# Patient Record
Sex: Female | Born: 1950 | Race: White | Hispanic: No | Marital: Married | Smoking: Current every day smoker
Health system: Southern US, Community
[De-identification: ages and names within clinical notes are randomized; demographics above are authoritative.]

## PROBLEM LIST (undated history)

## (undated) DIAGNOSIS — K743 Primary biliary cirrhosis: Secondary | ICD-10-CM

## (undated) DIAGNOSIS — R399 Unspecified symptoms and signs involving the genitourinary system: Secondary | ICD-10-CM

## (undated) DIAGNOSIS — T8859XA Other complications of anesthesia, initial encounter: Secondary | ICD-10-CM

## (undated) DIAGNOSIS — K589 Irritable bowel syndrome without diarrhea: Secondary | ICD-10-CM

## (undated) DIAGNOSIS — J302 Other seasonal allergic rhinitis: Secondary | ICD-10-CM

## (undated) DIAGNOSIS — E8881 Metabolic syndrome: Secondary | ICD-10-CM

## (undated) DIAGNOSIS — M1990 Unspecified osteoarthritis, unspecified site: Secondary | ICD-10-CM

## (undated) DIAGNOSIS — G4733 Obstructive sleep apnea (adult) (pediatric): Secondary | ICD-10-CM

## (undated) DIAGNOSIS — G43909 Migraine, unspecified, not intractable, without status migrainosus: Secondary | ICD-10-CM

## (undated) DIAGNOSIS — K769 Liver disease, unspecified: Secondary | ICD-10-CM

## (undated) DIAGNOSIS — T4145XA Adverse effect of unspecified anesthetic, initial encounter: Secondary | ICD-10-CM

## (undated) DIAGNOSIS — K5790 Diverticulosis of intestine, part unspecified, without perforation or abscess without bleeding: Secondary | ICD-10-CM

## (undated) HISTORY — PX: TONSILLECTOMY: SUR1361

## (undated) HISTORY — PX: DILATION AND CURETTAGE OF UTERUS: SHX78

## (undated) HISTORY — DX: Metabolic syndrome: E88.81

## (undated) HISTORY — PX: VULVA SURGERY: SHX837

## (undated) HISTORY — DX: Diverticulosis of intestine, part unspecified, without perforation or abscess without bleeding: K57.90

## (undated) HISTORY — DX: Irritable bowel syndrome, unspecified: K58.9

## (undated) HISTORY — PX: BREAST EXCISIONAL BIOPSY: SUR124

## (undated) HISTORY — DX: Migraine, unspecified, not intractable, without status migrainosus: G43.909

## (undated) HISTORY — DX: Metabolic syndrome: E88.810

## (undated) HISTORY — DX: Obstructive sleep apnea (adult) (pediatric): G47.33

## (undated) HISTORY — DX: Primary biliary cirrhosis: K74.3

## (undated) HISTORY — PX: COLONOSCOPY: SHX174

## (undated) HISTORY — DX: Liver disease, unspecified: K76.9

## (undated) HISTORY — DX: Other seasonal allergic rhinitis: J30.2

---

## 1979-02-28 HISTORY — PX: OTHER SURGICAL HISTORY: SHX169

## 1986-02-27 HISTORY — PX: WRIST SURGERY: SHX841

## 1988-02-28 HISTORY — PX: KNEE ARTHROSCOPY: SUR90

## 1998-11-16 ENCOUNTER — Ambulatory Visit (HOSPITAL_COMMUNITY): Admission: RE | Admit: 1998-11-16 | Discharge: 1998-11-16 | Payer: Self-pay | Admitting: Internal Medicine

## 1998-11-16 ENCOUNTER — Encounter: Payer: Self-pay | Admitting: Internal Medicine

## 1999-03-17 ENCOUNTER — Other Ambulatory Visit: Admission: RE | Admit: 1999-03-17 | Discharge: 1999-03-17 | Payer: Self-pay | Admitting: Obstetrics and Gynecology

## 1999-11-17 ENCOUNTER — Ambulatory Visit (HOSPITAL_COMMUNITY): Admission: RE | Admit: 1999-11-17 | Discharge: 1999-11-17 | Payer: Self-pay | Admitting: Obstetrics and Gynecology

## 1999-11-17 ENCOUNTER — Encounter: Payer: Self-pay | Admitting: Obstetrics and Gynecology

## 2000-12-28 ENCOUNTER — Ambulatory Visit (HOSPITAL_COMMUNITY): Admission: RE | Admit: 2000-12-28 | Discharge: 2000-12-28 | Payer: Self-pay | Admitting: Obstetrics and Gynecology

## 2000-12-28 ENCOUNTER — Encounter: Payer: Self-pay | Admitting: Obstetrics and Gynecology

## 2001-01-01 ENCOUNTER — Encounter: Admission: RE | Admit: 2001-01-01 | Discharge: 2001-01-01 | Payer: Self-pay | Admitting: Obstetrics and Gynecology

## 2001-01-01 ENCOUNTER — Encounter: Payer: Self-pay | Admitting: Obstetrics and Gynecology

## 2001-02-27 HISTORY — PX: RECTOCELE REPAIR: SHX761

## 2001-02-27 HISTORY — PX: VAGINAL HYSTERECTOMY: SUR661

## 2001-06-18 ENCOUNTER — Inpatient Hospital Stay (HOSPITAL_COMMUNITY): Admission: RE | Admit: 2001-06-18 | Discharge: 2001-06-20 | Payer: Self-pay | Admitting: Obstetrics and Gynecology

## 2001-06-18 ENCOUNTER — Encounter (INDEPENDENT_AMBULATORY_CARE_PROVIDER_SITE_OTHER): Payer: Self-pay

## 2001-07-09 ENCOUNTER — Encounter: Payer: Self-pay | Admitting: Obstetrics and Gynecology

## 2001-07-09 ENCOUNTER — Encounter: Admission: RE | Admit: 2001-07-09 | Discharge: 2001-07-09 | Payer: Self-pay | Admitting: Obstetrics and Gynecology

## 2002-01-02 ENCOUNTER — Encounter: Admission: RE | Admit: 2002-01-02 | Discharge: 2002-01-02 | Payer: Self-pay | Admitting: Obstetrics and Gynecology

## 2002-01-02 ENCOUNTER — Encounter: Payer: Self-pay | Admitting: Obstetrics and Gynecology

## 2002-07-15 ENCOUNTER — Other Ambulatory Visit: Admission: RE | Admit: 2002-07-15 | Discharge: 2002-07-15 | Payer: Self-pay | Admitting: Obstetrics and Gynecology

## 2003-08-19 ENCOUNTER — Other Ambulatory Visit: Admission: RE | Admit: 2003-08-19 | Discharge: 2003-08-19 | Payer: Self-pay | Admitting: Obstetrics and Gynecology

## 2003-09-02 ENCOUNTER — Encounter: Admission: RE | Admit: 2003-09-02 | Discharge: 2003-09-02 | Payer: Self-pay | Admitting: Obstetrics and Gynecology

## 2004-05-30 ENCOUNTER — Ambulatory Visit: Payer: Self-pay | Admitting: Internal Medicine

## 2004-07-19 ENCOUNTER — Ambulatory Visit: Payer: Self-pay | Admitting: Internal Medicine

## 2004-07-26 ENCOUNTER — Ambulatory Visit: Payer: Self-pay | Admitting: Internal Medicine

## 2004-08-12 ENCOUNTER — Ambulatory Visit: Payer: Self-pay | Admitting: Internal Medicine

## 2004-09-02 ENCOUNTER — Ambulatory Visit: Payer: Self-pay | Admitting: Internal Medicine

## 2004-09-08 ENCOUNTER — Other Ambulatory Visit: Admission: RE | Admit: 2004-09-08 | Discharge: 2004-09-08 | Payer: Self-pay | Admitting: Obstetrics and Gynecology

## 2004-09-21 ENCOUNTER — Ambulatory Visit: Payer: Self-pay | Admitting: Internal Medicine

## 2004-10-07 ENCOUNTER — Encounter: Admission: RE | Admit: 2004-10-07 | Discharge: 2004-10-07 | Payer: Self-pay | Admitting: Obstetrics and Gynecology

## 2004-10-12 ENCOUNTER — Ambulatory Visit: Payer: Self-pay | Admitting: Internal Medicine

## 2005-03-22 ENCOUNTER — Ambulatory Visit: Payer: Self-pay | Admitting: Internal Medicine

## 2005-03-29 ENCOUNTER — Ambulatory Visit: Payer: Self-pay | Admitting: Internal Medicine

## 2005-04-10 ENCOUNTER — Encounter: Admission: RE | Admit: 2005-04-10 | Discharge: 2005-07-09 | Payer: Self-pay | Admitting: Internal Medicine

## 2005-10-11 ENCOUNTER — Other Ambulatory Visit: Admission: RE | Admit: 2005-10-11 | Discharge: 2005-10-11 | Payer: Self-pay | Admitting: Obstetrics and Gynecology

## 2005-10-26 ENCOUNTER — Encounter: Admission: RE | Admit: 2005-10-26 | Discharge: 2005-10-26 | Payer: Self-pay | Admitting: Obstetrics and Gynecology

## 2005-11-01 ENCOUNTER — Encounter: Admission: RE | Admit: 2005-11-01 | Discharge: 2005-11-01 | Payer: Self-pay | Admitting: Obstetrics and Gynecology

## 2006-03-09 ENCOUNTER — Ambulatory Visit: Payer: Self-pay | Admitting: Internal Medicine

## 2006-03-09 LAB — CONVERTED CEMR LAB
ALT: 32 units/L (ref 0–40)
Chol/HDL Ratio, serum: 4.2
Creatinine,U: 72.3 mg/dL
HDL: 37.4 mg/dL — ABNORMAL LOW (ref >39.0)
Hgb A1c MFr Bld: 6.8 % — ABNORMAL HIGH (ref 4.6–6.0)
LDL Cholesterol: 80 mg/dL (ref 0–99)
Potassium: 4.2 meq/L (ref 3.5–5.1)
Triglyceride fasting, serum: 199 mg/dL — ABNORMAL HIGH (ref 0–149)
VLDL: 40 mg/dL (ref 0–40)

## 2006-03-19 ENCOUNTER — Ambulatory Visit: Payer: Self-pay | Admitting: Internal Medicine

## 2006-04-04 ENCOUNTER — Ambulatory Visit: Payer: Self-pay | Admitting: Internal Medicine

## 2006-06-26 ENCOUNTER — Encounter: Admission: RE | Admit: 2006-06-26 | Discharge: 2006-06-26 | Payer: Self-pay | Admitting: Obstetrics and Gynecology

## 2006-07-11 ENCOUNTER — Ambulatory Visit: Payer: Self-pay | Admitting: Internal Medicine

## 2006-07-11 LAB — CONVERTED CEMR LAB: Hgb A1c MFr Bld: 6.6 % — ABNORMAL HIGH (ref 4.6–6.0)

## 2006-09-19 ENCOUNTER — Other Ambulatory Visit: Admission: RE | Admit: 2006-09-19 | Discharge: 2006-09-19 | Payer: Self-pay | Admitting: Obstetrics and Gynecology

## 2006-09-24 ENCOUNTER — Encounter: Payer: Self-pay | Admitting: Internal Medicine

## 2007-02-01 ENCOUNTER — Telehealth (INDEPENDENT_AMBULATORY_CARE_PROVIDER_SITE_OTHER): Payer: Self-pay | Admitting: *Deleted

## 2007-02-04 DIAGNOSIS — G43909 Migraine, unspecified, not intractable, without status migrainosus: Secondary | ICD-10-CM | POA: Insufficient documentation

## 2007-02-04 DIAGNOSIS — Z8719 Personal history of other diseases of the digestive system: Secondary | ICD-10-CM | POA: Insufficient documentation

## 2007-02-04 DIAGNOSIS — N39498 Other specified urinary incontinence: Secondary | ICD-10-CM | POA: Insufficient documentation

## 2007-02-04 DIAGNOSIS — E1342 Other specified diabetes mellitus with diabetic polyneuropathy: Secondary | ICD-10-CM

## 2007-02-04 DIAGNOSIS — Z862 Personal history of diseases of the blood and blood-forming organs and certain disorders involving the immune mechanism: Secondary | ICD-10-CM | POA: Insufficient documentation

## 2007-02-04 DIAGNOSIS — F329 Major depressive disorder, single episode, unspecified: Secondary | ICD-10-CM | POA: Insufficient documentation

## 2007-02-04 DIAGNOSIS — Z8639 Personal history of other endocrine, nutritional and metabolic disease: Secondary | ICD-10-CM | POA: Insufficient documentation

## 2007-02-04 DIAGNOSIS — E1365 Other specified diabetes mellitus with hyperglycemia: Secondary | ICD-10-CM

## 2007-02-04 DIAGNOSIS — Z8619 Personal history of other infectious and parasitic diseases: Secondary | ICD-10-CM | POA: Insufficient documentation

## 2007-02-27 ENCOUNTER — Encounter: Admission: RE | Admit: 2007-02-27 | Discharge: 2007-02-27 | Payer: Self-pay | Admitting: Obstetrics and Gynecology

## 2007-02-28 HISTORY — PX: LASER ABLATION OF VASCULAR LESION: SHX1950

## 2007-05-29 ENCOUNTER — Encounter: Payer: Self-pay | Admitting: Internal Medicine

## 2007-08-05 ENCOUNTER — Telehealth (INDEPENDENT_AMBULATORY_CARE_PROVIDER_SITE_OTHER): Payer: Self-pay | Admitting: *Deleted

## 2007-08-21 ENCOUNTER — Telehealth (INDEPENDENT_AMBULATORY_CARE_PROVIDER_SITE_OTHER): Payer: Self-pay | Admitting: *Deleted

## 2007-09-10 ENCOUNTER — Ambulatory Visit: Payer: Self-pay | Admitting: Internal Medicine

## 2007-09-10 DIAGNOSIS — E8881 Metabolic syndrome: Secondary | ICD-10-CM | POA: Insufficient documentation

## 2007-09-10 DIAGNOSIS — K589 Irritable bowel syndrome without diarrhea: Secondary | ICD-10-CM | POA: Insufficient documentation

## 2007-09-18 ENCOUNTER — Telehealth (INDEPENDENT_AMBULATORY_CARE_PROVIDER_SITE_OTHER): Payer: Self-pay | Admitting: *Deleted

## 2007-09-19 ENCOUNTER — Encounter (INDEPENDENT_AMBULATORY_CARE_PROVIDER_SITE_OTHER): Payer: Self-pay | Admitting: *Deleted

## 2007-09-19 LAB — CONVERTED CEMR LAB
Basophils Relative: 0.2 % (ref 0.0–1.0)
Bilirubin, Direct: 0.1 mg/dL (ref 0.0–0.3)
Cholesterol: 183 mg/dL (ref 0–200)
Creatinine,U: 119.3 mg/dL
Direct LDL: 100.9 mg/dL
Eosinophils Absolute: 0.1 10*3/uL (ref 0.0–0.7)
HCT: 41.5 % (ref 36.0–46.0)
Hemoglobin: 14.3 g/dL (ref 12.0–15.0)
MCHC: 34.6 g/dL (ref 30.0–36.0)
MCV: 92 fL (ref 78.0–100.0)
Microalb, Ur: 0.7 mg/dL (ref 0.0–1.9)
Monocytes Relative: 4.9 % (ref 3.0–12.0)
Neutro Abs: 5.3 10*3/uL (ref 1.4–7.7)
Platelets: 329 10*3/uL (ref 150–400)
Potassium: 4.4 meq/L (ref 3.5–5.1)
Total CHOL/HDL Ratio: 5.4

## 2007-09-20 ENCOUNTER — Other Ambulatory Visit: Admission: RE | Admit: 2007-09-20 | Discharge: 2007-09-20 | Payer: Self-pay | Admitting: Obstetrics and Gynecology

## 2007-10-29 ENCOUNTER — Ambulatory Visit: Payer: Self-pay | Admitting: Internal Medicine

## 2008-03-05 ENCOUNTER — Encounter: Admission: RE | Admit: 2008-03-05 | Discharge: 2008-03-05 | Payer: Self-pay | Admitting: Obstetrics and Gynecology

## 2008-03-09 ENCOUNTER — Encounter: Admission: RE | Admit: 2008-03-09 | Discharge: 2008-03-09 | Payer: Self-pay | Admitting: Obstetrics and Gynecology

## 2008-06-17 ENCOUNTER — Telehealth (INDEPENDENT_AMBULATORY_CARE_PROVIDER_SITE_OTHER): Payer: Self-pay | Admitting: *Deleted

## 2008-07-16 ENCOUNTER — Ambulatory Visit: Payer: Self-pay | Admitting: Internal Medicine

## 2008-08-27 ENCOUNTER — Telehealth (INDEPENDENT_AMBULATORY_CARE_PROVIDER_SITE_OTHER): Payer: Self-pay | Admitting: *Deleted

## 2008-09-14 DIAGNOSIS — K5730 Diverticulosis of large intestine without perforation or abscess without bleeding: Secondary | ICD-10-CM | POA: Insufficient documentation

## 2008-09-17 ENCOUNTER — Ambulatory Visit: Payer: Self-pay | Admitting: Internal Medicine

## 2008-09-21 LAB — CONVERTED CEMR LAB: Tissue Transglutaminase Ab, IgA: 0.2 units (ref <7)

## 2008-09-22 ENCOUNTER — Other Ambulatory Visit: Admission: RE | Admit: 2008-09-22 | Discharge: 2008-09-22 | Payer: Self-pay | Admitting: Obstetrics and Gynecology

## 2008-09-22 ENCOUNTER — Encounter: Payer: Self-pay | Admitting: Obstetrics and Gynecology

## 2008-09-22 ENCOUNTER — Ambulatory Visit: Payer: Self-pay | Admitting: Obstetrics and Gynecology

## 2008-09-23 ENCOUNTER — Telehealth: Payer: Self-pay | Admitting: Internal Medicine

## 2008-09-29 ENCOUNTER — Ambulatory Visit: Payer: Self-pay | Admitting: Internal Medicine

## 2008-09-29 ENCOUNTER — Ambulatory Visit: Payer: Self-pay | Admitting: Obstetrics and Gynecology

## 2008-09-30 ENCOUNTER — Telehealth: Payer: Self-pay | Admitting: Internal Medicine

## 2008-09-30 LAB — CONVERTED CEMR LAB
Fecal Occult Blood: NEGATIVE
OCCULT 1: POSITIVE
OCCULT 2: POSITIVE
OCCULT 4: NEGATIVE
OCCULT 5: NEGATIVE

## 2008-10-05 ENCOUNTER — Telehealth (INDEPENDENT_AMBULATORY_CARE_PROVIDER_SITE_OTHER): Payer: Self-pay | Admitting: *Deleted

## 2008-10-06 ENCOUNTER — Ambulatory Visit: Payer: Self-pay | Admitting: Internal Medicine

## 2008-10-13 ENCOUNTER — Ambulatory Visit: Payer: Self-pay | Admitting: Internal Medicine

## 2008-10-13 LAB — HM COLONOSCOPY: HM Colonoscopy: NORMAL

## 2008-11-17 ENCOUNTER — Telehealth (INDEPENDENT_AMBULATORY_CARE_PROVIDER_SITE_OTHER): Payer: Self-pay | Admitting: *Deleted

## 2008-12-23 ENCOUNTER — Telehealth: Payer: Self-pay | Admitting: Internal Medicine

## 2009-01-25 ENCOUNTER — Telehealth (INDEPENDENT_AMBULATORY_CARE_PROVIDER_SITE_OTHER): Payer: Self-pay | Admitting: *Deleted

## 2009-01-28 ENCOUNTER — Telehealth (INDEPENDENT_AMBULATORY_CARE_PROVIDER_SITE_OTHER): Payer: Self-pay | Admitting: *Deleted

## 2009-03-01 ENCOUNTER — Telehealth (INDEPENDENT_AMBULATORY_CARE_PROVIDER_SITE_OTHER): Payer: Self-pay | Admitting: *Deleted

## 2009-03-16 ENCOUNTER — Encounter: Admission: RE | Admit: 2009-03-16 | Discharge: 2009-03-16 | Payer: Self-pay | Admitting: Obstetrics and Gynecology

## 2009-04-26 ENCOUNTER — Telehealth: Payer: Self-pay | Admitting: Internal Medicine

## 2009-05-24 ENCOUNTER — Telehealth (INDEPENDENT_AMBULATORY_CARE_PROVIDER_SITE_OTHER): Payer: Self-pay | Admitting: *Deleted

## 2009-05-27 ENCOUNTER — Ambulatory Visit: Payer: Self-pay | Admitting: Internal Medicine

## 2009-06-03 ENCOUNTER — Ambulatory Visit: Payer: Self-pay | Admitting: Internal Medicine

## 2009-06-03 DIAGNOSIS — E781 Pure hyperglyceridemia: Secondary | ICD-10-CM | POA: Insufficient documentation

## 2009-06-03 LAB — CONVERTED CEMR LAB
ALT: 45 units/L — ABNORMAL HIGH (ref 0–35)
AST: 27 units/L (ref 0–37)
Albumin: 4.1 g/dL (ref 3.5–5.2)
Alkaline Phosphatase: 100 units/L (ref 39–117)
Basophils Absolute: 0 10*3/uL (ref 0.0–0.1)
CO2: 29 meq/L (ref 19–32)
Chloride: 104 meq/L (ref 96–112)
Cholesterol: 169 mg/dL (ref 0–200)
Creatinine, Ser: 0.7 mg/dL (ref 0.4–1.2)
Direct LDL: 90.7 mg/dL
Eosinophils Absolute: 0.2 10*3/uL (ref 0.0–0.7)
GFR calc non Af Amer: 90.4 mL/min (ref >60)
Glucose, Bld: 149 mg/dL — ABNORMAL HIGH (ref 70–99)
HDL: 42.4 mg/dL (ref >39.00)
Neutro Abs: 5.1 10*3/uL (ref 1.4–7.7)
Platelets: 291 10*3/uL (ref 150.0–400.0)
Potassium: 4 meq/L (ref 3.5–5.1)
RDW: 13.9 % (ref 11.5–14.6)
Sodium: 142 meq/L (ref 135–145)
Total Bilirubin: 0.3 mg/dL (ref 0.3–1.2)
Total CHOL/HDL Ratio: 4
VLDL: 51.6 mg/dL — ABNORMAL HIGH (ref 0.0–40.0)

## 2009-06-07 ENCOUNTER — Telehealth: Payer: Self-pay | Admitting: Internal Medicine

## 2009-09-29 ENCOUNTER — Ambulatory Visit: Payer: Self-pay | Admitting: Obstetrics and Gynecology

## 2009-09-29 ENCOUNTER — Other Ambulatory Visit: Admission: RE | Admit: 2009-09-29 | Discharge: 2009-09-29 | Payer: Self-pay | Admitting: Obstetrics and Gynecology

## 2009-11-11 ENCOUNTER — Telehealth: Payer: Self-pay | Admitting: Internal Medicine

## 2009-11-26 ENCOUNTER — Encounter (INDEPENDENT_AMBULATORY_CARE_PROVIDER_SITE_OTHER): Payer: Self-pay | Admitting: *Deleted

## 2010-03-22 ENCOUNTER — Encounter
Admission: RE | Admit: 2010-03-22 | Discharge: 2010-03-22 | Payer: Self-pay | Source: Home / Self Care | Attending: Obstetrics and Gynecology | Admitting: Obstetrics and Gynecology

## 2010-03-29 NOTE — Progress Notes (Signed)
Summary: UNABLE TO ADD LABS/BLOOD TOSSED  Phone Note From Other Clinic   Summary of Call: Per Jacki Cones at lab [facility] was unable to be added to labs drawn 05-27-09 due to blood be tossed because it was old. IF A1c is still requested pt will need to come back in to have labs redrawn. Pls advise on A1c labs.................Marland KitchenFelecia Deloach CMA  June 07, 2009 1:52 PM   Follow-up for Phone Call        she can have A1c with next blood draw as scheduled (see office note) Follow-up by: Marga Melnick MD,  June 07, 2009 2:59 PM

## 2010-03-29 NOTE — Letter (Signed)
Summary: Primary Care Appointment Letter  [facility] [place]  [address] [place], [place] [address]   Phone: [phone]  Fax: [phone]    11/26/2009 MRN: [id number]  Alexa Buchanan [address] [place], [place]  [address]  Dear Ms. Hefty,   Your Primary Care Physician Marga Melnick MD has indicated that:    _______it is time to schedule an appointment.    _______you missed your appointment on______ and need to call and          reschedule.    ____X___you need to have lab work done: The following was copied from appointment on 06/03/2009, labs were due 09/2009: Patient Instructions: 1)  Please schedule a follow-up appointment in 4 months. 2)  Lipid Panel ;Vitamin D level; 3)  HbgA1C, & urine microalbumin prior to visit, ICD-9. Consume LESS THAN 40 grams of sugar/day from High Fructose Corn Syrup as #1,2 or # 3 on label.  .    _______you need to schedule an appointment discuss lab or test results.    _______you need to call to reschedule your appointment that is                       scheduled on _________.     Please call our office as soon as possible. Our phone number is [phone]. Please press option 1. Our office is open 8a-5p, Monday through Friday.     Thank you,    [facility]

## 2010-03-29 NOTE — Assessment & Plan Note (Signed)
Summary: cpx//lch   Vital Signs:  Patient profile:   60 year old female Height:      64.25 inches Weight:      231.4 pounds Temp:     98.4 degrees F oral Pulse rate:   89 / minute Resp:     16 per minute BP sitting:   104 / 68  (left arm) Cuff size:   large  Vitals Entered By: Shonna Chock (June 03, 2009 1:24 PM)  Comments REVIEWED MED LIST, PATIENT AGREED DOSE AND INSTRUCTION CORRECT    History of Present Illness: Alexa Buchanan is here for a physical; she is  asymptomatic. Her IBS has resolved with increased vitamin D , elimination of gluten, increased vegetables & decreased  sugar &carbs.  Preventive Screening-Counseling & Management  Caffeine-Diet-Exercise     Does Patient Exercise: no  Allergies: 1)  ! 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  vascular lesion resected from R  nare, Dr Dorma Russell, 6/09 Gastric Plication  Family History: Father: CLL, hyperlipidemia Mother: MS, CAD ; MGM malignant  lower neck mass of unknown etiology,DM Siblings:sister-Breast CA; son:  pilonidal cyst MGF:  CAD Maternal aunt:  porcine  heart  valve replacement Maternal uncle:  CAD Paternal aunt:  uterine CA   Social History: Occupation: PhD; Surveyor, quantity; Author Patient currently smokes: 1 ppd Daily Caffeine Use Alcohol use-yes:rarely Regular exercise-no Does Patient Exercise:  no  Review of Systems  The patient denies anorexia, fever, weight loss, weight gain, vision loss, decreased hearing, hoarseness, chest pain, syncope, dyspnea on exertion, peripheral edema, prolonged cough, headaches, hemoptysis, abdominal pain, melena, hematochezia, severe indigestion/heartburn, hematuria, incontinence, suspicious skin lesions, depression, unusual weight change, abnormal bleeding, enlarged lymph nodes, and angioedema.         On Macrodantin once daily X 5 years for recurrent UTI. DOE uphill only. Headaches controlled with breathing exercises.  Physical Exam  General:  well-nourished; alert,appropriate and cooperative throughout examination Head:  Normocephalic and atraumatic without obvious abnormalities.  Eyes:  No corneal or conjunctival inflammation noted. Perrla. Funduscopic exam benign, without hemorrhages, exudates or papilledema. Ears:  External ear exam shows no significant lesions or deformities.  Otoscopic examination reveals clear canals, tympanic membranes are intact bilaterally without bulging, retraction, inflammation or discharge. Hearing is grossly normal bilaterally. Nose:  External nasal examination shows no deformity or inflammation. Nasal mucosa are pink and moist without lesions or exudates. Mouth:  Oral mucosa and oropharynx without lesions or exudates.  Teeth in good repair. Neck:  No deformities, masses, or tenderness noted. Lungs:  Normal respiratory effort, chest expands symmetrically. Lungs are clear to auscultation, no crackles or wheezes. Heart:  Normal rate and regular rhythm. S1 and S2 normal without gallop, murmur, click, rub .S4 Abdomen:   Bowel  sounds positive,abdomen soft  but minimally tender RUQ  without masses, organomegaly or hernias noted. Genitalia:  Dr Eda Paschal Msk:  No deformity or scoliosis noted of thoracic or lumbar spine.   Pulses:  R and L carotid,radial,dorsalis pedis and posterior tibial pulses are full and equal bilaterally Extremities:  No clubbing, cyanosis, edema, or deformity noted with normal full range of motion of all joints.   Neurologic:  alert & oriented X3 and DTRs symmetrical and normal.   Skin:  Intact without suspicious lesions or rashes Cervical Nodes:  No lymphadenopathy noted Axillary Nodes:  No palpable lymphadenopathy Psych:  memory intact for recent and remote, normally interactive, and good eye contact.     Impression & Recommendations:  Problem # 1:  ROUTINE GENERAL MEDICAL EXAM@HEALTH  CARE FACL (ICD-V70.0)  Orders: EKG w/ Interpretation (93000)  Problem # 2:  HYPERTRIGLYCERIDEMIA (ICD-272.1)  Orders: EKG w/ Interpretation (93000)  Problem # 3:  IBS (ICD-564.1) improved  Problem # 4:  DYSMETABOLIC SYNDROME X (ICD-277.7)  Problem # 5:  LIVER FUNCTION TESTS, ABNORMAL, HX OF (ICD-V12.2)  Problem # 6:  Hx of MIGRAINE HEADACHE (ICD-346.90)  Problem # 7:  POSTMENOPAUSAL SYNDROME (ICD-627.9)  Complete Medication List: 1)  Clonazepam 1 Mg Tabs (Clonazepam) .... Take 1 tablet by mouth at bedtime 2)  Amaryl 2 Mg Tabs (Glimepiride) .... Take one half tablet daily 3)  Metformin Hcl 500 Mg Xr24h-tab (Metformin hcl) .Marland Kitchen.. 1 by mouth once daily, due for appointment 4)  Zomig  .... As needed 5)  Librax 2.5-5 Mg Caps (Clidinium-chlordiazepoxide) .... Take 1 tablets by mouth every morning, 1 tablet at noon, 1 tablet in the evening  Patient Instructions: 1)  Please schedule a follow-up appointment in 4 months. 2)  Lipid Panel ;Vitamin D level; 3)  HbgA1C, & urine microalbumin prior to visit, ICD-9. Consume LESS THAN 40 grams of sugar/day from High Fructose Corn Syrup as #1,2 or # 3 on  label.

## 2010-03-29 NOTE — Progress Notes (Signed)
Summary: Refill  Phone Note Call from Patient Call back at Home Phone [phone] Call back at Work Phone [phone]   Caller: Spouse Call For: Dr Juanda Chance Summary of Call: Needs her prescription for Clonazapan renewed. Therapist, music. Needs 90supply. Initial call taken by: Leanor Kail [facility],  April 26, 2009 11:27 AM    Prescriptions: CLONAZEPAM 1 MG TABS (CLONAZEPAM) Take 1 tablet by mouth at bedtime  #90 x 1   Entered by:   Hortense Ramal CMA (AAMA)   Authorized by:   Hart Carwin MD   Signed by:   Hortense Ramal CMA (AAMA) on 04/26/2009   Method used:   Printed then faxed to ...       [facility]* (mail-order)             ,          Ph: [phone]       Fax: [phone]   RxID:   [id number]

## 2010-03-29 NOTE — Progress Notes (Signed)
Summary: refill request  Phone Note Refill Request Message from:  Fax from Pharmacy on March 01, 2009 1:14 PM  Refills Requested: Medication #1:  AMARYL 2 MG TABS take one half tablet daily   Dosage confirmed as above?Dosage Confirmed   Brand Name Necessary? No   Supply Requested: 3 months Glimepiride tabs  2 mg, 90 day supply , take 1/2 tablet daily,   Initial call taken by: Michaelle Copas,  March 01, 2009 1:20 PM  Follow-up for Phone Call        Which pharmacy? Local or mail order, patient gets 90days supply sent to both. Please clarify which [facility] this RX came from Follow-up by: Shonna Chock,  March 01, 2009 2:50 PM  Additional Follow-up for Phone Call Additional follow up Details #1::        This was a fax from [facility] order.Michaelle Copas  March 01, 2009 2:58 PM      Prescriptions: AMARYL 2 MG TABS (GLIMEPIRIDE) take one half tablet daily  #90 x 0   Entered by:   Shonna Chock   Authorized by:   Marga Melnick MD   Signed by:   Shonna Chock on 03/01/2009   Method used:   Faxed to ...       [facility]* (mail-order)             ,          Ph: [phone]       Fax: [phone]   RxID:   [id number]

## 2010-03-29 NOTE — Progress Notes (Signed)
Summary: Refill Request/Appointment Due  Phone Note Refill Request Message from:  Pharmacy on [facility] Fax #: [phone]  Refills Requested: Medication #1:  METFORMIN HCL 500 MG XR24H-TAB 1 by mouth once daily   Dosage confirmed as above?Dosage Confirmed   Supply Requested: 3 months Next Appointment Scheduled: none Initial call taken by: Harold Barban,  May 24, 2009 8:51 AM  Follow-up for Phone Call        RX was faxed to [facility].  **Please contact patient to schedule a yearly exam and fasting labs**   This can be to separate appointments of one. Follow-up by: Shonna Chock,  May 24, 2009 11:16 AM  Additional Follow-up for Phone Call Additional follow up Details #1::        Patient made an appt for 4.7.11 @ 1pm for her yearly exam Additional Follow-up by: Harold Barban,  May 24, 2009 11:47 AM    New/Updated Medications: METFORMIN HCL 500 MG XR24H-TAB (METFORMIN HCL) 1 by mouth once daily, DUE FOR APPOINTMENT Prescriptions: METFORMIN HCL 500 MG XR24H-TAB (METFORMIN HCL) 1 by mouth once daily, DUE FOR APPOINTMENT  #90 x 0   Entered by:   Shonna Chock   Authorized by:   Marga Melnick MD   Signed by:   Shonna Chock on 05/24/2009   Method used:   Print then Give to Patient   RxID:   [id number]

## 2010-03-29 NOTE — Progress Notes (Signed)
Summary: med refill  Phone Note Call from Patient Call back at Work Phone [phone]   Caller: Patient Call For: Dr. Juanda Chance Reason for Call: Refill Medication Summary of Call: needs a renewed rx for Clonazapam... [facility] Initial call taken by: Vallarie Mare,  November 11, 2009 3:20 PM  Follow-up for Phone Call        Prescription sent.  Follow-up by: Lamona Curl CMA (AAMA),  November 11, 2009 3:28 PM    New/Updated Medications: CLONAZEPAM 1 MG TABS (CLONAZEPAM) Take 1 tablet by mouth at bedtime. NEED OFFICE VISIT FOR FURTHER REFILLS! Prescriptions: CLONAZEPAM 1 MG TABS (CLONAZEPAM) Take 1 tablet by mouth at bedtime. NEED OFFICE VISIT FOR FURTHER REFILLS!  #90 x 0   Entered by:   Lamona Curl CMA (AAMA)   Authorized by:   Hart Carwin MD   Signed by:   Lamona Curl CMA (AAMA) on 11/11/2009   Method used:   Printed then faxed to ...       [facility]* (retail)             ,          Ph: [phone]       Fax: [phone]   RxID:   [id number]

## 2010-04-20 ENCOUNTER — Encounter: Payer: Self-pay | Admitting: Internal Medicine

## 2010-04-21 ENCOUNTER — Ambulatory Visit (INDEPENDENT_AMBULATORY_CARE_PROVIDER_SITE_OTHER): Payer: 59 | Admitting: Internal Medicine

## 2010-04-21 ENCOUNTER — Encounter: Payer: Self-pay | Admitting: Internal Medicine

## 2010-04-21 DIAGNOSIS — N390 Urinary tract infection, site not specified: Secondary | ICD-10-CM | POA: Insufficient documentation

## 2010-04-21 DIAGNOSIS — M5412 Radiculopathy, cervical region: Secondary | ICD-10-CM

## 2010-04-21 DIAGNOSIS — E119 Type 2 diabetes mellitus without complications: Secondary | ICD-10-CM

## 2010-04-21 LAB — CONVERTED CEMR LAB
Glucose, Urine, Semiquant: NEGATIVE
Ketones, urine, test strip: NEGATIVE
Nitrite: NEGATIVE
Specific Gravity, Urine: 1.02
Urobilinogen, UA: 0.2

## 2010-04-22 ENCOUNTER — Encounter: Payer: Self-pay | Admitting: Internal Medicine

## 2010-04-25 ENCOUNTER — Telehealth (INDEPENDENT_AMBULATORY_CARE_PROVIDER_SITE_OTHER): Payer: Self-pay | Admitting: *Deleted

## 2010-04-26 NOTE — Assessment & Plan Note (Signed)
Summary: UTI/RH.....   Vital Signs:  Patient profile:   60 year old female Height:      84.25 inches Weight:      222 pounds Temp:     98.3 degrees F oral Pulse rate:   72 / minute Resp:     14 per minute BP sitting:   130 / 76  (left arm)  Vitals Entered By: Jeremy Johann CMA (April 21, 2010 12:29 PM) CC: burinig with urination, Dysuria, Type 2 diabetes mellitus follow-up   CC:  burinig with urination, Dysuria, and Type 2 diabetes mellitus follow-up.  History of Present Illness:    Onset 04/18/2010  as dysuria; she now  denies urinary frequency, urgency, hematuria, vaginal discharge, and vaginal itching.  Associated symptoms include fever, shaking chills, and arthralgias 02/22.  The patient denies the following associated symptoms: nausea, vomiting, flank pain, abdominal pain, back pain, and pelvic pain.  PM History is significant for > 3 UTIs in one year; she  had been  on Macrodantin suppression from Dr Logan Bores until 2010.                                                                                           She has had RUE pain X 6 months( see ROS).NSAIDS did not help.Orthopedic assessment  has been considered.     Type 2 Diabetes Mellitus Follow-Up in context of acute  UTI. She   reports weight loss of 12 # on low carb  & Gluten free diet. She  denies polydipsia , blurred vision,or  vision loss.  Since the last visit the patient reports not monitoring blood glucose as her  machine  has not worked for 6-8 months. New glucometer provided with Rx for strips.  Current Medications (verified): 1)  Clonazepam 1 Mg Tabs (Clonazepam) .... Take 1 Tablet By Mouth At Bedtime. Need Office Visit For Further Refills! 2)  Amaryl 2 Mg Tabs (Glimepiride) .... Take One Half Tablet Daily 3)  Metformin Hcl 500 Mg Xr24h-Tab (Metformin Hcl) .Marland Kitchen.. 1 By Mouth Once Daily 4)  Zomig .... As Needed 5)  Librax 2.5-5 Mg Caps (Clidinium-Chlordiazepoxide) .... Take 2 Tablets By Mouth Every Morning, 2 Tablet At  Noon, 1 Tablet in The Evening  Allergies (verified): 1)  ! Pcn  Review of Systems Neuro:  Denies numbness and tingling; Constant pain X 6 months in R trapezius to R elbow , better with change in computer equipment  & massage.Marland Kitchen  Physical Exam  General:  in no acute distress; alert,appropriate and cooperative throughout examination Neck:  Full ROM but pain RU back with R lateral rotation of neck Abdomen:  Bowel sounds positive,abdomen soft and non-tender without masses, organomegaly or hernias noted. Msk:  No flank tenderness to percussion Extremities:  No clubbing, cyanosis, edema, or deformity noted . No pain to palpation of RUE Neurologic:  strength normal in  upper  extremities and DTRs symmetrical and normal.   Skin:  Intact without suspicious lesions or rashes Cervical Nodes:  No lymphadenopathy noted Axillary Nodes:  No palpable lymphadenopathy Psych:  memory intact for recent and remote, normally interactive, and good eye contact.  Impression & Recommendations:  Problem # 1:  UTI (ICD-599.0) R/O DM exacerbation with acute infection Orders: UA Dipstick w/o Micro (manual) ([address]109) T-Culture, Urine ([address]454-09811)  Her updated medication list for this problem includes:    Ciprofloxacin Hcl 500 Mg Tabs (Ciprofloxacin hcl) .Marland Kitchen... 1 two times a day  Problem # 2:  CERVICAL RADICULOPATHY, RIGHT (ICD-723.4)  Problem # 3:  DM (ICD-250.00)  ? status as no monitor; purposeful weight loss may have improved control Her updated medication list for this problem includes:    Amaryl 2 Mg Tabs (Glimepiride) .Marland Kitchen... Take one half tablet daily    Metformin Hcl 500 Mg Xr24h-tab (Metformin hcl) .Marland Kitchen... 1 by mouth once daily  Orders: Venipuncture (91478) TLB-A1C / Hgb A1C (Glycohemoglobin) (83036-A1C)  Complete Medication List: 1)  Clonazepam 1 Mg Tabs (Clonazepam) .... Take 1 tablet by mouth at bedtime. need office visit for further refills! 2)  Amaryl 2 Mg Tabs (Glimepiride) .... Take one  half tablet daily 3)  Metformin Hcl 500 Mg Xr24h-tab (Metformin hcl) .Marland Kitchen.. 1 by mouth once daily 4)  Zomig  .... As needed 5)  Librax 2.5-5 Mg Caps (Clidinium-chlordiazepoxide) .... Take 2 tablets by mouth every morning, 2 tablet at noon, 1 tablet in the evening 6)  Gabapentin 100 Mg Caps (Gabapentin) .Marland Kitchen.. 1 every 8 hrs as needed 7)  Ciprofloxacin Hcl 500 Mg Tabs (Ciprofloxacin hcl) .Marland Kitchen.. 1 two times a day  Patient Instructions: 1)  Check your blood sugars regularly. If your readings are usually above : 150 or below 90 you should contact our office. 2)  See your eye doctor yearly to check for diabetic eye damage. 3)  Check your feet each night for sore areas, calluses or signs of infection. Imaging & possible Neurosurgical imaging may be necessary if RUE symptoms are no better with Gabapentin. Prescriptions: CLONAZEPAM 1 MG TABS (CLONAZEPAM) Take 1 tablet by mouth at bedtime. NEED OFFICE VISIT FOR FURTHER REFILLS!  #90 x 0   Entered and Authorized by:   Marga Melnick MD   Signed by:   Marga Melnick MD on 04/21/2010   Method used:   Print then Give to Patient   RxID:   [id number] CIPROFLOXACIN HCL 500 MG TABS (CIPROFLOXACIN HCL) 1 two times a day  #20 x 0   Entered and Authorized by:   Marga Melnick MD   Signed by:   Marga Melnick MD on 04/21/2010   Method used:   Electronically to        [facility]* (retail)       [address]       [place], [place]  [address]       Ph: [phone]       Fax: [phone]   RxID:   [id number] GABAPENTIN 100 MG CAPS (GABAPENTIN) 1 every 8 hrs as needed  #30 x 1   Entered and Authorized by:   Marga Melnick MD   Signed by:   Marga Melnick MD on 04/21/2010   Method used:   Electronically to        [facility]* (retail)       [address]       [place], [place]  [address]       Ph: [phone]       Fax: [phone]   RxID:   [id number]    Orders Added: 1)  UA Dipstick w/o Micro (manual)  [81002] 2)  T-Culture, Urine [[address]07-37106] 3)  Est. Patient Level III [26948] 4)  Venipuncture [  36415] 5)  TLB-A1C / Hgb A1C (Glycohemoglobin) [83036-A1C]    Laboratory Results   Urine Tests    Routine Urinalysis   Color: yellow Appearance: Clear Glucose: negative   (Normal Range: Negative) Bilirubin: negative   (Normal Range: Negative) Ketone: negative   (Normal Range: Negative) Spec. Gravity: 1.020   (Normal Range: 1.003-1.035) Blood: large   (Normal Range: Negative) pH: 5.0   (Normal Range: 5.0-8.0) Protein: negative   (Normal Range: Negative) Urobilinogen: 0.2   (Normal Range: 0-1) Nitrite: negative   (Normal Range: Negative) Leukocyte Esterace: moderate   (Normal Range: Negative)        Appended Document: UTI/RH...Marland KitchenMarland Kitchen    Prescriptions: ONETOUCH DELICA LANCETS  MISC (LANCETS) test once daily  #3 month x 2   Entered by:   Jeremy Johann CMA   Authorized by:   Marga Melnick MD   Signed by:   Jeremy Johann CMA on 04/23/2010   Method used:   Faxed to ...       [facility]* (retail)             ,          Ph: [phone]       Fax: [phone]   RxID:   [id number] ONETOUCH ULTRA BLUE  STRP (GLUCOSE BLOOD) test once daily  #3 month x 2   Entered by:   Jeremy Johann CMA   Authorized by:   Marga Melnick MD   Signed by:   Jeremy Johann CMA on 04/23/2010   Method used:   Faxed to ...       [facility]* (retail)             ,          Ph: [phone]       Fax: [phone]   RxID:   [id number] Dola Argyle LANCETS  MISC (LANCETS) test once daily  #1 month x 0   Entered by:   Jeremy Johann CMA   Authorized by:   Marga Melnick MD   Signed by:   Jeremy Johann CMA on 04/23/2010   Method used:   Faxed to ...       [facility]* (retail)       [address]       [place], [place]  [address]       Ph: [phone]       Fax: [phone]   RxID:   [id number] [facility] ULTRA BLUE  STRP (GLUCOSE BLOOD) test  once daily  #1 month x 0   Entered by:   Jeremy Johann CMA   Authorized by:   Marga Melnick MD   Signed by:   Jeremy Johann CMA on 04/23/2010   Method used:   Faxed to ...       [facility]* (retail)       [address]       [place], [place]  [address]       Ph: [phone]       Fax: [phone]   RxID:   [id number]

## 2010-05-03 ENCOUNTER — Encounter: Payer: Self-pay | Admitting: Internal Medicine

## 2010-05-05 NOTE — Letter (Signed)
Summary: Care Considerations/[employer]  Care Considerations/[employer]   Imported By: Maryln Gottron 04/29/2010 09:15:28  _____________________________________________________________________  External Attachment:    Type:   Image     Comment:   External Document

## 2010-05-05 NOTE — Progress Notes (Signed)
Summary: Culture Results  Phone Note Outgoing Call Call back at Home Phone [phone]   Call placed by: Shonna Chock CMA,  April 25, 2010 1:49 PM Call placed to: Patient Details for Reason: Culture results  Summary of Call: Spoke with patient,  If symptoms persist or progress Dr Logan Bores or Dr Eda Paschal should be consulted as surprisingly  no bacterial infection documented. Hopp Patient agrees and states she has seen both Dr.'s several times and will follow-up with them if symotms do NOT improve Shonna Chock CMA  April 25, 2010 1:50 PM

## 2010-05-31 ENCOUNTER — Other Ambulatory Visit: Payer: Self-pay

## 2010-06-01 ENCOUNTER — Other Ambulatory Visit (INDEPENDENT_AMBULATORY_CARE_PROVIDER_SITE_OTHER): Payer: 59

## 2010-06-01 DIAGNOSIS — N959 Unspecified menopausal and perimenopausal disorder: Secondary | ICD-10-CM

## 2010-06-01 DIAGNOSIS — E119 Type 2 diabetes mellitus without complications: Secondary | ICD-10-CM

## 2010-06-01 DIAGNOSIS — Z0000 Encounter for general adult medical examination without abnormal findings: Secondary | ICD-10-CM

## 2010-06-01 DIAGNOSIS — E785 Hyperlipidemia, unspecified: Secondary | ICD-10-CM

## 2010-06-01 LAB — CBC WITH DIFFERENTIAL/PLATELET
Basophils Absolute: 0 10*3/uL (ref 0.0–0.1)
Basophils Relative: 0.5 % (ref 0.0–3.0)
Hemoglobin: 14.8 g/dL (ref 12.0–15.0)
MCV: 94.2 fl (ref 78.0–100.0)
Monocytes Absolute: 0.5 10*3/uL (ref 0.1–1.0)
Monocytes Relative: 5.5 % (ref 3.0–12.0)
Neutro Abs: 5.3 10*3/uL (ref 1.4–7.7)
Platelets: 260 10*3/uL (ref 150.0–400.0)
RDW: 14.4 % (ref 11.5–14.6)

## 2010-06-02 LAB — VITAMIN D 25 HYDROXY (VIT D DEFICIENCY, FRACTURES): Vit D, 25-Hydroxy: 27 ng/mL — ABNORMAL LOW (ref 30–89)

## 2010-06-04 LAB — GLUCOSE, CAPILLARY
Glucose-Capillary: 137 mg/dL — ABNORMAL HIGH (ref 70–99)
Glucose-Capillary: 141 mg/dL — ABNORMAL HIGH (ref 70–99)

## 2010-06-16 ENCOUNTER — Encounter: Payer: Self-pay | Admitting: Internal Medicine

## 2010-06-16 ENCOUNTER — Ambulatory Visit (INDEPENDENT_AMBULATORY_CARE_PROVIDER_SITE_OTHER): Payer: 59 | Admitting: Internal Medicine

## 2010-06-16 DIAGNOSIS — Z0000 Encounter for general adult medical examination without abnormal findings: Secondary | ICD-10-CM

## 2010-06-16 DIAGNOSIS — E781 Pure hyperglyceridemia: Secondary | ICD-10-CM

## 2010-06-16 DIAGNOSIS — E119 Type 2 diabetes mellitus without complications: Secondary | ICD-10-CM

## 2010-06-16 DIAGNOSIS — Z23 Encounter for immunization: Secondary | ICD-10-CM

## 2010-06-16 MED ORDER — TETANUS-DIPHTH-ACELL PERTUSSIS 5-2.5-18.5 LF-MCG/0.5 IM SUSP
0.5000 mL | Freq: Once | INTRAMUSCULAR | Status: AC
  Administered 2010-06-16: 0.5 mL via INTRAMUSCULAR

## 2010-06-16 NOTE — Patient Instructions (Signed)
Preventive Health Care: Exercise  30-45  minutes a day, 3-4 days a week. Walking is especially valuable in preventing Osteoporosis. Eat a low-fat diet with lots of fruits and vegetables, up to 7-9 servings per day. Avoid obesity; your goal = waist less than 35 inches.Consume less than 30 grams of sugar per day from foods & drinks with High Fructose Corn Syrup as #2,3 or #4 on label. Eye Doctor - have an eye exam @ least annually Diabetes Monitor   The A1c test checks the average amount of glucose (sugar) in the blood over the last 2 to 3 months.As glucose circulates in the blood, some of it binds to hemoglobin A. This is the main form of hemoglobin in adults. Hemoglobin is a red protein that carries oxygen in the red blood cells (RBC's). Once the glucose is bound to the hemoglobin A, it remains there for the life of the red blood cell (about 120 days). This combination of glucose and hemoglobin A is called A1c (or hemoglobin A1c or glycohemoglobin). Increased glucose in the blood, increases the hemoglobin A1c. A1c levels do not change quickly but will shift as older RBC's die and younger ones take their place.   The A1c test is used primarily to monitor the glucose control of diabetics over time. The goal of those with diabetes is to keep their blood glucose levels as close to normal as possible. This helps to minimize the complications caused by chronically elevated glucose levels, such as progressive damage to body organs like the kidneys, eyes, cardiovascular system, and nerves. The A1c test gives a picture of the average amount of glucose in the blood over the last few months. It can help a patient and his doctor know if the measures they are taking to control the patient's diabetes are successful or need to be adjusted.     Depending on the type of diabetes that you have, how well your diabetes is controlled, your A1c may be measured 2 to 4 times each year. When someone is first diagnosed with  diabetes or if control is not good, A1c may be ordered more frequently.   NORMAL VALUES  Non diabetic adults: 5 %-6.1%  Good diabetic control: 6.2-6.4 %  Fair diabetic control: 6.5-7%  Poor diabetic control: greater than 7 % ( except with additional factors such as  advanced age; significant coronary or neurologic disease,etc). Check the A1c every 6 months if it is < 6.5%; every 4 months if  6.5% or higher. Goals for home glucose monitoring are : fasting  or morning glucose goal of  90-150. Two hours after any meal , goal = < 180, preferably < 150.      Check fasting lipids & A1c in 4 months(250.00)

## 2010-06-16 NOTE — Progress Notes (Signed)
Subjective:    Patient ID: Alexa Buchanan, female    DOB: 14-Feb-1951, 60 y.o.   MRN: [id number]  HPI She is here for a physical; she  has lost 21# with a  low carb diet.                                                                                                                             Diabetes status assessment :Fasting or morning glucose range:  120-140   ;   Highest glucose 2 hours after any meal:  < 100 ;  Hypoglycemia :  no  .                                                           Excess thirst :no;  Excess hunger:  no ;  Excess urination:  no .                                            Lightheadedness with standing:  Only with her vertigo ; Chest pain:  no ; Palpitations :no  ;  Pain in  calves with walking:   no .                                                                                                                                                              Non healing skin  ulcers or sores,especially over the feet:  no ; Numbness or tingling or burning in feet : no.  Significant change in  Weight : see above  ;Vision changes : no                                                                       Exercise : minimal ;  Nutrition/diet:  Low carb, low  gluten;   Medication compliance : yes ; Medication adverse  Effects:  no  ;  Eye exam : due ; Foot care : no ;  A1c/ urine microalbumin monitor:  6.4%    Review of Systems Patient reports no vision  changes, adenopathy,fever,   persistant / recurrent hoarseness , swallowing issues, chest pain,palpitations,edema,persistant /recurrent cough, hemoptysis, dyspnea( rest/ exertional/paroxysmal nocturnal), melena, abdominal pain, significant heartburn, bowel changes,GU symptoms(dysuria, hematuria,pyuria, incontinence) ), Gyn symptoms(abnormal  bleeding , pain),  syncope, focal weakness, memory  loss, skin/hair /nail changes,abnormal bruising or bleeding, anxiety,or depression. Rectal bleeding evaluated with colonoscopy 2011.     Objective:   Physical Exam  Gen.: Healthy and well-nourished in appearance. Alert, appropriate and cooperative throughout exam. Head: Normocephalic without obvious abnormalities Eyes: No corneal or conjunctival inflammation noted. Pupils equal round reactive to light and accommodation. Fundal exam is benign without hemorrhages, exudate, papilledema. Extraocular motion intact. Ears: hearing aids bilaterally Nose: External nasal exam reveals no deformity or inflammation. Nasal mucosa are pink and moist. No lesions or exudates noted. Mouth: Oral mucosa and oropharynx reveal no lesions or exudates. Teeth in good repair. Neck: No deformities, masses, or tenderness noted. Range of motion  normal. Thyroid  normal. Lungs: Normal respiratory effort; chest expands symmetrically. Lungs are clear to auscultation without rales, wheezes, or increased work of breathing.BS ecreased slightly Heart: Normal rate and rhythm. Normal S1 and S2. No gallop, click, or rub. No  murmur. Abdomen: Bowel sounds normal; abdomen soft and nontender. No masses, organomegaly or hernias noted. Genitalia: Dr Eda Paschal                                                                                     Musculoskeletal/extremities: No deformity or scoliosis noted of  the thoracic or lumbar spine. No clubbing, cyanosis, edema, or deformity noted. Range of motion  normal .Tone & strength  normal.Joints normal. Nail health  Good. Minimal crepitus of knees Vascular: Carotid, radial artery, dorsalis pedis and dorsalis posterior tibial pulses are full and equal. No bruits present. Neurologic: Alert and oriented x3. Deep tendon reflexes symmetrical and normal.         Skin: Intact without suspicious lesions or rashes. Lymph: No cervical, axillary, or inguinal lymphadenopathy present. Psych: Mood and affect are  normal. Normally interactive  Assessment & Plan:  #1 comprehensive physical ; no acute issues  #2 diabetes, excellent control  #3 dyslipidemia  Plan: #1 the advance cholesterol panel ([place] heart panel) close disease was reviewed in detail. The major risk is elevated triglycerides.  Because of the significant improvement in her diabetes and weight loss; I would recommend continuing the present regimen and recheck a routine fasting cholesterol profile in 4-6 months. If the triglycerides do not decrease significantly; and would recommend medication.

## 2010-06-20 ENCOUNTER — Encounter: Payer: Self-pay | Admitting: Internal Medicine

## 2010-07-15 NOTE — Discharge Summary (Signed)
[facility]  Patient:    Alexa Buchanan, Alexa Buchanan Visit Number: [id number] MRN: [id number]          Service Type: GYN Location: 4W 0450 01 Attending Physician:  Sharon Mt Dictated by:   Rande Brunt. Eda Paschal, M.D. Admit Date:  06/18/2001 Discharge Date: 06/20/2001   CC:         Jamison Neighbor, M.D.   Discharge Summary  HOSPITAL COURSE:  The patient is a 60 year old female who was admitted to the hospital with uterine prolapse and urinary stress incontinence, as well as a rectocele for definitive surgery.  On the day of admission, she was taken to the operating room where a vaginal hysterectomy, bilateral salpingo-oophorectomy, pubovaginal sling, and posterior repair were done. Postoperatively, she did well.  Initially, she had a little bit of a fever but it responded to incentive spirometry and ambulation.  By the second postoperative day, she was ready for discharge.  DISCHARGE MEDICATIONS:  She was discharged on Lorcet 10 for pain relief and Cipro 250 mg b.i.d. to cover her suprapubic catheter.  She will reinitiate her Estradiol 1 mg daily but not her Prometrium.  FOLLOWUP:  She will be seen in the office by Dr. Logan Bores next week and will be seen by Dr. Eda Paschal in three weeks.  LABORATORY DATA:  Laboratory data is in the chart.  Final pathology report revealed all was benign.  CONDITION ON DISCHARGE:  Improved.  DISCHARGE DIET:  Regular.  DISCHARGE ACTIVITY:  Ambulatory.  DISCHARGE DIAGNOSES: 1. Uterine prolapse. 2. Urinary stress incontinence. 3. Rectocele.  OPERATION:  Vaginal hysterectomy, bilateral salpingo-oophorectomy, pubovaginal sling, posterior repair. Dictated by:   Rande Brunt. Eda Paschal, M.D. Attending Physician:  Sharon Mt DD:  06/20/01 TD:  06/20/01 Job: [id number]

## 2010-07-15 NOTE — H&P (Signed)
[facility]  Patient:    MAY, MANRIQUE Visit Number: [id number] MRN: [id number]          Service Type: GYN Location: 1S X002 01 Attending Physician:  Sharon Mt Dictated by:   Rande Brunt. Eda Paschal, M.D. Admit Date:  06/18/2001                           History and Physical  CHIEF COMPLAINT:              Uterine prolapse, urinary stress incontinence, rectocele.  HISTORY OF PRESENT ILLNESS:   The patient is a 60 year old gravida 3 para 2 AB 1, with a progressively increasing history of severe urinary stress incontinence.  Please see Dr. Marcelyn Bruins notes associated with symptomatic uterine prolapse.  She now enters the hospital for correction of the above. She desires to have her ovaries removed because she is postmenopausal and she is concerned about ovarian cancer down the road.  She wants me to make an incision if I cannot remove her ovaries vaginally.  Our plan will be to do a vaginal hysterectomy and bilateral salpingo-oophorectomy.  Dr. Marcelyn Bruins will do a sling procedure and I will repair her rectocele.  We will make an incision as noted above if necessary.  PAST MEDICAL HISTORY:         1. Surgery on her wrist for tendons.                               2. Previous D&C.  PRESENT MEDICATIONS:          1. Estradiol 1 mg q.d.                               2. Prometrium 100 mg q.d.                               3. Dyazide taken on a p.r.n. basis for                                  fluid retention.                               4. Inderal 80 mg q.d. for migraines.                               5. Dalmane 30 mg as needed for sleep.                               6. Wellbutrin SR 150 mg one b.i.d.                               7. Zomig 5 mg as needed for migraines.                               8. She also takes adequate calcium.  ALLERGIES:  She is allergic to no drugs except for AUGMENTIN.  FAMILY  HISTORY:               Strong coronary artery disease history on her mothers side of her family.  Sister with breast cancer.  Father with chronic lymphocytic leukemia.  Mother is hypertensive, also has MS.  Maternal grandmother is diabetic.  SOCIAL HISTORY:               She smokes one pack of cigarettes per day.  She rarely uses alcohol.  REVIEW OF SYSTEMS:            HEENT: Negative except for migraines.  CARDIAC: Negative.  GI: Frequent gas problems.  GU: See above.  NEUROMUSCULAR: Negative.  ENDOCRINE: Negative.  PHYSICAL EXAMINATION:  GENERAL:                      The patient is a well-developed, well-nourished female in no acute distress.  VITAL SIGNS:                  Blood pressure 122/80, pulse 80 and regular, respirations 16 and unlabored.  She is afebrile.  HEENT:                        Within normal limits.  NECK:                         Supple.  Trachea midline. Thyroid not enlarged.  LUNGS:                        Clear to P&A.  HEART:                        No thrills, heaves, or murmurs.  BREAST:                       No masses.  ABDOMEN:                      Soft, without guarding, rebound, or masses.  PELVIC:                       External is within normal limits.  BUS is within normal limits.  Bladder shows loss of urethrovesical angle.  Vagina shows first degree rectocele.  Cervix is clean.  Pap smear is normal.  Uterus is retroverted.  Normal size and shape, with second degree uterine descensus. Adnexa not palpably enlarged.  Anus and perineum normal.  RECTAL:                       Negative.  ADMISSION IMPRESSION:         1. Significant symptomatic uterine prolapse.                               2. Rectocele.                               3. Urinary stress incontinence.  PLAN:                         See above. Dictated by:   Rande Brunt. Eda Paschal, M.D. Attending  Physician:  Sharon Mt DD:  06/18/01 TD:  06/18/01 Job: [phone] [id number]

## 2010-07-15 NOTE — Op Note (Signed)
[facility]  Patient:    Alexa Buchanan, Alexa Buchanan Visit Number: [id number] MRN: [id number]          Service Type: GYN Location: 4W 0456 01 Attending Physician:  Sharon Mt Dictated by:   Jamison Neighbor, M.D. Proc. Date: 06/18/01 Admit Date:  06/18/2001   CC:         Reuel Boom L. Eda Paschal, M.D.   Operative Report  PREOPERATIVE DIAGNOSES: 1. General prolapse. 2. Stress urinary incontinence.  POSTOPERATIVE DIAGNOSES: 1. General prolapse. 2. Stress urinary incontinence.  PROCEDURE: 1. Vaginal hysterectomy including bilateral salpingo-oophorectomy. 2. Cystoscopy, pubovaginal sling, and suprapubic tube placement. 3. Rectocele.  SURGEONS: 1. Daniel L. Gottsegen, M.D. (procedures 1 and 3). 2. Jamison Neighbor, M.D. (procedure 2).  ASSISTANT:  Katy Fitch, M.D.  Horace PorteousHanley Ben cystocath.  COMPLICATIONS:  None.  BRIEF HISTORY:  This 60 year old female is noted to have uterine prolapse along with associated rectocele but only a fairly modest cystocele.  The patient also has associated stress incontinence.  The patients urodynamic evaluation did reveal some degree of urgency, but she did not have significant high pressure uncontrolled contractions.  She has diminished leak point pressure which would suggest that she would benefit for surgical repair of her stress incontinence.  The patient is scheduled to undergo vaginal hysterectomy including bilateral salpingo-oophorectomy and requested that a pubovaginal sling be placed simultaneously.  The patient understands the risks and benefits including the possibility of being either under or overcorrected. Full and informed consent was obtained.  DESCRIPTION OF PROCEDURE:  After the successful induction of general anesthesia, the patient was placed in the dorsal lithotomy position and prepped with Betadine and draped in the usual sterile fashion.  A vaginal hysterectomy and bilateral  salpingo-oophorectomy was performed by Dr. Eda Paschal with the assistance of Dr. Penni Homans.  The patients cuff was completely closed.  The patient was not felt to have a significant cystocele, and it was felt that the sling itself would repair the anterior compartment. The patient was noted under anesthesia to have a fairly substantial rectocele and for that reason, it was felt that following the placement of the sling, a rectocele repair would need to be completed.  The vaginal hysterectomy had been completed by Dr. Eda Paschal prior to the initiation of the pubovaginal sling surgery.  The patient was repositioned so that the lithotomy position was somewhat lower, and the anterior vaginal mucosa was infiltrated with local anesthetic including epinephrine.  While that was setting up, a small transverse incision approximately 1 cm in width was made directly above the pubic bone.  This was carried down sharply until the fascia was identified.  Hemostasis was obtained with electrocautery and antibiotic-soaked pack was placed within that small incision.  The anterior vaginal mucosa was opened, beginning at the mid urethra, extending back part way down towards the cuff.  A flap was raised bilaterally by dissecting just underneath the vaginal mucosa.  The space of Retzius was entered on each side and mobilized.  The sling was then constructed from a piece of Tutoplast treated fascia, measuring 2 x 7.  The ends were oversewn with #1 nylon.  The sling was pulled from the bottom incision to the top incision using tonsil clamps.  The sling was tacked down with several sutures of 2-0 Vicryl to ensure that it did not roll.  The sling extended from the mid urethral complex back beyond the bladder neck, giving excellent support to the urethra.  The anterior vaginal mucosa was then  trimmed and closed.  Cystoscopy was performed.  The bladder was carefully inspected.  It was free of any tumor or stones.  Both  ureteral orifices were normal in configuration and location.  Careful inspection with both 12 degree and 70 degree lenses showed that there was no injury to the bladder. Specifically, no area where the suture material or the sling had entered the bladder.  The bladder was filled and under direct vision, a suprapubic tube was then placed.  This was sutured in place with 3-0 nylon sutures.  The sling was then tied down with an appropriate degree of tension.  When it was tied down, two fingerbreadths could still be placed underneath the nylon suture. The cystoscope still had 30-45 degrees of play.  The urethra was in a neutral position with no angulation.  Visual inspection showed that there was good coaptation but no angulation of the urethra.  With a full bladder, there was no loss of urine but with a crede maneuver, there was a straight, prompt flow indicating that the tension was appropriate.  Final inspection showed that the suprapubic tube was well-positioned, and the bladder neck appeared to be in appropriate location.  The sling was then completely tied down.  The top incision was irrigated with antibiotic solution and closed with a 2-0 Vicryl and surgical clips.  At the end of Dr. Delorise Royals rectocele repair, a dressing was applied to the suprapubic tube, and a pack was placed.  Prior to Dr. Eda Paschal returning for the rectocele, a small area in the vaginal mucosa that had been entered by a retractor was identified, and this was closed with several Vicryl sutures. Dictated by:   Jamison Neighbor, M.D. Attending Physician:  Sharon Mt DD:  06/18/01 TD:  06/18/01 Job: [id number]

## 2010-07-15 NOTE — Op Note (Signed)
[facility]  Patient:    Alexa Buchanan, Alexa Buchanan Visit Number: [id number] MRN: [id number]          Service Type: GYN Location: 1S X002 01 Attending Physician:  Sharon Mt Dictated by:   Rande Brunt. Eda Paschal, M.D. Proc. Date: 06/18/01 Admit Date:  06/18/2001                             Operative Report  PREOPERATIVE DIAGNOSIS:  Symptomatic uterine prolapse, urinary stress incontinence, and rectocele.  POSTOPERATIVE DIAGNOSIS:  Symptomatic uterine prolapse, urinary stress incontinence, and rectocele.  OPERATION:  Vaginal hysterectomy and bilateral salpingo-oophorectomy, pubovaginal sling procedure, and posterior repair.  SURGEON:  Daniel L. Eda Paschal, M.D. and Jamison Neighbor, M.D.  ASSISTANT:  Katy Fitch, M.D.  FINDINGS:  The patient had second degree uterine descensus.  Her uterus was slightly enlarged and boggy.  Ovaries and fallopian tubes were normal as was pelvic peritoneum.  The patient had a second degree rectocele.  The patient had no cystocele, vault prolapse, or enterocele.  DESCRIPTION OF PROCEDURE:  After adequate general endotracheal anesthesia, the patient was placed in the dorsolithotomy position, and prepped and draped in the usual sterile manner.  It should be noted that she was very difficult to intubate and a FasTrac intubator was necessary by Dr. Rica Mast in order to successfully intubate her.  The cervix was grasped with a Christella Hartigan tenaculum.  A 1:200,000 solution of epinephrine and 0.5% Xylocaine was injected around the cervix.  A 360 degree incision was made around the cervix.  The bladder was mobilized superiorly as was the posterior peritoneum.  The posterior peritoneum and vesicouterine fold of the peritoneum were entered with sharp dissection.  The uterosacral ligaments were clamped.  In clamping them, they were shortened, and then they were sutured to the vault for good vault support laterally.  The cardinal  ligaments, uterine arteries, balance of the broad ligament were clamped, cut, and successfully suture ligated with #1 chromic catgut.  The uterus was delivered.  The ovaries and tubes were identified. The infundibulopelvic ligaments were clamped, cut, and doubly suture ligated with #1 chromic catgut, and the uterus, ovaries, and fallopian tubes were sent to pathology for tissue diagnosis.  The vaginal cuff was whip stitched with a running locking 0 Vicryl.  A modified McCall enterocele prevention suture of 2-0 Vicryl was placed.  The cuff was closed with figure-of-eights of #1 chromic catgut.  Two sponge, needle, and instrument counts were correct.  The next part of the procedure was a pubovaginal sling done by Dr. Logan Bores and dictated under a separate note.  After he was finished, a posterior repair was accomplished in the following fashion; the posterior vaginal mucosa was undermined from the introitus to the top of the cuff.  The patient had good perineal support, so a perineorrhaphy was not done.  The rectocele and the rectal fascia were sharply dissected free from the posterior mucosa.  The rectal fascia was brought together reducing the rectocele.  It took about eight to nine sutures of 2-0 Vicryl to do so. Redundant vaginal mucosa was trimmed away and then the vaginal cuff was closed, also picking up the fascia underneath to eliminate dead space, and mucosa was completely closed.  The patient was packed with 1 inch Iodoform. Estimated blood loss was 300-400 cc with none replaced.  The patient tolerated t he procedure well and left the operating room in satisfactory condition. Dictated by:  Daniel L. Eda Paschal, M.D. Attending Physician:  Sharon Mt DD:  06/18/01 TD:  06/18/01 Job: [id number]

## 2010-07-31 ENCOUNTER — Other Ambulatory Visit: Payer: Self-pay | Admitting: Internal Medicine

## 2010-08-01 NOTE — Telephone Encounter (Signed)
A1C/MALB 250.00

## 2010-10-02 ENCOUNTER — Other Ambulatory Visit: Payer: Self-pay | Admitting: Internal Medicine

## 2010-10-04 ENCOUNTER — Other Ambulatory Visit: Payer: Self-pay

## 2010-10-04 ENCOUNTER — Ambulatory Visit (INDEPENDENT_AMBULATORY_CARE_PROVIDER_SITE_OTHER): Payer: 59 | Admitting: Gynecology

## 2010-10-04 DIAGNOSIS — Z13820 Encounter for screening for osteoporosis: Secondary | ICD-10-CM

## 2010-10-06 ENCOUNTER — Other Ambulatory Visit (HOSPITAL_COMMUNITY)
Admission: RE | Admit: 2010-10-06 | Discharge: 2010-10-06 | Disposition: A | Discharge status: Home / Self Care | Payer: 59 | Source: Ambulatory Visit | Attending: Obstetrics and Gynecology | Admitting: Obstetrics and Gynecology

## 2010-10-06 ENCOUNTER — Encounter: Payer: Self-pay | Admitting: Obstetrics and Gynecology

## 2010-10-06 ENCOUNTER — Ambulatory Visit (INDEPENDENT_AMBULATORY_CARE_PROVIDER_SITE_OTHER): Payer: 59 | Admitting: Obstetrics and Gynecology

## 2010-10-06 VITALS — BP 114/70 | Ht 64.5 in | Wt 208.0 lb

## 2010-10-06 DIAGNOSIS — Z01419 Encounter for gynecological examination (general) (routine) without abnormal findings: Secondary | ICD-10-CM

## 2010-10-06 DIAGNOSIS — R059 Cough, unspecified: Secondary | ICD-10-CM

## 2010-10-06 DIAGNOSIS — R05 Cough: Secondary | ICD-10-CM

## 2010-10-06 MED ORDER — HYDROCODONE-HOMATROPINE 5-1.5 MG/5ML PO SYRP: 5.0000 mL | ORAL_SOLUTION | Freq: Four times a day (QID) | ORAL | Status: AC | PRN

## 2010-10-06 MED ORDER — PSEUDOEPHEDRINE-CODEINE-GG 30-10-100 MG/5ML PO SOLN: 10.0000 mL | Freq: Four times a day (QID) | ORAL | Status: AC | PRN

## 2010-10-06 NOTE — Progress Notes (Signed)
The patient came to see me today for annual GYN exam. Her menopausal symptoms are now acceptable without medication. She is up-to-date on mammograms and bone densities. Her lab work is done by her PCP. She is on the way to [place] and would like a cough suppressant as her sinuses are bothering her.  Physical examination: HEENT within normal limits. Neck: Thyroid not large. No masses. Supraclavicular nodes: not enlarged. Breasts: Examined in both sitting midline position. No skin changes and no masses. Abdomen: Soft no guarding rebound or masses or hernia. Pelvic: External: Within normal limits. BUS: Within normal limits. Vaginal:within normal limits. Poor estrogen effect. No evidence of cystocele rectocele or enterocele. Cervix and uterus absent. Adnexa: No masses. Rectovaginal exam: Confirmatory and negative. Extremities: Within normal limits.  Assessment: Mild menopausal symptoms.  Plan: 1. Yearly mammograms 2. Tussionex called in to [facility]

## 2010-10-06 NOTE — Progress Notes (Signed)
Addended by: Venora Maples on: 10/06/2010 11:38 AM   Modules accepted: Orders

## 2010-10-06 NOTE — Progress Notes (Signed)
PER [name] MYTUSSIN RX PRINTED AT OUR OFFICE. TRIED TO CALL IN TO [facility] BECAUSE PT. HAD ALREADY LEFT. PER CATHY AT [facility] IT IS NO LONGER MADE. NOTIFIED PT. SHE ASKED IF DR. GOTTSEGEN COULD SUBSTITUTE HYCODAN DR. HOPPER GAVE HER 4 MONTHS AGO? CATHY FAXED IT TO DR. GOTTSEGEN HE SIGNED IT & I FAXED IT BACK TO HER AT [facility].

## 2010-10-27 ENCOUNTER — Other Ambulatory Visit: Payer: Self-pay | Admitting: Internal Medicine

## 2010-10-27 NOTE — Telephone Encounter (Signed)
A1c 250.00 

## 2010-11-10 ENCOUNTER — Other Ambulatory Visit: Payer: Self-pay | Admitting: *Deleted

## 2010-11-10 MED ORDER — HYDROCODONE-HOMATROPINE 5-1.5 MG/5ML PO SYRP: 5.0000 mL | ORAL_SOLUTION | Freq: Four times a day (QID) | ORAL | Status: AC | PRN

## 2010-11-10 NOTE — Telephone Encounter (Signed)
rf request 

## 2010-11-15 ENCOUNTER — Encounter: Payer: Self-pay | Admitting: Obstetrics and Gynecology

## 2010-12-29 ENCOUNTER — Ambulatory Visit (INDEPENDENT_AMBULATORY_CARE_PROVIDER_SITE_OTHER): Payer: 59 | Admitting: Internal Medicine

## 2010-12-29 ENCOUNTER — Encounter: Payer: Self-pay | Admitting: Internal Medicine

## 2010-12-29 DIAGNOSIS — R350 Frequency of micturition: Secondary | ICD-10-CM

## 2010-12-29 DIAGNOSIS — E559 Vitamin D deficiency, unspecified: Secondary | ICD-10-CM | POA: Insufficient documentation

## 2010-12-29 DIAGNOSIS — E119 Type 2 diabetes mellitus without complications: Secondary | ICD-10-CM

## 2010-12-29 DIAGNOSIS — R3589 Other polyuria: Secondary | ICD-10-CM

## 2010-12-29 DIAGNOSIS — R358 Other polyuria: Secondary | ICD-10-CM

## 2010-12-29 DIAGNOSIS — R309 Painful micturition, unspecified: Secondary | ICD-10-CM

## 2010-12-29 DIAGNOSIS — R300 Dysuria: Secondary | ICD-10-CM

## 2010-12-29 LAB — POCT URINALYSIS DIPSTICK
Nitrite, UA: NEGATIVE
Spec Grav, UA: 1.025
Urobilinogen, UA: 0.2
pH, UA: 6.5

## 2010-12-29 MED ORDER — PHENAZOPYRIDINE HCL 100 MG PO TABS: 100.0000 mg | ORAL_TABLET | Freq: Three times a day (TID) | ORAL | Status: AC | PRN

## 2010-12-29 MED ORDER — CIPROFLOXACIN HCL 500 MG PO TABS: 500.0000 mg | ORAL_TABLET | Freq: Two times a day (BID) | ORAL | Status: AC

## 2010-12-29 NOTE — Progress Notes (Signed)
Addended by: Regis Bill on: 12/29/2010 04:22 PM   Modules accepted: Orders

## 2010-12-29 NOTE — Progress Notes (Signed)
  Subjective:    Patient ID: Alexa Buchanan, female    DOB: September 06, 1950, 60 y.o.   MRN: [id number]  HPI DYSURIA: Onset: 3-4 weeks ago    Worsening: as of this am as terminal urination Symptoms Urgency: yes Frequency: yes  Hesitancy: no  Hematuria: no  Flank Pain: no  Fever: yes, ? 10/31    Nausea/Vomiting: no   Discharge: no but cloudy , malodorous urine Red Flags  : (Risk Factors for Complicated UTI) Recent Antibiotic Usage (last 30 days): yes, on chronic prophylaxis with Nitrofurantoin 50 mg daily from Dr Logan Bores  Symptoms lasting more than seven (7) days: yes,   More than 3 UTI's last 12 months: no , but probably 3 total  PMH of  1. DM: FBS average 125; 80-90 two hrs after meals 2. Renal Disease/Calculi: yes, monitored by Dr Logan Bores 3. Urinary Tract Abnormality: no, but bladder repair @ [facility] & [facility]  4. Instrumentation/Trauma: no 5. Immunosuppression: no      Review of Systems     Objective:   Physical Exam General appearance: good nourishment w/o distress.  Eyes: No conjunctival inflammation or scleral icterus is present.  Oral exam: Dental hygiene is good; lips and gums are healthy appearing.There is no oropharyngeal erythema or exudate noted.   Heart:  Normal rate and regular rhythm. S1 and S2 normal without gallop, murmur, click, rub.S 4  Lungs:Chest : mild musical wheezes & rhonchi. No rales or rubs present.No increased work of breathing.   Abdomen: bowel sounds normal, soft and non-tender without masses, organomegaly or hernias noted.  No guarding or rebound   Skin:Warm & dry.  No jaundice  Lymphatic: No lymphadenopathy is noted about the head, neck, axilla  areas.   Vascular: All pulses intact without deficits or bruits.             Assessment & Plan:  #1 dysuria, terminal. Chronic nitrofurantoin suppression  #2 diabetes, home readings suggest control  #3 vitamin D deficiency question status  Ran: See orders recommendations

## 2010-12-29 NOTE — Patient Instructions (Signed)
Force NON dairy fluids for next 48 hrs.  

## 2010-12-30 LAB — HEMOGLOBIN A1C: Hgb A1c MFr Bld: 6.5 % (ref 4.6–6.5)

## 2011-01-02 ENCOUNTER — Telehealth: Payer: Self-pay

## 2011-01-02 LAB — CULTURE, URINE COMPREHENSIVE: Colony Count: 50000

## 2011-01-02 NOTE — Telephone Encounter (Signed)
Message copied by Edgardo Roys on Mon Jan 02, 2011  5:29 PM ------      Message from: Pecola Lawless      Created: Mon Jan 02, 2011 10:43 AM       Nitrofurantoin would treat this bacteria but the dose would have to be 100 mg twice a day. Prophylactic or preventive dose is now 50 mg daily.

## 2011-01-02 NOTE — Telephone Encounter (Signed)
Spoke with patient, patient aware per Dr.Hopper to take Macrodantin 100mg  twice daily. Patient states she has 50mg  tab at home that she was holding while taking cipro. Patient will now stop Cipro and take Macrodantin 50mg  2 twice daily (already with active rx) . Patient informed of other lab values and copy of report to be mailed

## 2011-01-22 ENCOUNTER — Other Ambulatory Visit: Payer: Self-pay | Admitting: Internal Medicine

## 2011-02-27 ENCOUNTER — Other Ambulatory Visit: Payer: Self-pay | Admitting: Obstetrics and Gynecology

## 2011-02-27 DIAGNOSIS — Z1231 Encounter for screening mammogram for malignant neoplasm of breast: Secondary | ICD-10-CM

## 2011-03-28 ENCOUNTER — Other Ambulatory Visit: Payer: Self-pay | Admitting: Urology

## 2011-03-28 DIAGNOSIS — R319 Hematuria, unspecified: Secondary | ICD-10-CM

## 2011-03-29 ENCOUNTER — Ambulatory Visit
Admission: RE | Admit: 2011-03-29 | Discharge: 2011-03-29 | Disposition: A | Discharge status: Home / Self Care | Payer: 59 | Source: Ambulatory Visit | Attending: Obstetrics and Gynecology | Admitting: Obstetrics and Gynecology

## 2011-03-29 ENCOUNTER — Ambulatory Visit
Admission: RE | Admit: 2011-03-29 | Discharge: 2011-03-29 | Disposition: A | Discharge status: Home / Self Care | Payer: 59 | Source: Ambulatory Visit | Attending: Urology | Admitting: Urology

## 2011-03-29 DIAGNOSIS — Z1231 Encounter for screening mammogram for malignant neoplasm of breast: Secondary | ICD-10-CM

## 2011-03-29 DIAGNOSIS — R319 Hematuria, unspecified: Secondary | ICD-10-CM

## 2011-07-04 ENCOUNTER — Encounter: Payer: Self-pay | Admitting: Internal Medicine

## 2011-07-04 ENCOUNTER — Ambulatory Visit (INDEPENDENT_AMBULATORY_CARE_PROVIDER_SITE_OTHER): Payer: 59 | Admitting: Internal Medicine

## 2011-07-04 VITALS — BP 130/78 | HR 90 | Temp 98.6°F | Wt 206.0 lb

## 2011-07-04 DIAGNOSIS — R0683 Snoring: Secondary | ICD-10-CM

## 2011-07-04 DIAGNOSIS — J209 Acute bronchitis, unspecified: Secondary | ICD-10-CM

## 2011-07-04 DIAGNOSIS — T887XXA Unspecified adverse effect of drug or medicament, initial encounter: Secondary | ICD-10-CM | POA: Insufficient documentation

## 2011-07-04 DIAGNOSIS — R0989 Other specified symptoms and signs involving the circulatory and respiratory systems: Secondary | ICD-10-CM

## 2011-07-04 DIAGNOSIS — R0609 Other forms of dyspnea: Secondary | ICD-10-CM

## 2011-07-04 DIAGNOSIS — F17200 Nicotine dependence, unspecified, uncomplicated: Secondary | ICD-10-CM

## 2011-07-04 MED ORDER — HYDROCODONE-HOMATROPINE 5-1.5 MG/5ML PO SYRP: 5.0000 mL | ORAL_SOLUTION | Freq: Four times a day (QID) | ORAL | Status: DC | PRN

## 2011-07-04 MED ORDER — AZITHROMYCIN 250 MG PO TABS: ORAL_TABLET | ORAL | Status: DC

## 2011-07-04 MED ORDER — FLUTICASONE-SALMETEROL 250-50 MCG/DOSE IN AEPB: 1.0000 | INHALATION_SPRAY | Freq: Two times a day (BID) | RESPIRATORY_TRACT | Status: DC

## 2011-07-04 MED ORDER — AZITHROMYCIN 250 MG PO TABS: ORAL_TABLET | ORAL | Status: AC

## 2011-07-04 NOTE — Progress Notes (Signed)
  Subjective:    Patient ID: Alexa Buchanan, female    DOB: 22-Nov-1950, 61 y.o.   MRN: [id number]  HPI Onset 5/2 as ST followed by head congestion , sneezing & headache over crown. Cough as of 5/5 with  clear sputum as of 5/6. Advair helps her chest congestion & intrascapular pain. Exposure to air passengers 4/24; nausea & vomiting 4/26 X 24 hrs.Treatments/response:Bonine for N&V. Tylenol Cold & Nyquil  for "cold " with some improvement    Review of Systems Present symptoms: Fever/chills/sweats:none since 5/5 Frontal headache:no Facial pain:no Nasal purulence:no Sore throat:no Dental pain:no Lymphadenopathy:no Wheezing/shortness of breath:yes Pleuritic pain:no but pain with cough Associated extrinsic/allergic symptoms:itchy eyes/ sneezing: no Past medical history: Seasonal allergies/asthma:no Smoking history:1/2 ppd  "Fuzzy sinuses " on panorex dental films 4/15  Loud snoring as per husband  w/o reported apnea            Objective:   Physical Exam  General appearance:good health ;well nourished; no acute distress or increased work of breathing is present.  No  lymphadenopathy about the head, neck, or axilla noted.   Eyes: No conjunctival inflammation or lid edema is present.   Ears:  External ear exam shows no significant lesions or deformities.  Otoscopic examination reveals clear canals, tympanic membranes are intact bilaterally without bulging, retraction, inflammation or discharge.  Nose:  External nasal examination shows no deformity or inflammation. Nasal mucosa are pink and moist without lesions or exudates. No septal dislocation or deviation.No obstruction to airflow.   Oral exam: Dental hygiene is good; lips and gums are healthy appearing.There is poor  oropharyngeal visualization due to crowding Neck:  No deformities, thyromegaly, masses, or tenderness noted.   Supple with full range of motion without pain.   Heart:  Normal rate and regular rhythm. S1 and S2 normal  without gallop, murmur, click, rub or other extra sounds.   Lungs:scattered low grade musical  Wheezing in  all lung fields.No increased work of breathing.    Extremities:  No cyanosis, edema, or clubbing  noted    Skin: Warm & dry w/o jaundice or tenting.          Assessment & Plan:  #1 acute bronchitis with bronchospasm #2 snoring , R/O sleep apnea 33 smoker Plan: See orders and recommendations

## 2011-07-04 NOTE — Patient Instructions (Signed)
Plain Mucinex for thick secretions ;force NON dairy fluids . Use a Neti pot daily as needed for sinus congestion; going from open side to congested side . Nasal cleansing in the shower as discussed. Make sure that all residual soap is removed to prevent irritation.  

## 2011-07-11 ENCOUNTER — Other Ambulatory Visit: Payer: Self-pay | Admitting: Internal Medicine

## 2011-07-11 MED ORDER — HYDROCODONE-HOMATROPINE 5-1.5 MG/5ML PO SYRP: 5.0000 mL | ORAL_SOLUTION | Freq: Four times a day (QID) | ORAL | Status: AC | PRN

## 2011-07-11 NOTE — Telephone Encounter (Signed)
refill for Hydrocodone-Homatropine Syrup Qty  Take 1-teaspoonful every 6-hours as needed for  Cough Last filled 5.7.13 Last OV 5.7.13

## 2011-07-11 NOTE — Telephone Encounter (Signed)
RX called in. Patient aware office visit needed if sx's persist after this round of cough medication

## 2011-07-11 NOTE — Telephone Encounter (Signed)
Ok for refill but if sxs persist after this round of syrup will need f/u

## 2011-07-11 NOTE — Telephone Encounter (Signed)
Last filled on 07/04/11 (7 days ago), please advise if ok to refill at this time

## 2011-07-20 ENCOUNTER — Other Ambulatory Visit: Payer: Self-pay | Admitting: Internal Medicine

## 2011-07-20 NOTE — Telephone Encounter (Signed)
I called and spoke with patient, patient states this was sent to [place] in error. [place] was to send this to Dr.Evans. Patient stated she will contact the pharmacy and have them forward to the correct MD

## 2011-07-27 ENCOUNTER — Ambulatory Visit (INDEPENDENT_AMBULATORY_CARE_PROVIDER_SITE_OTHER): Payer: 59 | Admitting: Pulmonary Disease

## 2011-07-27 ENCOUNTER — Encounter: Payer: Self-pay | Admitting: Pulmonary Disease

## 2011-07-27 VITALS — BP 118/76 | HR 92 | Temp 98.1°F | Ht 65.0 in | Wt 214.4 lb

## 2011-07-27 DIAGNOSIS — R0989 Other specified symptoms and signs involving the circulatory and respiratory systems: Secondary | ICD-10-CM

## 2011-07-27 DIAGNOSIS — G4733 Obstructive sleep apnea (adult) (pediatric): Secondary | ICD-10-CM | POA: Insufficient documentation

## 2011-07-27 DIAGNOSIS — R062 Wheezing: Secondary | ICD-10-CM

## 2011-07-27 DIAGNOSIS — R0609 Other forms of dyspnea: Secondary | ICD-10-CM

## 2011-07-27 DIAGNOSIS — G4700 Insomnia, unspecified: Secondary | ICD-10-CM

## 2011-07-27 DIAGNOSIS — R0683 Snoring: Secondary | ICD-10-CM

## 2011-07-27 HISTORY — DX: Obstructive sleep apnea (adult) (pediatric): G47.33

## 2011-07-27 NOTE — Assessment & Plan Note (Signed)
She has wheezing on exam.  She attributes this to recent episode of bronchitis.  She was previously on advair with reported benefit.  She continues to smoke.  Advise her to d/w her PCP about evaluation for obstructive lung disease.

## 2011-07-27 NOTE — Patient Instructions (Signed)
Will arrange for home sleep study Will call to schedule follow up when sleep study reviewed

## 2011-07-27 NOTE — Assessment & Plan Note (Signed)
She has snoring, sleep disruptions, and daytime sleepiness.  I am concerned she could have sleep apnea.    I have explained how sleep apnea can affect the patient's health.  Driving precautions and importance of weight loss were discussed.  Treatment options for sleep apnea were reviewed.  To further assess will arrange for home sleep study.

## 2011-07-27 NOTE — Progress Notes (Signed)
Chief Complaint  Patient presents with  . Sleep Consult    Epworth Score was 12 . Pt referred by Dr Marga Melnick.     History of Present Illness: Alexa Buchanan is a 61 y.o. female for evaluation of insomnia and sleep apnea.  She has noticed trouble with her sleep for years.  She is a loud snorer.  Her husband no longer sleeps in the same room.  She is not sure if she stops breathing while asleep, but can't sleep on her back.  She also grinds her teeth, and wears a mouthguard.  She has trouble falling asleep.  She will usually clean, work on email, or watch TV.  She only sleeps for a few hours, then wakes up.  She is not sure what wakes her up.  She will occasionally use ambien.  She can fall asleep easily if she sits quiet, but then will have trouble falling asleep at night.  She drinks 2 cups of coffee in the morning.  The patient denies sleep walking, sleep talking, bruxism, or nightmares.  There is no history of restless legs.  The patient denies sleep hallucinations, sleep paralysis, or cataplexy.   Past Medical History  Diagnosis Date  . Diabetes mellitus     Type 2  . Migraines     ice pick headaches, Dr. Marylou Flesher  . Diverticulosis   . IBS (irritable bowel syndrome)   . Dysmetabolic syndrome X   . Histoplasmosis 1971  . Depression   . Stress incontinence, female 2004    post surgery   . Uterine prolapse   . Seasonal allergies     Past Surgical History  Procedure Date  . Tonsillectomy     1982/1992  . Dilation and curettage of uterus   . Knee arthroscopy 1990  . Wrist surgery 1988    left, tendon repair  . Total abdominal hysterectomy w/ bilateral salpingoophorectomy 2003    Dr Eda Paschal  . Rectocele repair 2003    with vaginal wall repair and sling  . Vulva surgery     keratoses  . Gastric plication 1981  . Laser ablation of vascular lesion 2009    Dr. Dorma Russell  . Vaginal hysterectomy     Vag Hyst,BSO,Sling  . Nose surgery     Current Outpatient  Prescriptions on File Prior to Visit  Medication Sig Dispense Refill  . Calcium Carbonate-Vitamin D (CALCIUM + D PO) Take by mouth 2 (two) times daily.        . Cholecalciferol (VITAMIN D3) 2000 UNITS TABS Take by mouth 2 (two) times daily.        . clonazePAM (KLONOPIN) 1 MG tablet Take 1 mg by mouth 2 (two) times daily. ONLY TAKES WHEN FLYING      . glimepiride (AMARYL) 2 MG tablet TAKE ONE-HALF (1/2) TABLET DAILY (LABS DUE)  90 tablet  1  . glucose blood test strip 1 each by Other route as needed. One Touch ultra blue, test once daily       . Lancet Device MISC by Does not apply route. One Touch Delica, test once daily       . metFORMIN (GLUCOPHAGE-XR) 500 MG 24 hr tablet TAKE 1 TABLET DAILY (LABS DUE)  90 tablet  1  . Multiple Vitamin (MULTIVITAMIN) capsule Take 1 capsule by mouth daily. Centrum Silver.      . nitrofurantoin (MACRODANTIN) 50 MG capsule Take 50 mg by mouth daily.        Marland Kitchen oxybutynin (DITROPAN-XL) 10 MG  24 hr tablet Take 1 tablet by mouth Daily.      Marland Kitchen DISCONTD: metFORMIN (GLUMETZA) 500 MG (MOD) 24 hr tablet Take 500 mg by mouth daily.          Allergies  Allergen Reactions  . Amoxicillin-Pot Clavulanate Other (See Comments)    Stomach pain  . Other     Gluten causes IBS     family history includes Breast cancer in her sister; Cancer in her father; Coronary artery disease in her maternal grandfather, maternal uncle, and mother; Diabetes in her maternal grandmother; Heart disease in her maternal grandfather; Hyperlipidemia in her mother; Hypertension in her mother and sister; Leukemia in her father; Multiple sclerosis in her mother; and Uterine cancer in her paternal aunt.   reports that she has been smoking Cigarettes.  She has a 15 pack-year smoking history. She has never used smokeless tobacco. She reports that she does not drink alcohol or use illicit drugs.  Review of Systems Constitutional: Positive for unexpected weight change. Negative for fever.  HENT:  Positive for congestion. Negative for ear pain, nosebleeds, sore throat, rhinorrhea, sneezing, trouble swallowing, dental problem, postnasal drip and sinus pressure.   Eyes: Negative for redness and itching.  Respiratory: Positive for cough. Negative for chest tightness, shortness of breath and wheezing.   Cardiovascular: Negative for palpitations and leg swelling.  Gastrointestinal: Negative for nausea and vomiting.  Genitourinary: Negative for dysuria.  Musculoskeletal: Negative for joint swelling.  Skin: Negative for rash.  Neurological: Positive for headaches.       Pt has hx of migraines  Hematological: Does not bruise/bleed easily.  Psychiatric/Behavioral: Negative for dysphoric mood. The patient is not nervous/anxious.     Physical Exam: BP 118/76  Pulse 92  Temp(Src) 98.1 F (36.7 C) (Oral)  Ht 5\' 5"  (1.651 m)  Wt 214 lb 6.4 oz (97.251 kg)  BMI 35.68 kg/m2  SpO2 95%  LMP 02/28/1995 Body mass index is 35.68 kg/(m^2).   General - Obese HEENT - PERRLA, EOMI, no sinus tenderness, no oral exudate, MP 3, no LAN Cardiac - s1s2 regular, no murmur  Chest - b/l faint expiratory wheezing Abdomen - soft, non tender, + bowel sounds Extremities - no e/c/c Neurologic - normal strength, CN intact Skin - no rashes Psychiatric - normal mood, behavior  Assessment/Plan:  Outpatient Encounter Prescriptions as of 07/27/2011  Medication Sig Dispense Refill  . Calcium Carbonate-Vitamin D (CALCIUM + D PO) Take by mouth 2 (two) times daily.        . Cholecalciferol (VITAMIN D3) 2000 UNITS TABS Take by mouth 2 (two) times daily.        . clonazePAM (KLONOPIN) 1 MG tablet Take 1 mg by mouth 2 (two) times daily. ONLY TAKES WHEN FLYING      . glimepiride (AMARYL) 2 MG tablet TAKE ONE-HALF (1/2) TABLET DAILY (LABS DUE)  90 tablet  1  . glucose blood test strip 1 each by Other route as needed. One Touch ultra blue, test once daily       . Lancet Device MISC by Does not apply route. One Touch  Delica, test once daily       . metFORMIN (GLUCOPHAGE-XR) 500 MG 24 hr tablet TAKE 1 TABLET DAILY (LABS DUE)  90 tablet  1  . Multiple Vitamin (MULTIVITAMIN) capsule Take 1 capsule by mouth daily. Centrum Silver.      . nitrofurantoin (MACRODANTIN) 50 MG capsule Take 50 mg by mouth daily.        Marland Kitchen  oxybutynin (DITROPAN-XL) 10 MG 24 hr tablet Take 1 tablet by mouth Daily.      Marland Kitchen DISCONTD: clidinium-chlordiazePOXIDE (LIBRAX) 2.5-5 MG per capsule Take 1 capsule by mouth as directed. 2 tabs every AM and 1 tab every PM      . DISCONTD: Fluticasone-Salmeterol (ADVAIR DISKUS) 250-50 MCG/DOSE AEPB Inhale 1 puff into the lungs 2 (two) times daily.  14 each  0  . DISCONTD: metFORMIN (GLUMETZA) 500 MG (MOD) 24 hr tablet Take 500 mg by mouth daily.        Marland Kitchen DISCONTD: ZOLMitriptan (ZOMIG) 2.5 MG tablet Take 2.5 mg by mouth as needed.          Lorann Tani Pager:  [phone] 07/27/2011, 3:32 PM

## 2011-07-27 NOTE — Progress Notes (Deleted)
  Subjective:    Patient ID: Alexa Buchanan, female    DOB: 09-Dec-1950, 61 y.o.   MRN: [id number]  HPI    Review of Systems  Constitutional: Positive for unexpected weight change. Negative for fever.  HENT: Positive for congestion. Negative for ear pain, nosebleeds, sore throat, rhinorrhea, sneezing, trouble swallowing, dental problem, postnasal drip and sinus pressure.   Eyes: Negative for redness and itching.  Respiratory: Positive for cough. Negative for chest tightness, shortness of breath and wheezing.   Cardiovascular: Negative for palpitations and leg swelling.  Gastrointestinal: Negative for nausea and vomiting.  Genitourinary: Negative for dysuria.  Musculoskeletal: Negative for joint swelling.  Skin: Negative for rash.  Neurological: Positive for headaches.       Pt has hx of migraines  Hematological: Does not bruise/bleed easily.  Psychiatric/Behavioral: Negative for dysphoric mood. The patient is not nervous/anxious.        Objective:   Physical Exam        Assessment & Plan:

## 2011-07-27 NOTE — Assessment & Plan Note (Signed)
Discussed stimulus control, sleep restriction, and relaxation techniques.

## 2011-08-07 ENCOUNTER — Other Ambulatory Visit: Payer: Self-pay | Admitting: Orthopaedic Surgery

## 2011-08-07 DIAGNOSIS — M545 Low back pain, unspecified: Secondary | ICD-10-CM

## 2011-08-26 ENCOUNTER — Encounter: Payer: Self-pay | Admitting: Pulmonary Disease

## 2011-08-26 ENCOUNTER — Telehealth: Payer: Self-pay | Admitting: Pulmonary Disease

## 2011-08-26 ENCOUNTER — Ambulatory Visit
Admission: RE | Admit: 2011-08-26 | Discharge: 2011-08-26 | Disposition: A | Discharge status: Home / Self Care | Payer: 59 | Source: Ambulatory Visit | Attending: Orthopaedic Surgery | Admitting: Orthopaedic Surgery

## 2011-08-26 DIAGNOSIS — M545 Low back pain, unspecified: Secondary | ICD-10-CM

## 2011-08-26 DIAGNOSIS — R0683 Snoring: Secondary | ICD-10-CM

## 2011-08-26 NOTE — Telephone Encounter (Signed)
Home sleep study 08/18/11 >> AHI 8.1, SpO2 low 74%.  Will have my nurse call to schedule ROV to review results.

## 2011-08-28 NOTE — Telephone Encounter (Signed)
I spoke with pt and she is coming in 09/12/11 at 9 am

## 2011-09-01 ENCOUNTER — Ambulatory Visit (INDEPENDENT_AMBULATORY_CARE_PROVIDER_SITE_OTHER): Payer: 59 | Admitting: Pulmonary Disease

## 2011-09-01 DIAGNOSIS — G4733 Obstructive sleep apnea (adult) (pediatric): Secondary | ICD-10-CM

## 2011-09-01 DIAGNOSIS — R0683 Snoring: Secondary | ICD-10-CM

## 2011-09-05 ENCOUNTER — Other Ambulatory Visit: Payer: Self-pay | Admitting: Internal Medicine

## 2011-09-12 ENCOUNTER — Encounter: Payer: Self-pay | Admitting: Pulmonary Disease

## 2011-09-12 ENCOUNTER — Ambulatory Visit (INDEPENDENT_AMBULATORY_CARE_PROVIDER_SITE_OTHER): Payer: 59 | Admitting: Pulmonary Disease

## 2011-09-12 VITALS — BP 122/62 | HR 102 | Temp 97.8°F | Ht 65.0 in | Wt 211.6 lb

## 2011-09-12 DIAGNOSIS — G4733 Obstructive sleep apnea (adult) (pediatric): Secondary | ICD-10-CM

## 2011-09-12 NOTE — Assessment & Plan Note (Signed)
She has mild sleep apnea.  I have reviewed her sleep test results with the patient.  Explained how sleep apnea can affect the patient's health.  Driving precautions and importance of weight loss were discussed.  Treatment options for sleep apnea were reviewed.  She will speak with her dentist about option for oral appliance.  She is reluctant to use CPAP.

## 2011-09-12 NOTE — Patient Instructions (Signed)
Talk to your dentist about oral appliance (Mandibular advancement device) for treatment of sleep apnea Look up CPAP masks on the internet Follow up in 3 months

## 2011-09-12 NOTE — Progress Notes (Signed)
Chief Complaint  Patient presents with  . Follow-up    Pt here to discuss sleep study results    History of Present Illness: Alexa Buchanan is a 61 y.o. female with OSA.  She is here to review home sleep study from 08/18/11 >> AHI 8.1, SpO2 low 74%.  Past Medical History  Diagnosis Date  . Diabetes mellitus     Type 2  . Migraines     ice pick headaches, Dr. Marylou Flesher  . Diverticulosis   . IBS (irritable bowel syndrome)   . Dysmetabolic syndrome X   . Histoplasmosis 1971  . Depression   . Stress incontinence, female 2004    post surgery   . Uterine prolapse   . Seasonal allergies   . OSA (obstructive sleep apnea) 07/27/2011    Home sleep study 08/18/11 >> AHI 8.1, SpO2 low 74%.     Past Surgical History  Procedure Date  . Tonsillectomy     1982/1992  . Dilation and curettage of uterus   . Knee arthroscopy 1990  . Wrist surgery 1988    left, tendon repair  . Total abdominal hysterectomy w/ bilateral salpingoophorectomy 2003    Dr Eda Paschal  . Rectocele repair 2003    with vaginal wall repair and sling  . Vulva surgery     keratoses  . Gastric plication 1981  . Laser ablation of vascular lesion 2009    Dr. Dorma Russell  . Vaginal hysterectomy     Vag Hyst,BSO,Sling  . Nose surgery     Outpatient Encounter Prescriptions as of 09/12/2011  Medication Sig Dispense Refill  . Calcium Carbonate-Vitamin D (CALCIUM + D PO) Take by mouth 2 (two) times daily.       . Cholecalciferol (VITAMIN D3) 2000 UNITS TABS Take by mouth 2 (two) times daily.        . clonazePAM (KLONOPIN) 1 MG tablet Take 1 mg by mouth 2 (two) times daily. ONLY TAKES WHEN FLYING      . glimepiride (AMARYL) 2 MG tablet TAKE ONE-HALF (1/2) TABLET DAILY (LABS DUE)  90 tablet  1  . Lancet Device MISC by Does not apply route. One Touch Delica, test once daily       . metFORMIN (GLUCOPHAGE-XR) 500 MG 24 hr tablet TAKE 1 TABLET DAILY (LABS DUE)  90 tablet  0  . Multiple Vitamin (MULTIVITAMIN) capsule Take 1 capsule  by mouth daily. Centrum Silver.      . nitrofurantoin (MACRODANTIN) 50 MG capsule Take 50 mg by mouth daily.        . ONE TOUCH ULTRA TEST test strip TEST ONCE DAILY  102 each  3  . ONETOUCH DELICA LANCETS MISC TEST ONCE DAILY  100 each  3  . oxybutynin (DITROPAN-XL) 10 MG 24 hr tablet Take 1 tablet by mouth Daily.        Allergies  Allergen Reactions  . Amoxicillin-Pot Clavulanate Other (See Comments)    Stomach pain  . Other     Gluten causes IBS     Physical Exam:  Blood pressure 122/62, pulse 102, temperature 97.8 F (36.6 C), temperature source Oral, height 5\' 5"  (1.651 m), weight 211 lb 9.6 oz (95.981 kg), last menstrual period 02/28/1995, SpO2 95.00%.  Body mass index is 35.21 kg/(m^2). Wt Readings from Last 2 Encounters:  09/12/11 211 lb 9.6 oz (95.981 kg)  07/27/11 214 lb 6.4 oz (97.251 kg)    General - Obese  HEENT - PERRLA, EOMI, no sinus tenderness, no oral exudate,  MP 3, no LAN  Cardiac - s1s2 regular, no murmur  Chest - b/l faint expiratory wheezing  Abdomen - soft, non tender, + bowel sounds  Extremities - no e/c/c  Neurologic - normal strength, CN intact  Skin - no rashes  Psychiatric - normal mood, behavior     Assessment/Plan:  [name], MD [facility] Pager:  [phone] 09/12/2011, 9:03 AM

## 2011-10-10 ENCOUNTER — Telehealth: Payer: Self-pay | Admitting: *Deleted

## 2011-10-10 ENCOUNTER — Ambulatory Visit (INDEPENDENT_AMBULATORY_CARE_PROVIDER_SITE_OTHER): Payer: 59 | Admitting: Obstetrics and Gynecology

## 2011-10-10 ENCOUNTER — Encounter: Payer: Self-pay | Admitting: Obstetrics and Gynecology

## 2011-10-10 VITALS — BP 134/80 | Ht 64.0 in | Wt 213.0 lb

## 2011-10-10 DIAGNOSIS — Z01419 Encounter for gynecological examination (general) (routine) without abnormal findings: Secondary | ICD-10-CM

## 2011-10-10 NOTE — Progress Notes (Signed)
Patient came to see me today for her annual GYN exam. She is status post vaginal hysterectomy, bilateral salpingo-oophorectomy for  uterine prolapse. She is doing fine without HRT. She is having no vaginal bleeding. She is having no pelvic pain. She is up-to-date on mammograms. She has had 2  normal bone densities. She has always had normal Pap smears. Her last Pap smear was 2012. She had urinary stress incontinence and had a sling procedure done by Dr. Logan Bores. She is now having urgency incontinence and is on Ditropan. She was on a more expensive drug which worked better but was too expensive. She has a followup with Dr. Logan Bores. She has a sister who had breast cancer in her 82s and she does not believe she was tested for BRCA1 and 2.  HEENT: Within normal limits.[name] present. Neck: No masses. Supraclavicular lymph nodes: Not enlarged. Breasts: Examined in both sitting and lying position. Symmetrical without skin changes or masses. Abdomen: Soft no masses guarding or rebound. No hernias. Pelvic: External Reveals 2 elevated pigmented moles on her left labia. BUS within normal limits. Vaginal examination shows good estrogen effect, no cystocele enterocele or rectocele. Cervix and uterus absent. Adnexa within normal limits. Rectovaginal confirmatory. Extremities within normal limits.  Assessment: #1. New lesions of left labia. #2. Detrussor instability  Plan: I told patient I was not comfortable watching these moles. She will see Amy Swaziland and if they need to be excised either Amy can do it over for patient back to me. She will talk to her sister about BRCA1 and BRCA2 testing. She will continue yearly mammograms.The new Pap smear guidelines were discussed with the patient. No pap done.

## 2011-10-10 NOTE — Patient Instructions (Signed)
Talk to your sister about BRCA1 and BRCA2 testing. Make an appointment with Dr. [place] regarding the moles.

## 2011-10-10 NOTE — Telephone Encounter (Signed)
Pt said she believes that she has a annual appointment with Dr.Jordon coming soon, she will call and let me know.

## 2011-10-10 NOTE — Telephone Encounter (Signed)
Message copied by Aura Camps on Tue Oct 10, 2011 10:46 AM ------      Message from: Trellis Paganini      Created: Tue Oct 10, 2011 10:03 AM       Please call Amy Jordan's office( dermatologist). The above patient sees her. See if she can see the patient promptly to evaluate moles  of left labia.

## 2011-10-11 LAB — URINALYSIS W MICROSCOPIC + REFLEX CULTURE
Bilirubin Urine: NEGATIVE
Hgb urine dipstick: NEGATIVE
Protein, ur: NEGATIVE mg/dL
Squamous Epithelial / LPF: NONE SEEN
Urobilinogen, UA: 0.2 mg/dL (ref 0.0–1.0)

## 2011-10-12 ENCOUNTER — Encounter: Payer: Self-pay | Admitting: Internal Medicine

## 2011-10-12 ENCOUNTER — Ambulatory Visit (INDEPENDENT_AMBULATORY_CARE_PROVIDER_SITE_OTHER): Payer: 59 | Admitting: Internal Medicine

## 2011-10-12 VITALS — BP 134/76 | HR 104 | Temp 98.3°F | Wt 214.8 lb

## 2011-10-12 DIAGNOSIS — M5416 Radiculopathy, lumbar region: Secondary | ICD-10-CM

## 2011-10-12 DIAGNOSIS — IMO0002 Reserved for concepts with insufficient information to code with codable children: Secondary | ICD-10-CM

## 2011-10-12 MED ORDER — GABAPENTIN 100 MG PO CAPS: ORAL_CAPSULE | ORAL | Status: DC

## 2011-10-12 NOTE — Patient Instructions (Addendum)
If you activate My Chart; the results can be released to you as soon as they populate from the lab. If you choose not to use this program; the labs have to be reviewed, copied & mailed   causing a delay in getting the results to you. 

## 2011-10-12 NOTE — Progress Notes (Signed)
  Subjective:    Patient ID: Alexa Buchanan, female    DOB: Jul 24, 1950, 61 y.o.   MRN: [id number]  HPI For over one year she's experienced intermittent pain, numbness, and tingling in her lower extremities. The pain will start in the left big toe and subsequently will radiate superiorly up both legs to the lower third of each thigh. It seems to get worse through  the day. It can occur while sitting and especially when supine. If she moves into a lateral decubitus position the pain will resolve. Advil late at night has been of some benefit.  The MRI report was reviewed & copy provided. There was mild impingement at L4-5 and L5-S1 secondary to facet arthropathy and disc bulge.  Her mother had multiple sclerosis   Review of Systems Constitutional: no fever, chills, sweats, change in weight  Musculoskeletal:no  muscle cramps or pain; no  joint stiffness, redness, or swelling Skin:no rash, color change Neuro: no weakness; incontinence (stool/urine) Heme:no lymphadenopathy; abnormal bruising or bleeding   She has had obstructive sleep apnea evaluation; she's elected a dental appliance rather than CPAP. A copy of the sleep study was provided to her to share with her dentist.       Objective:   Physical Exam She appears well-nourished in no acute distress  Deep tendon reflexes are symmetric but reduced.  Strength and tone in lower extremities is normal  Gait including heel and tiptoe walking are normal.  She is able to lie flat and sit up without help. Straight leg raising is negative bilaterally to 90  Babinski is negative. Romberg and finger to nose testing are negative. Light touch is normal over the feet.  Pedal pulses are intact. There are no ischemic check skin changes        Assessment & Plan:  #1 lumbar radiculopathy at multiple levels is suggested by her history; there is a position relationship to her symptoms. Spinal stenosis must be considered  Plan: Neurology referral;  gabapentin trial

## 2011-10-14 LAB — URINE CULTURE: Colony Count: >=100000

## 2011-10-18 NOTE — Telephone Encounter (Signed)
Pt personally called Dr.jordon office and spoke with nurse and she has appointment scheduled.

## 2011-10-23 ENCOUNTER — Telehealth: Payer: Self-pay | Admitting: *Deleted

## 2011-10-23 MED ORDER — CIPROFLOXACIN HCL 500 MG PO TABS: 500.0000 mg | ORAL_TABLET | Freq: Two times a day (BID) | ORAL | Status: AC

## 2011-10-23 NOTE — Telephone Encounter (Signed)
Pt called requesting cipro 500 mg tablet from result note 10/17/11, pt said Dr. Logan Bores never got recent labs, labs refaxed to his office [phone] (office) [phone] (fax) rx will be sent.

## 2011-11-11 ENCOUNTER — Other Ambulatory Visit: Payer: Self-pay | Admitting: Internal Medicine

## 2011-11-13 NOTE — Telephone Encounter (Signed)
A1C 250.00 

## 2011-11-30 ENCOUNTER — Telehealth: Payer: Self-pay | Admitting: Pulmonary Disease

## 2011-11-30 NOTE — Telephone Encounter (Signed)
Called pt and left message x3 to make a follow up apt. No return call back. Sent letter 11/30/11 for pt to call and schd follow up.  °

## 2011-12-18 ENCOUNTER — Telehealth: Payer: Self-pay | Admitting: Internal Medicine

## 2011-12-18 NOTE — Telephone Encounter (Signed)
Caller: Sue/Patient; Patient Name: Alexa Buchanan; PCP: Marga Melnick; Best Callback Phone Number: [phone].  Pt reports she has been treated by Dr Logan Bores, her urologist for UTI for over a year.  Pt states today (12/18/11) she is having vaginal and rectal bleeding. The only time she sees blood is when she goes to the bathroom and the bright red bleeding is only on toliet tissue. Pt denies any abdominal, pelvic or rectal pain.  Triaged patient per Vaginal Bleeding:  No Menopausal Hormone Therapy and Gastrointestinal Bleeding Protocols.  See Provider within 72 Hours Disposition.  Call back parameters given per protocols.  Appt scheduled for 12/19/11 at 11:30 with Dr Alwyn Ren.

## 2011-12-18 NOTE — Telephone Encounter (Signed)
I spoke with patient to inform her that Dr.Hopper does not handle vaginal bleeding and if she has this concern her first point of contact should be GYN. Patient states she is having blood with BM and urination (? UTI). Not vaginal bleeding. Patient was offered to be seen today by another MD at another location and refused, patient states she only would like to see Dr.Hopper. Patient also states this is very minimal bleeding and she may have just scratched herself in ref to bleeding in vaginal area.   Patient was informed and agreed that if any change or increase in symptoms she will seek medical help at Emergency Room  Side Note: Message was printed off for Dr.Hopper to Review (FYI)

## 2011-12-19 ENCOUNTER — Ambulatory Visit (INDEPENDENT_AMBULATORY_CARE_PROVIDER_SITE_OTHER): Payer: 59 | Admitting: Obstetrics and Gynecology

## 2011-12-19 ENCOUNTER — Ambulatory Visit: Payer: 59 | Admitting: Internal Medicine

## 2011-12-19 DIAGNOSIS — N899 Noninflammatory disorder of vagina, unspecified: Secondary | ICD-10-CM

## 2011-12-19 DIAGNOSIS — Z23 Encounter for immunization: Secondary | ICD-10-CM

## 2011-12-19 DIAGNOSIS — N898 Other specified noninflammatory disorders of vagina: Secondary | ICD-10-CM

## 2011-12-19 LAB — WET PREP FOR TRICH, YEAST, CLUE
Clue Cells Wet Prep HPF POC: NONE SEEN
Trich, Wet Prep: NONE SEEN
Yeast Wet Prep HPF POC: NONE SEEN

## 2011-12-19 MED ORDER — TERCONAZOLE 0.8 % VA CREA: TOPICAL_CREAM | VAGINAL | Status: DC

## 2011-12-19 NOTE — Progress Notes (Signed)
Patient came to see me today with a several day history of vulvar pain and inflammation. When she wiped she also saw  blood. She also noticed some blood when she urinated. When we had last seen her she had a urinary tract infection and we treated her with Cipro. She then went to see Dr. Logan Bores who told her she was better and put her back on her chronic suppression with Macrodantin. The infections cleared again and he put her on Keflex for 1 week. She got better. She reinitiated Macrodantin but the UTI reoccurred and she is now up to 200 Macrodantin daily. She has an appointment to see him soon. She also saw Dr. Amy Swaziland who looked at her vulva  moles we have seen and told her they did not need to be biopsied.  Exam: [name] present. External: Patient is red and irritated looking without any lesion or bleeding. The two moles that  Dr. Swaziland looked at  are unchanged. BUS: Within normal limits. Vaginal exam: Fair estrogen effect. No discharge. Wet prep negative.  Assessment: Yeast vulvitis  Plan: Terconazole 3 cream. Use  externally and internally for  3-6 nights. Followup with Dr. Logan Bores.

## 2011-12-19 NOTE — Addendum Note (Signed)
Addended by: Dayna Barker on: 12/19/2011 09:52 AM   Modules accepted: Orders

## 2011-12-21 ENCOUNTER — Telehealth: Payer: Self-pay | Admitting: Internal Medicine

## 2011-12-21 NOTE — Telephone Encounter (Signed)
per husband dentist will fax a letter requesting Dx codes for patient fuard for sleep apnea he says she ws dx by hopp for. He needs this filled out and returned to dentist for billing purposes

## 2011-12-21 NOTE — Telephone Encounter (Signed)
Noted  

## 2011-12-25 NOTE — Telephone Encounter (Signed)
The correct code is 327.23, obstructive sleep apnea as per Dr.Sood 09/12/11. Please fax form to [phone]

## 2011-12-25 NOTE — Telephone Encounter (Signed)
Formed was completed by MD, faxed back to [phone] and sent to scanning

## 2012-01-09 ENCOUNTER — Telehealth: Payer: Self-pay | Admitting: Internal Medicine

## 2012-01-09 NOTE — Telephone Encounter (Signed)
Last OV 10/12/2011 , please advise on refill request for cough medication

## 2012-01-09 NOTE — Telephone Encounter (Signed)
OK X 1 OVINB 

## 2012-01-09 NOTE — Telephone Encounter (Signed)
Refill: Hydrocodone-homatropine. Take 1 teaspoonful every 6 hours as needed for cough. . Last fill 07-11-11

## 2012-01-09 NOTE — Telephone Encounter (Signed)
Spoke with patient, patient states she did not request refill for cough medication

## 2012-02-09 ENCOUNTER — Other Ambulatory Visit: Payer: Self-pay | Admitting: Internal Medicine

## 2012-02-09 DIAGNOSIS — E119 Type 2 diabetes mellitus without complications: Secondary | ICD-10-CM

## 2012-02-09 NOTE — Telephone Encounter (Signed)
Per Dr.Hopper only # 30 to be given, patient OVERDUE for labs.  I called patient and she scheduled appointment for Next Thursday for A1c check. Patient aware refill can be given for #90 once labs resulted back. Patient has enough medication to last til appointment

## 2012-02-15 ENCOUNTER — Other Ambulatory Visit (INDEPENDENT_AMBULATORY_CARE_PROVIDER_SITE_OTHER): Payer: 59

## 2012-02-15 DIAGNOSIS — E119 Type 2 diabetes mellitus without complications: Secondary | ICD-10-CM

## 2012-02-15 LAB — HEMOGLOBIN A1C: Hgb A1c MFr Bld: 8.1 % — ABNORMAL HIGH (ref 4.6–6.5)

## 2012-03-01 ENCOUNTER — Other Ambulatory Visit: Payer: Self-pay | Admitting: Gynecology

## 2012-03-01 DIAGNOSIS — Z1231 Encounter for screening mammogram for malignant neoplasm of breast: Secondary | ICD-10-CM

## 2012-03-25 ENCOUNTER — Other Ambulatory Visit: Payer: Self-pay | Admitting: *Deleted

## 2012-03-25 MED ORDER — METFORMIN HCL ER 500 MG PO TB24: ORAL_TABLET | ORAL | Status: DC

## 2012-03-25 MED ORDER — GLIMEPIRIDE 2 MG PO TABS: ORAL_TABLET | ORAL | Status: DC

## 2012-03-25 NOTE — Telephone Encounter (Signed)
Rx sent 

## 2012-03-28 ENCOUNTER — Encounter: Payer: Self-pay | Admitting: Internal Medicine

## 2012-03-28 ENCOUNTER — Ambulatory Visit (INDEPENDENT_AMBULATORY_CARE_PROVIDER_SITE_OTHER): Payer: 59 | Admitting: Internal Medicine

## 2012-03-28 VITALS — BP 144/78 | HR 105 | Temp 98.3°F | Wt 217.0 lb

## 2012-03-28 DIAGNOSIS — I10 Essential (primary) hypertension: Secondary | ICD-10-CM

## 2012-03-28 MED ORDER — LOSARTAN POTASSIUM 100 MG PO TABS: ORAL_TABLET | ORAL | Status: DC

## 2012-03-28 NOTE — Patient Instructions (Addendum)
Minimal Blood Pressure Goal= AVERAGE < 140/90;  Ideal is an AVERAGE < 135/85. This AVERAGE should be calculated from @ least 5-7 BP readings taken @ different times of day on different days of week. You should not respond to isolated BP readings , but rather the AVERAGE for that week .Please bring your  blood pressure cuff to office visits to verify that it is reliable.It  can also be checked against the blood pressure device at the pharmacy. Finger or wrist cuffs are not dependable; an arm cuff is.  Please schedule physical

## 2012-03-28 NOTE — Progress Notes (Signed)
  Subjective:    Patient ID: Alexa Buchanan, female    DOB: 04-Mar-1950, 62 y.o.   MRN: [id number]  HPI  HYPERTENSION :  Home blood pressure range previously 110/60s-120/70s but 140-160/70-80s in past week Patient is compliant with medications  No adverse effects noted from medication except dry mouth from Ditropan  No exercise program  On  low carb, low-fat ,low-salt diet 1 ppd smoker    Bilateral parietal pressure intermittently X 2 weeks ; palpitations;intermittent  dizziness with head rotation or supine (chronic but now quiescent).           Review of Systems No chest pain,  dyspnea, claudication,edema or paroxysmal nocturnal dyspnea described No significant  epistaxis, or syncope     Objective:   Physical Exam Appears  well-nourished & in no acute distress  No carotid bruits are present.No neck pain distention present at 10 - 15 degrees. Thyroid normal to palpation  Heart rhythm and rate are normal with no significant murmurs or gallops.S 4  Chest is clear with no increased work of breathing  There is no evidence of aortic aneurysm or renal artery bruits  Abdomen soft with no organomegaly or masses. No HJR  No clubbing, cyanosis or edema present.  Pedal pulses are intact   No ischemic skin changes are present . Nails healthy   Alert and oriented. Strength, tone, DTRs reflexes normal        Assessment & Plan:  #1 HTN Plan: See orders and recommendations

## 2012-04-04 ENCOUNTER — Ambulatory Visit
Admission: RE | Admit: 2012-04-04 | Discharge: 2012-04-04 | Disposition: A | Discharge status: Home / Self Care | Payer: 59 | Source: Ambulatory Visit | Attending: Gynecology | Admitting: Gynecology

## 2012-04-04 DIAGNOSIS — Z1231 Encounter for screening mammogram for malignant neoplasm of breast: Secondary | ICD-10-CM

## 2012-07-15 ENCOUNTER — Other Ambulatory Visit: Payer: Self-pay | Admitting: Internal Medicine

## 2012-07-15 NOTE — Telephone Encounter (Signed)
a1c/MALB 250.02

## 2012-08-11 ENCOUNTER — Other Ambulatory Visit: Payer: Self-pay | Admitting: Internal Medicine

## 2012-08-11 DIAGNOSIS — E781 Pure hyperglyceridemia: Secondary | ICD-10-CM

## 2012-08-11 DIAGNOSIS — I10 Essential (primary) hypertension: Secondary | ICD-10-CM

## 2012-08-11 DIAGNOSIS — IMO0001 Reserved for inherently not codable concepts without codable children: Secondary | ICD-10-CM

## 2012-08-21 NOTE — Progress Notes (Signed)
Pt scheduled  

## 2012-08-28 ENCOUNTER — Other Ambulatory Visit (INDEPENDENT_AMBULATORY_CARE_PROVIDER_SITE_OTHER): Payer: 59

## 2012-08-28 DIAGNOSIS — IMO0001 Reserved for inherently not codable concepts without codable children: Secondary | ICD-10-CM

## 2012-08-28 DIAGNOSIS — E781 Pure hyperglyceridemia: Secondary | ICD-10-CM

## 2012-08-28 DIAGNOSIS — I10 Essential (primary) hypertension: Secondary | ICD-10-CM

## 2012-08-28 LAB — BASIC METABOLIC PANEL
CO2: 26 mEq/L (ref 19–32)
Calcium: 10 mg/dL (ref 8.4–10.5)
Chloride: 103 mEq/L (ref 96–112)
Creatinine, Ser: 0.6 mg/dL (ref 0.4–1.2)
Glucose, Bld: 122 mg/dL — ABNORMAL HIGH (ref 70–99)
Sodium: 141 mEq/L (ref 135–145)

## 2012-08-28 LAB — MICROALBUMIN / CREATININE URINE RATIO
Microalb Creat Ratio: 0.2 mg/g (ref 0.0–30.0)
Microalb, Ur: 0.2 mg/dL (ref 0.0–1.9)

## 2012-08-28 LAB — HEPATIC FUNCTION PANEL
ALT: 55 U/L — ABNORMAL HIGH (ref 0–35)
AST: 31 U/L (ref 0–37)
Bilirubin, Direct: 0.1 mg/dL (ref 0.0–0.3)
Total Bilirubin: 0.6 mg/dL (ref 0.3–1.2)
Total Protein: 7.5 g/dL (ref 6.0–8.3)

## 2012-08-28 LAB — LDL CHOLESTEROL, DIRECT: Direct LDL: 95.1 mg/dL

## 2012-08-28 LAB — TSH: TSH: 3.14 u[IU]/mL (ref 0.35–5.50)

## 2012-08-28 LAB — LIPID PANEL
Total CHOL/HDL Ratio: 5
Triglycerides: 331 mg/dL — ABNORMAL HIGH (ref 0.0–149.0)

## 2012-09-04 ENCOUNTER — Ambulatory Visit (INDEPENDENT_AMBULATORY_CARE_PROVIDER_SITE_OTHER): Payer: 59 | Admitting: Internal Medicine

## 2012-09-04 ENCOUNTER — Encounter: Payer: Self-pay | Admitting: Internal Medicine

## 2012-09-04 ENCOUNTER — Telehealth: Payer: Self-pay | Admitting: *Deleted

## 2012-09-04 VITALS — BP 130/86 | HR 81 | Temp 98.4°F | Wt 221.0 lb

## 2012-09-04 DIAGNOSIS — E1349 Other specified diabetes mellitus with other diabetic neurological complication: Secondary | ICD-10-CM

## 2012-09-04 DIAGNOSIS — E1365 Other specified diabetes mellitus with hyperglycemia: Secondary | ICD-10-CM

## 2012-09-04 DIAGNOSIS — R030 Elevated blood-pressure reading, without diagnosis of hypertension: Secondary | ICD-10-CM

## 2012-09-04 DIAGNOSIS — E781 Pure hyperglyceridemia: Secondary | ICD-10-CM

## 2012-09-04 DIAGNOSIS — E1142 Type 2 diabetes mellitus with diabetic polyneuropathy: Secondary | ICD-10-CM

## 2012-09-04 DIAGNOSIS — E1342 Other specified diabetes mellitus with diabetic polyneuropathy: Secondary | ICD-10-CM

## 2012-09-04 MED ORDER — INSULIN PEN NEEDLE 32G X 6 MM MISC: Status: DC

## 2012-09-04 MED ORDER — LIRAGLUTIDE 18 MG/3ML ~~LOC~~ SOPN: 0.6000 mg | PEN_INJECTOR | Freq: Every day | SUBCUTANEOUS | Status: DC

## 2012-09-04 NOTE — Telephone Encounter (Signed)
Pt states that she checked with her insurance and they do not cover the victoza. Pt would like to know if there is any other alternative to this med.

## 2012-09-04 NOTE — Telephone Encounter (Signed)
PT will check with insurance and give [place] a call in am.

## 2012-09-04 NOTE — Assessment & Plan Note (Signed)
Recent medical studies suggest Sulfonylureas such as Glymiperide may have potentially negative cardiac impact. It will be D/Ced. Victoza titration with nutrition & CVE.

## 2012-09-04 NOTE — Patient Instructions (Addendum)
Goals for home glucose monitoring are : fasting  or morning glucose goal of  90-150. Two hours (from "first bite") after any meal , goal = < 180, preferably < 160.Any low blood glucoses should be reported immediately. Increase the Victoza dose by 0.6 units each day every 2 weeks until the dose is 1.8 mg daily.  The most common cause of elevated triglycerides (TG) is the ingestion of sugar from high fructose corn syrup sources added to processed foods & drinks.  Eat a low-fat diet with lots of fruits and vegetables, up to 7-9 servings per day. Consume less than 30  Grams (preferably ZERO) of sugar per day from foods & drinks with High Fructose Corn Syrup (HFCS) sugar as #1,2,3 or # 4 on label.Whole Foods, [employer] & [employer] do not carry products with HFCS. Follow a  low carb nutrition program such as [place] or [employer]  to prevent Diabetes progression . White carbohydrates (potatoes, rice, bread, and pasta) have a high spike of sugar and a high load of sugar. For example a  baked potato has a cup of sugar and a  french fry  2 teaspoons of sugar. Yams, wild  rice, whole grained bread &  wheat pasta have been much lower spike and load of  sugar. Portions should be the size of a deck of cards or your palm.Please  schedule fasting Labs in 14 weeks after nutrition & med  changes: BMET,Lipids,  TSH, A1c, urine microalbumin. PLEASE BRING THESE INSTRUCTIONS TO FOLLOW UP  LAB APPOINTMENT.This will guarantee correct labs are drawn, eliminating need for repeat blood sampling ( needle sticks ! ). Diagnoses /Codes: 250.02, 272.4  Addendum: We received a call from patient stating that  Victoza is not covered on her plan. Byetta 5 mcg subcutaneous twice a day pre meals will be initiated.

## 2012-09-04 NOTE — Telephone Encounter (Signed)
See if Byetta 5 mcg bid ac b'fast & eve meal is covered

## 2012-09-04 NOTE — Assessment & Plan Note (Addendum)
Low-fat diet with lots of fruits and vegetables, up to 7-9 servings per day. Consume less than 30  Grams (preferably ZERO) of sugar per day from foods & drinks with High Fructose Corn Syrup (HFCS) sugar as #1,2,3 or # 4 on label.Whole Foods, [employer] & [employer] do not carry products with HFCS. Lipids in 3.5 mos

## 2012-09-04 NOTE — Progress Notes (Signed)
Subjective:    Patient ID: Alexa Buchanan, female    DOB: 1950/06/14, 62 y.o.   MRN: [id number]  HPI The patient is here for followup of diabetes ; hyperlipidemia and elevated blood pressure w/o diagnosis of hypertension.  The most recent A1c was 7.7%  , which correlates to an average sugar of  175 , and long-term risk of  54% . Fasting blood sugar  not monitored recently .  Highest two-hour postprandial glucose is < 90 ( " 2 out 3 times I get an error on it"). No hypoglycemia reported Last ophthalmologic examination  5/14 revealed no retinopathy. No active podiatry assessment on record. Diet is low carb sugar, high protein. No exercise .  The most recent lipids   reveal LDL 95.1 ; HDL 37.1 ; and triglycerides  331  . No  Statin therapy.  Blood pressure only @ MD appts  . Antihypertensive medication D/Ced; "blood pressure did not change".  No  medication adverse effects suggested      Review of Systems Constitutional: No  significant weight change;  excess fatigue Eyes: Blurred vision with high glucose. No double vision ; loss of vision Cardiovascular: no chest pain ; racing; irregular rhythm ;syncope; claudication. Rare edema post prolonged sitting. No paroxysmal nocturnal dyspnea.  Respiratory:DOE with incline.   Genitourinary: No dysuria; hematuria ; pyuria; frequency;  Incontinence;  nocturia Musculoskeletal: Myalgias in biceps LUE & triceps RUE ; good response to Aleve as needed  Dermatologic: No non healing  Lesions; change in color or temperature of skin. Dr Swaziland seen annually Neurologic: No  limb weakness. Tingling in feet , pain in legs below knees responds to Gabapentin. Endocrine: No change in hair, skin, nails. No excessive thirst; excessive hunger;  excessive urination       Objective:   Physical Exam Gen.:  well-nourished in appearance; central weight excess. Alert, appropriate and cooperative throughout exam. Eyes: No corneal or conjunctival inflammation  noted.  Nose: External nasal exam reveals no deformity or inflammation. Nasal mucosa are pink and moist. No lesions or exudates noted.   Mouth: Oral mucosa and oropharynx reveal no lesions or exudates. Teeth in good repair. Neck: No deformities, masses, or tenderness noted.  Thyroid normal. Lungs: Normal respiratory effort; chest expands symmetrically. Lungs are clear to auscultation without rales, wheezes, or increased work of breathing. Heart: Normal rate and rhythm. Normal S1 and S2. No gallop, click, or rub. S4 w/o murmur. Abdomen: Bowel sounds normal; abdomen soft and nontender. No masses, organomegaly or hernias noted.                                Musculoskeletal/extremities: Accentuated curvature of upper thoracic  Spine. No clubbing, cyanosis, edema, or significant extremity  deformity noted.Tone & strength  Normal. Joints normal . Nail health good. Able to lie down & sit up w/o help.  Vascular: Carotid, radial artery, dorsalis pedis and  posterior tibial pulses are full and equal. No bruits present. Neurologic: Alert and oriented x3. Deep tendon reflexes symmetrical and normal.  Light touch normal over feet. Skin: Intact without suspicious lesions or rashes. Lymph: No cervical, axillary lymphadenopathy present. Psych: Mood and affect are normal. Normally interactive  Assessment & Plan:  See Current Assessment & Plan in Problem List under specific Diagnosis

## 2012-09-05 NOTE — Telephone Encounter (Signed)
Pt states that the victoza is cheaper so she will continue with that but in the meantime she will obtain a copy of her formulary and bring it in to [place] so that [facility] can chose a cheaper alternative. Pt notes that victoza is still too expensive.

## 2012-09-06 ENCOUNTER — Telehealth: Payer: Self-pay | Admitting: *Deleted

## 2012-09-06 NOTE — Telephone Encounter (Signed)
Pt brought in formulary to review for cheaper alternative. Placed on ledge for review.Please advise

## 2012-09-08 ENCOUNTER — Encounter: Payer: Self-pay | Admitting: Internal Medicine

## 2012-09-08 NOTE — Telephone Encounter (Signed)
See letter.

## 2012-09-09 NOTE — Telephone Encounter (Signed)
Letter Mail

## 2012-09-27 ENCOUNTER — Telehealth: Payer: Self-pay | Admitting: *Deleted

## 2012-09-27 DIAGNOSIS — M5416 Radiculopathy, lumbar region: Secondary | ICD-10-CM

## 2012-09-27 MED ORDER — GABAPENTIN 100 MG PO CAPS: ORAL_CAPSULE | ORAL | Status: DC

## 2012-09-27 NOTE — Telephone Encounter (Signed)
Rx has been refilled for gabapentin 100 mg #30 2 R. 09/27/2012

## 2012-10-08 ENCOUNTER — Ambulatory Visit (INDEPENDENT_AMBULATORY_CARE_PROVIDER_SITE_OTHER): Payer: 59 | Admitting: Family Medicine

## 2012-10-08 ENCOUNTER — Encounter: Payer: Self-pay | Admitting: Family Medicine

## 2012-10-08 VITALS — BP 140/72 | HR 97 | Temp 98.3°F | Wt 216.0 lb

## 2012-10-08 DIAGNOSIS — J209 Acute bronchitis, unspecified: Secondary | ICD-10-CM | POA: Insufficient documentation

## 2012-10-08 DIAGNOSIS — J9801 Acute bronchospasm: Secondary | ICD-10-CM

## 2012-10-08 MED ORDER — IPRATROPIUM-ALBUTEROL 0.5-2.5 (3) MG/3ML IN SOLN
3.0000 mL | Freq: Once | RESPIRATORY_TRACT | Status: AC
  Administered 2012-10-08: 3 mL via RESPIRATORY_TRACT

## 2012-10-08 MED ORDER — AZITHROMYCIN 250 MG PO TABS: ORAL_TABLET | ORAL | Status: DC

## 2012-10-08 MED ORDER — GABAPENTIN 100 MG PO CAPS: 100.0000 mg | ORAL_CAPSULE | Freq: Three times a day (TID) | ORAL | Status: DC

## 2012-10-08 MED ORDER — FLUTICASONE-SALMETEROL 250-50 MCG/DOSE IN AEPB: 1.0000 | INHALATION_SPRAY | Freq: Two times a day (BID) | RESPIRATORY_TRACT | Status: DC

## 2012-10-08 MED ORDER — HYDROCODONE-HOMATROPINE 5-1.5 MG/5ML PO SYRP: 5.0000 mL | ORAL_SOLUTION | Freq: Three times a day (TID) | ORAL | Status: DC | PRN

## 2012-10-08 NOTE — Patient Instructions (Addendum)

## 2012-10-08 NOTE — Progress Notes (Signed)
Subjective:     Alexa Buchanan is a 62 y.o. female here for evaluation of a cough. Onset of symptoms was several days ago. Symptoms have been gradually worsening since that time. The cough is productive and is aggravated by infection and reclining position. Associated symptoms include: postnasal drip, shortness of breath and wheezing. Patient does not have a history of asthma. Patient does have a history of environmental allergens. Patient has not traveled recently. Patient does have a history of smoking. Patient has not had a previous chest x-ray. Patient has not had a PPD done.  The following portions of the patient's history were reviewed and updated as appropriate:  She  has a past medical history of Diabetes mellitus; Migraines; Diverticulosis; IBS (irritable bowel syndrome); Dysmetabolic syndrome X; Histoplasmosis (1971); Depression; Stress incontinence, female (2004); Uterine prolapse; Seasonal allergies; and OSA (obstructive sleep apnea) (07/27/2011). She  does not have any pertinent problems on file. She  has past surgical history that includes Tonsillectomy; Dilation and curettage of uterus; Knee arthroscopy (1990); Wrist surgery (1988); Total abdominal hysterectomy w/ bilateral salpingoophorectomy (2003); Rectocele repair (2003); Vulva surgery; Gastric Plication (1981); Laser ablation of vascular lesion (2009); Vaginal hysterectomy; and Nose surgery. Her family history includes Breast cancer in her sister; Cancer in her father; Coronary artery disease in her maternal grandfather and maternal uncle; Diabetes in her maternal grandmother; Heart disease in her maternal grandfather; Hyperlipidemia in her mother; Hypertension in her mother and sister; Leukemia in her father; Multiple sclerosis in her mother; Stroke in her maternal grandfather; and Uterine cancer in her paternal aunt. She  reports that she has been smoking Cigarettes.  She has a 15 pack-year smoking history. She has never used  smokeless tobacco. She reports that she does not drink alcohol or use illicit drugs. She has a current medication list which includes the following prescription(s): calcium carbonate-vitamin d, vitamin d3, clonazepam, gabapentin, glucose blood, hydrocodone-acetaminophen, insulin pen needle, lancet device, liraglutide, metformin, multivitamin, nitrofurantoin, onetouch delica lancets 33g, oxybutynin, azithromycin, fluticasone-salmeterol, and hydrocodone-homatropine. Current Outpatient Prescriptions on File Prior to Visit  Medication Sig Dispense Refill  . Calcium Carbonate-Vitamin D (CALCIUM + D PO) Take by mouth 2 (two) times daily.       . Cholecalciferol (VITAMIN D3) 2000 UNITS TABS Take by mouth 2 (two) times daily.        . clonazePAM (KLONOPIN) 1 MG tablet Take 1 mg by mouth 2 (two) times daily. ONLY TAKES WHEN FLYING      . glucose blood (ONE TOUCH ULTRA TEST) test strip Check blood sugar once daily as directed DX 250.02, LABS OVERDUE  50 each  0  . Insulin Pen Needle (NOVOFINE) 32G X 6 MM MISC Use once daily with Victoza, DX: 250.00  100 each  3  . Lancet Device MISC by Does not apply route. One Touch Delica, test once daily       . Liraglutide (VICTOZA) 18 MG/3ML SOPN Inject 0.6 mg into the skin daily.  3 mL  5  . metFORMIN (GLUCOPHAGE-XR) 500 MG 24 hr tablet TAKE 1 TABLET DAILY  90 tablet  1  . Multiple Vitamin (MULTIVITAMIN) capsule Take 1 capsule by mouth daily. Centrum Silver.      . nitrofurantoin (MACRODANTIN) 50 MG capsule Take 50 mg by mouth daily.        [name] DELICA LANCETS 33G MISC 1 each by Other route as directed. Check blood sugar once daily as directed DX 250.02, LABS OVERDUE  100 each  0  .  oxybutynin (DITROPAN-XL) 10 MG 24 hr tablet Take 1 tablet by mouth Daily.       No current facility-administered medications on file prior to visit.   She is allergic to amoxicillin-pot clavulanate and other..  Review of Systems As above  Objective:    Oxygen saturation 96% on  room air BP 140/72  Pulse 97  Temp(Src) 98.3 F (36.8 C) (Oral)  Wt 216 lb (97.977 kg)  BMI 37.06 kg/m2  SpO2 96%  LMP 02/28/1995 General appearance: alert, cooperative, appears stated age and no distress Ears: normal TM's and external ear canals both ears Nose: Nares normal. Septum midline. Mucosa normal. No drainage or sinus tenderness. Throat: lips, mucosa, and tongue normal; teeth and gums normal Neck: mild anterior cervical adenopathy, supple, symmetrical, trachea midline and thyroid not enlarged, symmetric, no tenderness/mass/nodules Lungs: rhonchi bilaterally and wheezes bilaterally Heart: S1, S2 normal    Assessment:    Acute Bronchitis    Plan:    Antibiotics per medication orders. Antitussives per medication orders. Avoid exposure to tobacco smoke and fumes. B-agonist inhaler. Call if shortness of breath worsens, blood in sputum, change in character of cough, development of fever or chills, inability to maintain nutrition and hydration. Avoid exposure to tobacco smoke and fumes. Steroid inhaler as ordered.

## 2012-10-08 NOTE — Assessment & Plan Note (Signed)
zpack advair 250/50 1 inh bid proari prn Cough syrup per orders

## 2012-10-15 ENCOUNTER — Encounter: Payer: Self-pay | Admitting: Gynecology

## 2012-10-15 ENCOUNTER — Ambulatory Visit (INDEPENDENT_AMBULATORY_CARE_PROVIDER_SITE_OTHER): Payer: 59 | Admitting: Gynecology

## 2012-10-15 VITALS — BP 124/70 | Ht 65.0 in | Wt 200.0 lb

## 2012-10-15 DIAGNOSIS — Z01419 Encounter for gynecological examination (general) (routine) without abnormal findings: Secondary | ICD-10-CM

## 2012-10-15 DIAGNOSIS — N9089 Other specified noninflammatory disorders of vulva and perineum: Secondary | ICD-10-CM

## 2012-10-15 DIAGNOSIS — N952 Postmenopausal atrophic vaginitis: Secondary | ICD-10-CM

## 2012-10-15 NOTE — Progress Notes (Signed)
Patient ID: Alexa Buchanan, female   DOB: [birth year], 62 y.o.   MRN: [id number] Alexa Buchanan [birth year] [id number]        62 y.o.  [id number] for annual exam.  Former patient of Dr. Eda Paschal. Several issues noted below.  Past medical history,surgical history, medications, allergies, family history and social history were all reviewed and documented in the EPIC chart.  ROS:  Performed and pertinent positives and negatives are included in the history, assessment and plan .  Exam: Kim assistant Filed Vitals:   10/15/12 0859  BP: 124/70  Height: 5\' 5"  (1.651 m)  Weight: 200 lb (90.719 kg)   General appearance  Normal Skin grossly normal Head/Neck normal with no cervical or supraclavicular adenopathy thyroid normal Lungs  clear Cardiac RR, without RMG Abdominal  soft, nontender, without masses, organomegaly or hernia Breasts  examined lying and sitting without masses, retractions, discharge or axillary adenopathy. Pelvic  Ext/BUS/vagina  general atrophic changes. Raised pigmented area left lower labia majora. Separate area left posterior fourchette/perineal body.  Adnexa  Without masses or tenderness    Anus and perineum  normal   Rectovaginal  normal sphincter tone without palpated masses or tenderness.    Assessment/Plan:  62 y.o. [name] female for annual exam.   1. Status post TVH BSO rectocele repair with sling procedure. Exam shows good support. Not having significant postmenopausal symptoms. We'll continue to monitor. 2. Vulvar lesions.  Patient has 2 raised pigmented areas on her vulva that apparently had been followed in the past by Dr. Eda Paschal. She does have a history of seborrheic keratoses elsewhere that have been addressed by her dermatologist. Given the pigmented nature and difficulty in viewing these areas I have recommended that she have these areas excised and therefore we will no longer have to worry about them and she agrees with this and will make appointment to come  back to do so. 3. Mammography 03/2012. Continue with annual mammography. SBE monthly reviewed. She does have a history of breast cancer in a sister diagnosed in the mid 31s and the issue of genetic testing has been discussed with her in the past. The sister refuses to even discuss the situation with her family and refuses genetic testing. The patient does know that her sister received no subsequent therapy such as chemotherapy or radiation after a lumpectomy and the patient now questions whether the diagnosis really was cancer. She's going to try to find out more information about the whole situation. I did review with her that if it was cancer there could be a genetic linkage and if her sister refuses that the patient could go ahead and do BRCA testing regardless but she declines this at this point. 4. Pap smear 2012. No Pap smear done today. No history of abnormal Pap smears previously. Status post hysterectomy for benign indications. Options to stop screening altogether versus less frequent screening intervals reviewed. Will readdress on an annual basis. 5. DEXA 2012 normal. Recommend repeat at 5 year interval. Increase calcium vitamin D reviewed. 6. Colonoscopy 2010. Repeat at their recommended interval. 7. Health maintenance. No blood work done as it is all done through her primary physician's office.  Note: This document was prepared with digital dictation and possible smart phrase technology. Any transcriptional errors that result from this process are unintentional.   Dara Lords MD, 9:59 AM 10/15/2012

## 2012-10-15 NOTE — Patient Instructions (Signed)
Followup for vulvar biopsy as we discussed.  Consider Stop Smoking.  Help is available at [facility] smoking cessation program @ www.[place].com or 661-670-0572. OR 1-800-QUIT-NOW 251-648-9033) for free smoking cessation counseling.  Smokefree.gov (http://www.[place].com/) provides free, accurate, evidence-based information and professional assistance to help support the immediate and long-term needs of people trying to quit smoking.    Smoking Hazards Smoking cigarettes is extremely bad for your health. Tobacco smoke has over 200 known poisons in it. There are over 60 chemicals in tobacco smoke that cause cancer. Some of the chemicals found in cigarette smoke include:  Cyanide.  Benzene.  Formaldehyde.  Methanol (wood alcohol).  Acetylene (fuel used in welding torches).  Ammonia.  Cigarette smoke also contains the poisonous gases nitrogen oxide and carbon monoxide.  Cigarette smokers have an increased risk of many serious medical problems, including: Lung cancer.  Lung disease (such as pneumonia, bronchitis, and emphysema).  Heart attack and chest pain due to the heart not getting enough oxygen (angina).  Heart disease and peripheral blood vessel disease.  Hypertension.  Stroke.  Oral cancer (cancer of the lip, mouth, or voice box).  Bladder cancer.  Pancreatic cancer.  Cervical cancer.  Pregnancy complications, including premature birth.  Low birthweight babies.  Early menopause.  Lower estrogen level for women.  Infertility.  Facial wrinkles.  Blindness.  Increased risk of broken bones (fractures).  Senile dementia.  Stillbirths and smaller newborn babies, birth defects, and genetic damage to sperm.  Stomach ulcers and internal bleeding.  Children of smokers have an increased risk of the following, because of secondhand smoke exposure:  Sudden infant death syndrome (SIDS).  Respiratory infections.  Lung cancer.  Heart disease.  Ear infections.  Smoking  causes approximately: 90% of all lung cancer deaths in men.  80% of all lung cancer deaths in women.  90% of deaths from chronic obstructive lung disease.  Compared with nonsmokers, smoking increases the risk of: Coronary heart disease by 2 to 4 times.  Stroke by 2 to 4 times.  Men developing lung cancer by 23 times.  Women developing lung cancer by 13 times.  Dying from chronic obstructive lung diseases by 12 times.  Someone who smokes 2 packs a day loses about 8 years of his or her life. Even smoking lightly shortens your life expectancy by several years. You can greatly reduce the risk of medical problems for you and your family by stopping now. Smoking is the most preventable cause of death and disease in our society. Within days of quitting smoking, your circulation returns to normal, you decrease the risk of having a heart attack, and your lung capacity improves. There may be some increased phlegm in the first few days after quitting, and it may take months for your lungs to clear up completely. Quitting for 10 years cuts your lung cancer risk to almost that of a nonsmoker. WHY IS SMOKING ADDICTIVE? Nicotine is the chemical agent in tobacco that is capable of causing addiction or dependence.  When you smoke and inhale, nicotine is absorbed rapidly into the bloodstream through your lungs. Nicotine absorbed through the lungs is capable of creating a powerful addiction. Both inhaled and non-inhaled nicotine may be addictive.  Addiction studies of cigarettes and spit tobacco show that addiction to nicotine occurs mainly during the teen years, when young people begin using tobacco products.  WHAT ARE THE BENEFITS OF QUITTING?  There are many health benefits to quitting smoking.  Likelihood of developing cancer and heart disease  decreases. Health improvements are seen almost immediately.  Blood pressure, pulse rate, and breathing patterns start returning to normal soon after quitting.  People who  quit may see an improvement in their overall quality of life.  Some people choose to quit all at once. Other options include nicotine replacement products, such as patches, gum, and nasal sprays. Do not use these products without first checking with your caregiver. QUITTING SMOKING It is not easy to quit smoking. Nicotine is addicting, and longtime habits are hard to change. To start, you can write down all your reasons for quitting, tell your family and friends you want to quit, and ask for their help. Throw your cigarettes away, chew gum or cinnamon sticks, keep your hands busy, and drink extra water or juice. Go for walks and practice deep breathing to relax. Think of all the money you are saving: around $1,000 a year, for the average pack-a-day smoker. Nicotine patches and gum have been shown to improve success at efforts to stop smoking. Zyban (bupropion) is an anti-depressant drug that can be prescribed to reduce nicotine withdrawal symptoms and to suppress the urge to smoke. Smoking is an addiction with both physical and psychological effects. Joining a stop-smoking support group can help you cope with the emotional issues. For more information and advice on programs to stop smoking, call your doctor, your local hospital, or these organizations: American Lung Association - 1-800-LUNGUSA   Smoking Cessation  This document explains the best ways for you to quit smoking and new treatments to help. It lists new medicines that can double or triple your chances of quitting and quitting for good. It also considers ways to avoid relapses and concerns you may have about quitting, including weight gain. NICOTINE: A POWERFUL ADDICTION If you have tried to quit smoking, you know how hard it can be. It is hard because nicotine is a very addictive drug. For some people, it can be as addictive as heroin or cocaine. Usually, people make 2 or 3 tries, or more, before finally being able to quit. Each time you try to  quit, you can learn about what helps and what hurts. Quitting takes hard work and a lot of effort, but you can quit smoking. QUITTING SMOKING IS ONE OF THE MOST IMPORTANT THINGS YOU WILL EVER DO.  You will live longer, feel better, and live better.   The impact on your body of quitting smoking is felt almost immediately:   Within 20 minutes, blood pressure decreases. Pulse returns to its normal level.   After 8 hours, carbon monoxide levels in the blood return to normal. Oxygen level increases.   After 24 hours, chance of heart attack starts to decrease. Breath, hair, and body stop smelling like smoke.   After 48 hours, damaged nerve endings begin to recover. Sense of taste and smell improve.   After 72 hours, the body is virtually free of nicotine. Bronchial tubes relax and breathing becomes easier.   After 2 to 12 weeks, lungs can hold more air. Exercise becomes easier and circulation improves.   Quitting will reduce your risk of having a heart attack, stroke, cancer, or lung disease:   After 1 year, the risk of coronary heart disease is cut in half.   After 5 years, the risk of stroke falls to the same as a nonsmoker.   After 10 years, the risk of lung cancer is cut in half and the risk of other cancers decreases significantly.   After 15 years,  the risk of coronary heart disease drops, usually to the level of a nonsmoker.   If you are pregnant, quitting smoking will improve your chances of having a healthy baby.   The people you live with, especially your children, will be healthier.   You will have extra money to spend on things other than cigarettes.  FIVE KEYS TO QUITTING Studies have shown that these 5 steps will help you quit smoking and quit for good. You have the best chances of quitting if you use them together: 1. Get ready.  2. Get support and encouragement.  3. Learn new skills and behaviors.  4. Get medicine to reduce your nicotine addiction and use it  correctly.  5. Be prepared for relapse or difficult situations. Be determined to continue trying to quit, even if you do not succeed at first.  1. GET READY  Set a quit date.   Change your environment.   Get rid of ALL cigarettes, ashtrays, matches, and lighters in your home, car, and place of work.   Do not let people smoke in your home.   Review your past attempts to quit. Think about what worked and what did not.   Once you quit, do not smoke. NOT EVEN A PUFF!  2. GET SUPPORT AND ENCOURAGEMENT Studies have shown that you have a better chance of being successful if you have help. You can get support in many ways.  Tell your family, friends, and coworkers that you are going to quit and need their support. Ask them not to smoke around you.   Talk to your caregivers (doctor, dentist, nurse, pharmacist, psychologist, and/or smoking counselor).   Get individual, group, or telephone counseling and support. The more counseling you have, the better your chances are of quitting. Programs are available at local hospitals and health centers. Call your local health department for information about programs in your area.   Spiritual beliefs and practices may help some smokers quit.   Quit meters are small computer programs online or downloadable that keep track of quit statistics, such as amount of "quit-time," cigarettes not smoked, and money saved.   Many smokers find one or more of the many self-help books available useful in helping them quit and stay off tobacco.  3. LEARN NEW SKILLS AND BEHAVIORS  Try to distract yourself from urges to smoke. Talk to someone, go for a walk, or occupy your time with a task.   When you first try to quit, change your routine. Take a different route to work. Drink tea instead of coffee. Eat breakfast in a different place.   Do something to reduce your stress. Take a hot bath, exercise, or read a book.   Plan something enjoyable to do every day. Reward  yourself for not smoking.   Explore interactive web-based programs that specialize in helping you quit.  4. GET MEDICINE AND USE IT CORRECTLY Medicines can help you stop smoking and decrease the urge to smoke. Combining medicine with the above behavioral methods and support can quadruple your chances of successfully quitting smoking. The U.[place]. Food and Drug Administration (FDA) has approved 7 medicines to help you quit smoking. These medicines fall into 3 categories.  Nicotine replacement therapy (delivers nicotine to your body without the negative effects and risks of smoking):   Nicotine gum: Available over-the-counter.   Nicotine lozenges: Available over-the-counter.   Nicotine inhaler: Available by prescription.   Nicotine nasal spray: Available by prescription.   Nicotine skin patches (transdermal): Available   by prescription and over-the-counter.   Antidepressant medicine (helps people abstain from smoking, but how this works is unknown):   Bupropion sustained-release (SR) tablets: Available by prescription.   Nicotinic receptor partial agonist (simulates the effect of nicotine in your brain):   Varenicline tartrate tablets: Available by prescription.   Ask your caregiver for advice about which medicines to use and how to use them. Carefully read the information on the package.   Everyone who is trying to quit may benefit from using a medicine. If you are pregnant or trying to become pregnant, nursing an infant, you are under age 18, or you smoke fewer than 10 cigarettes per day, talk to your caregiver before taking any nicotine replacement medicines.   You should stop using a nicotine replacement product and call your caregiver if you experience nausea, dizziness, weakness, vomiting, fast or irregular heartbeat, mouth problems with the lozenge or gum, or redness or swelling of the skin around the patch that does not go away.   Do not use any other product containing nicotine  while using a nicotine replacement product.   Talk to your caregiver before using these products if you have diabetes, heart disease, asthma, stomach ulcers, you had a recent heart attack, you have high blood pressure that is not controlled with medicine, a history of irregular heartbeat, or you have been prescribed medicine to help you quit smoking.  5. BE PREPARED FOR RELAPSE OR DIFFICULT SITUATIONS  Most relapses occur within the first 3 months after quitting. Do not be discouraged if you start smoking again. Remember, most people try several times before they finally quit.   You may have symptoms of withdrawal because your body is used to nicotine. You may crave cigarettes, be irritable, feel very hungry, cough often, get headaches, or have difficulty concentrating.   The withdrawal symptoms are only temporary. They are strongest when you first quit, but they will go away within 10 to 14 days.  Here are some difficult situations to watch for:  Alcohol. Avoid drinking alcohol. Drinking lowers your chances of successfully quitting.   Caffeine. Try to reduce the amount of caffeine you consume. It also lowers your chances of successfully quitting.   Other smokers. Being around smoking can make you want to smoke. Avoid smokers.   Weight gain. Many smokers will gain weight when they quit, usually less than 10 pounds. Eat a healthy diet and stay active. Do not let weight gain distract you from your main goal, quitting smoking. Some medicines that help you quit smoking may also help delay weight gain. You can always lose the weight gained after you quit.   Bad mood or depression. There are a lot of ways to improve your mood other than smoking.  If you are having problems with any of these situations, talk to your caregiver. SPECIAL SITUATIONS AND CONDITIONS Studies suggest that everyone can quit smoking. Your situation or condition can give you a special reason to quit.  Pregnant women/new  mothers: By quitting, you protect your baby's health and your own.   Hospitalized patients: By quitting, you reduce health problems and help healing.   Heart attack patients: By quitting, you reduce your risk of a second heart attack.   Lung, head, and neck cancer patients: By quitting, you reduce your chance of a second cancer.   Parents of children and adolescents: By quitting, you protect your children from illnesses caused by secondhand smoke.  QUESTIONS TO THINK ABOUT Think about the following questions   before you try to stop smoking. You may want to talk about your answers with your caregiver.  Why do you want to quit?   If you tried to quit in the past, what helped and what did not?   What will be the most difficult situations for you after you quit? How will you plan to handle them?   Who can help you through the tough times? Your family? Friends? Caregiver?   What pleasures do you get from smoking? What ways can you still get pleasure if you quit?  Here are some questions to ask your caregiver:  How can you help me to be successful at quitting?   What medicine do you think would be best for me and how should I take it?   What should I do if I need more help?   What is smoking withdrawal like? How can I get information on withdrawal?  Quitting takes hard work and a lot of effort, but you can quit smoking.   

## 2012-10-24 ENCOUNTER — Encounter: Payer: Self-pay | Admitting: Gynecology

## 2012-10-24 ENCOUNTER — Telehealth: Payer: Self-pay | Admitting: Internal Medicine

## 2012-10-24 ENCOUNTER — Ambulatory Visit (INDEPENDENT_AMBULATORY_CARE_PROVIDER_SITE_OTHER): Payer: 59 | Admitting: Gynecology

## 2012-10-24 DIAGNOSIS — N9089 Other specified noninflammatory disorders of vulva and perineum: Secondary | ICD-10-CM

## 2012-10-24 MED ORDER — OXYCODONE-ACETAMINOPHEN 5-325 MG PO TABS: 1.0000 | ORAL_TABLET | ORAL | Status: DC | PRN

## 2012-10-24 MED ORDER — PROMETHAZINE-DM 6.25-15 MG/5ML PO SYRP: 5.0000 mL | ORAL_SOLUTION | Freq: Four times a day (QID) | ORAL | Status: DC | PRN

## 2012-10-24 NOTE — Telephone Encounter (Signed)
Ok for promethazine-dextromethorphan syrup, 5 ml Q6 PRN, #240.  If not improving, needs OV

## 2012-10-24 NOTE — Telephone Encounter (Signed)
Med filled.  

## 2012-10-24 NOTE — Progress Notes (Signed)
Patient ID: Alexa Buchanan, female   DOB: Jan 19, 1951, 62 y.o.   MRN: [id number] Patient presents for excision of the 2 pigmented vulvar lesions described at her annual visit 10/15/2012.  Exam was Administrator, [employer] vagina with atrophic changes. Large raised pigmented lesion lower left labia majora. Small pigmented flat lesion left of posterior fourchette upper perineal body. Physical Exam  Genitourinary:      Procedure: Both areas were cleansed with Betadine and infiltrated with subcutaneous 1% lidocaine. #1 lesion was excised and Silver nitrate applied afterwards for hemostasis. #2 lesion was excised and skin defect closed using 3-0 Vicryl and interrupted cutaneous stitch.  Assessment and plan: Pigmented lesions of the vulva both excised. Patient will followup for pathology results in several days. Postoperative care given with call precautions reviewed. Oxycodone 5.0/325 #20 provided

## 2012-10-24 NOTE — Addendum Note (Signed)
Addended by: Dara Lords on: 10/24/2012 04:10 PM   Modules accepted: Orders

## 2012-10-24 NOTE — Addendum Note (Signed)
Addended by: Jackson Latino on: 10/24/2012 04:20 PM   Modules accepted: Orders

## 2012-10-24 NOTE — Telephone Encounter (Signed)
Patient is calling to request a refill on her cough syrup. States that her symptoms are not improving much and wants to know if there is something else she should be doing. Patient saw Dr. Laury Axon for her cough on 10/08/12.  Please advise.

## 2012-10-24 NOTE — Patient Instructions (Signed)
Use sitz baths and a hairdryer afterwards for perineal care. Office will call you with the biopsy results

## 2012-10-24 NOTE — Telephone Encounter (Signed)
Please advise      KP 

## 2012-11-13 ENCOUNTER — Telehealth: Payer: Self-pay | Admitting: General Practice

## 2012-11-13 NOTE — Telephone Encounter (Signed)
   Please change dose to 1.2 units daily from 0.6

## 2012-11-13 NOTE — Telephone Encounter (Signed)
Please advise [facility] pharmacy called requesting a verbal ok to refill the pt's victoza early. Per our records it states that the pt should be on 0.6mg . Pt husband told pharmacy taht his wife ran out of insulin because her dosage had increased. Please advise what dose she should currently be on.   [facility] [employer] [phone]

## 2012-11-15 ENCOUNTER — Telehealth: Payer: Self-pay | Admitting: *Deleted

## 2012-11-15 NOTE — Telephone Encounter (Signed)
Gave new directions to pharmacy.

## 2012-11-18 ENCOUNTER — Telehealth: Payer: Self-pay | Admitting: Internal Medicine

## 2012-11-18 NOTE — Telephone Encounter (Signed)
Error

## 2012-11-18 NOTE — Telephone Encounter (Signed)
Patient's husband states that they are concerned about the price of Victoza and would like to discuss other options. He also says that they will be out of town in October and would like to discuss an extra rx. Please advise.

## 2012-11-20 NOTE — Telephone Encounter (Signed)
Check your plan to define all diabetic medication options and cost. Medication choice will be based on efficacy and safety and lastly based on cost. The Victoza has resulted in significant improvement in the A1c. Please see me with your formulary prior to refilling the Victoza.

## 2012-11-21 NOTE — Telephone Encounter (Signed)
Spoke with the pt's husband and informed him of Dr. Frederik Pear note below.  He stated he was not sure of the meds that are covered but they will get that information and schedule an appt with Dr. Lavell Islam

## 2012-12-02 ENCOUNTER — Encounter: Payer: Self-pay | Admitting: Lab

## 2012-12-03 ENCOUNTER — Ambulatory Visit (INDEPENDENT_AMBULATORY_CARE_PROVIDER_SITE_OTHER): Payer: 59 | Admitting: Internal Medicine

## 2012-12-03 ENCOUNTER — Encounter: Payer: Self-pay | Admitting: Internal Medicine

## 2012-12-03 VITALS — BP 128/77 | HR 75 | Temp 98.3°F | Resp 12 | Wt 204.6 lb

## 2012-12-03 DIAGNOSIS — Z862 Personal history of diseases of the blood and blood-forming organs and certain disorders involving the immune mechanism: Secondary | ICD-10-CM

## 2012-12-03 DIAGNOSIS — E781 Pure hyperglyceridemia: Secondary | ICD-10-CM

## 2012-12-03 DIAGNOSIS — IMO0001 Reserved for inherently not codable concepts without codable children: Secondary | ICD-10-CM

## 2012-12-03 DIAGNOSIS — R030 Elevated blood-pressure reading, without diagnosis of hypertension: Secondary | ICD-10-CM

## 2012-12-03 DIAGNOSIS — Z8639 Personal history of other endocrine, nutritional and metabolic disease: Secondary | ICD-10-CM

## 2012-12-03 DIAGNOSIS — Z794 Long term (current) use of insulin: Secondary | ICD-10-CM | POA: Insufficient documentation

## 2012-12-03 LAB — LIPID PANEL
HDL: 35.9 mg/dL — ABNORMAL LOW (ref >39.00)
Total CHOL/HDL Ratio: 5
Triglycerides: 329 mg/dL — ABNORMAL HIGH (ref 0.0–149.0)
VLDL: 65.8 mg/dL — ABNORMAL HIGH (ref 0.0–40.0)

## 2012-12-03 LAB — HEPATIC FUNCTION PANEL
ALT: 32 U/L (ref 0–35)
AST: 22 U/L (ref 0–37)
Albumin: 4.7 g/dL (ref 3.5–5.2)
Alkaline Phosphatase: 83 U/L (ref 39–117)
Bilirubin, Direct: 0 mg/dL (ref 0.0–0.3)
Total Protein: 8 g/dL (ref 6.0–8.3)

## 2012-12-03 LAB — BASIC METABOLIC PANEL
CO2: 25 mEq/L (ref 19–32)
Calcium: 9.7 mg/dL (ref 8.4–10.5)
Chloride: 106 mEq/L (ref 96–112)
Glucose, Bld: 104 mg/dL — ABNORMAL HIGH (ref 70–99)
Potassium: 4.1 mEq/L (ref 3.5–5.1)
Sodium: 144 mEq/L (ref 135–145)

## 2012-12-03 LAB — LDL CHOLESTEROL, DIRECT: Direct LDL: 105.2 mg/dL

## 2012-12-03 NOTE — Assessment & Plan Note (Signed)
LFTs 

## 2012-12-03 NOTE — Patient Instructions (Addendum)
Monitor the glucose before eating  (fasting blood sugar) Monday, Wednesday, Friday, and Sunday. This should range from 100-150. Check glucose 2 hours after  breakfast on Tuesday; 2 hours after lunch on Thursday; and 2 hours after the meal on Saturday. This value should average  less than 180, ideally less than 160. 

## 2012-12-03 NOTE — Progress Notes (Signed)
Subjective:    Patient ID: Alexa Buchanan, female    DOB: 12-10-1950, 62 y.o.   MRN: [id number]  HPI The Victoza had been of benefit but there were a number of "stumbling blocks" related to insurance coverage. The prescription was originally written for 0.6 units to be titrated up to 1.8. The pharmacy did not recognize this titration schedule and therefore she ran out of the agent early. They are not covering this because she believes there was some altercation between her insurance coverage & the company but makes the drug. He will cost her $40 a month.  She has plan costing $1200 a month for insurance coverage. Using this agent which she feels been of benefit particularly in relation to weight loss(20#)  is not an option until March 2015 which she is on Medicare.  She did question whether she had some bleeding of her gums and nail recession/infection related to this agent. It also causes nausea in the context of early satiety. The nausea was self-limited.  Her most recent A1c was 7.7% in July 2014. Her triglycerides at that time were 331, HDL 37.1, and LDL 95.1. Her TSH was therapeutic  Her fasting glucoses ranges from 110-125. Highest glucose 2 hours after any meal was 130.  She remains a high protein, low carb diet. Portions have decreased in size.  Exercise includes walking 1 mile 4 times a week on a treadmill.  Ophthalmologic exam is up to date with no retinopathy.      Review of Systems  No hypoglycemia reported                                                    No excess thirst ;  excess hunger ; or excess urination reported                              No lightheadedness with standing reported No chest pain ;  claudication described. Rare stress related palpitations .                                                                                                                             No non healing skin  ulcers or sores of extremities noted. Seborrheic keratosis removed by  Gyn No numbness or tingling or burning in feet described . Occasional stinging pain @ night; this resolves with walking  Weight loss as noted . No blurred,double, or loss of vision reported  .         Objective:   Physical Exam  Gen.: Healthy  & well-nourished, appropriate and alert, weight Eyes: No lid/conjunctival changes, extraocular motion intact Neck: Normal range of motion, thyroid normal Respiratory: No increased work of breathing or abnormal breath sounds Cardiac : regular rhythm, no extra heart sounds, gallop,no murmur Abdomen: No organomegaly ,masses, bruits or aortic enlargement Lymph: No lymphadenopathy of the neck or axilla Skin: No rashes, lesions, ulcers or ischemic changes Muscle skeletal: no nail changes; joints normal Vasc:All pulses intact, no bruits present Neuro: Normal deep tendon reflexes, alert & oriented, sensation over feet normal Psych: judgment and insight, mood and affect normal. Appropriately frustrated        Assessment & Plan:  See Current Assessment & Plan in Problem List under specific Diagnosis

## 2012-12-03 NOTE — Assessment & Plan Note (Signed)
Lipids, LFTs 

## 2012-12-03 NOTE — Assessment & Plan Note (Signed)
BMET 

## 2012-12-03 NOTE — Assessment & Plan Note (Signed)
A1c & urine microalbumin  

## 2012-12-05 ENCOUNTER — Other Ambulatory Visit: Payer: Self-pay | Admitting: Internal Medicine

## 2012-12-06 ENCOUNTER — Other Ambulatory Visit: Payer: Self-pay | Admitting: *Deleted

## 2012-12-06 MED ORDER — METFORMIN HCL ER 500 MG PO TB24: ORAL_TABLET | ORAL | Status: DC

## 2012-12-06 NOTE — Telephone Encounter (Signed)
Metformin refill sent to pharmacy

## 2012-12-12 ENCOUNTER — Telehealth: Payer: Self-pay | Admitting: *Deleted

## 2012-12-13 NOTE — Telephone Encounter (Signed)
error 

## 2012-12-23 ENCOUNTER — Other Ambulatory Visit: Payer: Self-pay | Admitting: Family Medicine

## 2012-12-26 ENCOUNTER — Telehealth: Payer: Self-pay | Admitting: Internal Medicine

## 2012-12-26 NOTE — Telephone Encounter (Signed)
Patient's husband called upset because he left a message on triage line about his wife needing Victoza samples on Tuesday and has not had a call back.. Please advise if Victoza samples are available and call patient's husband. Ph#: [phone]

## 2012-12-27 NOTE — Telephone Encounter (Signed)
Called and spoke with the pt's husband and informed him that we do have a sample of Victoza available and he can pick it up.  He agreed.//AB/CMA

## 2013-01-02 ENCOUNTER — Other Ambulatory Visit: Payer: Self-pay

## 2013-01-03 ENCOUNTER — Telehealth: Payer: Self-pay | Admitting: *Deleted

## 2013-01-03 ENCOUNTER — Other Ambulatory Visit: Payer: Self-pay | Admitting: *Deleted

## 2013-01-03 MED ORDER — GABAPENTIN 100 MG PO CAPS: 100.0000 mg | ORAL_CAPSULE | Freq: Three times a day (TID) | ORAL | Status: DC

## 2013-01-03 NOTE — Telephone Encounter (Signed)
Medication refilled

## 2013-01-03 NOTE — Telephone Encounter (Signed)
Gabapentin refilled

## 2013-01-03 NOTE — Telephone Encounter (Signed)
01/03/2013  Pt husband called, pt needs refill on gabapentin (NEURONTIN) 100 MG capsule.  Pharmacy states we have not responded to refill request.  Needs to be for 90 day supply, sent to  [facility].  Thank you.  bw

## 2013-01-31 ENCOUNTER — Telehealth: Payer: Self-pay | Admitting: Internal Medicine

## 2013-01-31 NOTE — Telephone Encounter (Signed)
Pt's husband was given a sample of Victoza per Dr. Lavell Islam

## 2013-01-31 NOTE — Telephone Encounter (Signed)
Patient's husband states that his wife will not have enough Victoza to last her until Sunday and would like to know what Dr. Alwyn Ren would like for her to do until she is able to get more from our office. Please advise.

## 2013-01-31 NOTE — Telephone Encounter (Signed)
Patient's husband is calling to request more Victoza samples. She states that she will not be able to afford them until she gets on Medicare. Please advise and call husband when ready.

## 2013-02-09 ENCOUNTER — Other Ambulatory Visit: Payer: Self-pay | Admitting: Internal Medicine

## 2013-02-11 NOTE — Telephone Encounter (Signed)
Med filled.  

## 2013-02-28 ENCOUNTER — Other Ambulatory Visit: Payer: Self-pay | Admitting: *Deleted

## 2013-02-28 DIAGNOSIS — IMO0002 Reserved for concepts with insufficient information to code with codable children: Secondary | ICD-10-CM

## 2013-02-28 DIAGNOSIS — E1342 Other specified diabetes mellitus with diabetic polyneuropathy: Secondary | ICD-10-CM

## 2013-02-28 DIAGNOSIS — E1365 Other specified diabetes mellitus with hyperglycemia: Principal | ICD-10-CM

## 2013-02-28 MED ORDER — LIRAGLUTIDE 18 MG/3ML ~~LOC~~ SOPN: 1.8000 mg | PEN_INJECTOR | Freq: Every day | SUBCUTANEOUS | Status: DC

## 2013-03-06 ENCOUNTER — Other Ambulatory Visit: Payer: Self-pay

## 2013-03-06 DIAGNOSIS — Z1231 Encounter for screening mammogram for malignant neoplasm of breast: Secondary | ICD-10-CM

## 2013-03-20 ENCOUNTER — Encounter: Payer: Self-pay | Admitting: Internal Medicine

## 2013-03-23 ENCOUNTER — Other Ambulatory Visit: Payer: Self-pay | Admitting: Internal Medicine

## 2013-03-24 NOTE — Telephone Encounter (Signed)
Gabapentin refilled per protocol. JG//CMA 

## 2013-04-03 ENCOUNTER — Other Ambulatory Visit: Payer: Self-pay | Admitting: Internal Medicine

## 2013-04-04 ENCOUNTER — Other Ambulatory Visit: Payer: Self-pay | Admitting: Internal Medicine

## 2013-04-04 NOTE — Telephone Encounter (Signed)
Rx was denied because the request is to soon.//AB/CMA

## 2013-04-07 NOTE — Telephone Encounter (Signed)
Rx sent to the pharmacy by e-script.  Pt needs labs (HgbA1c) drawn.//AB/CMA

## 2013-04-08 ENCOUNTER — Ambulatory Visit
Admission: RE | Admit: 2013-04-08 | Discharge: 2013-04-08 | Disposition: A | Discharge status: Home / Self Care | Payer: BC Managed Care – PPO | Source: Ambulatory Visit

## 2013-04-08 DIAGNOSIS — Z1231 Encounter for screening mammogram for malignant neoplasm of breast: Secondary | ICD-10-CM

## 2013-05-09 ENCOUNTER — Other Ambulatory Visit: Payer: Self-pay | Admitting: *Deleted

## 2013-05-09 MED ORDER — INSULIN PEN NEEDLE 32G X 6 MM MISC: Status: DC

## 2013-05-09 NOTE — Telephone Encounter (Signed)
Rx sent to the pharmacy by e-script.//AB/CMA 

## 2013-05-12 ENCOUNTER — Other Ambulatory Visit: Payer: Self-pay | Admitting: Internal Medicine

## 2013-05-13 NOTE — Telephone Encounter (Signed)
Rx sent to the pharmacy by e-script.  Pt needs complete physical and fasting labs.//AB/CMA 

## 2013-06-16 ENCOUNTER — Other Ambulatory Visit: Payer: Self-pay

## 2013-06-16 MED ORDER — METFORMIN HCL ER 500 MG PO TB24: ORAL_TABLET | ORAL | Status: DC

## 2013-06-16 MED ORDER — GABAPENTIN 100 MG PO CAPS: ORAL_CAPSULE | ORAL | Status: DC

## 2013-06-22 ENCOUNTER — Other Ambulatory Visit: Payer: Self-pay | Admitting: Internal Medicine

## 2013-06-22 DIAGNOSIS — IMO0001 Reserved for inherently not codable concepts without codable children: Secondary | ICD-10-CM

## 2013-06-22 DIAGNOSIS — E1342 Other specified diabetes mellitus with diabetic polyneuropathy: Secondary | ICD-10-CM

## 2013-06-22 DIAGNOSIS — R030 Elevated blood-pressure reading, without diagnosis of hypertension: Secondary | ICD-10-CM

## 2013-06-22 DIAGNOSIS — IMO0002 Reserved for concepts with insufficient information to code with codable children: Secondary | ICD-10-CM

## 2013-06-22 DIAGNOSIS — Z862 Personal history of diseases of the blood and blood-forming organs and certain disorders involving the immune mechanism: Secondary | ICD-10-CM

## 2013-06-22 DIAGNOSIS — E781 Pure hyperglyceridemia: Secondary | ICD-10-CM

## 2013-06-22 DIAGNOSIS — Z8639 Personal history of other endocrine, nutritional and metabolic disease: Secondary | ICD-10-CM

## 2013-06-22 DIAGNOSIS — E1165 Type 2 diabetes mellitus with hyperglycemia: Secondary | ICD-10-CM

## 2013-06-22 DIAGNOSIS — E1365 Other specified diabetes mellitus with hyperglycemia: Principal | ICD-10-CM

## 2013-06-24 ENCOUNTER — Other Ambulatory Visit: Payer: Self-pay

## 2013-06-24 MED ORDER — GABAPENTIN 100 MG PO CAPS: ORAL_CAPSULE | ORAL | Status: DC

## 2013-06-24 NOTE — Telephone Encounter (Signed)
Instructions for Gabapentin updated

## 2013-07-09 ENCOUNTER — Other Ambulatory Visit: Payer: Self-pay

## 2013-07-09 NOTE — Telephone Encounter (Signed)
OK X1 

## 2013-07-09 NOTE — Telephone Encounter (Signed)
Last ov 12/03/12

## 2013-07-12 MED ORDER — CLONAZEPAM 1 MG PO TABS
1.0000 mg | ORAL_TABLET | Freq: Two times a day (BID) | ORAL | Status: DC
Start: ? — End: 2015-11-15

## 2013-07-14 NOTE — Telephone Encounter (Signed)
Faxed script back to [facility].../lmb 

## 2013-09-12 ENCOUNTER — Telehealth: Payer: Self-pay

## 2013-09-12 DIAGNOSIS — Z0000 Encounter for general adult medical examination without abnormal findings: Secondary | ICD-10-CM

## 2013-09-12 NOTE — Telephone Encounter (Signed)
Pt contacted to schedule CPX per diabetic bundle, prior labs expired 06/2013. Appt scheduled and labs reordered so that pt may have done prior to appt.

## 2013-09-22 ENCOUNTER — Encounter: Payer: Self-pay | Admitting: Internal Medicine

## 2013-10-03 ENCOUNTER — Encounter: Payer: Self-pay | Admitting: Internal Medicine

## 2013-10-16 ENCOUNTER — Encounter: Payer: Self-pay | Admitting: Gynecology

## 2013-10-16 ENCOUNTER — Ambulatory Visit (INDEPENDENT_AMBULATORY_CARE_PROVIDER_SITE_OTHER): Payer: Medicare Other | Admitting: Gynecology

## 2013-10-16 ENCOUNTER — Other Ambulatory Visit (HOSPITAL_COMMUNITY)
Admission: RE | Admit: 2013-10-16 | Discharge: 2013-10-16 | Disposition: A | Discharge status: Home / Self Care | Payer: BC Managed Care – PPO | Source: Ambulatory Visit | Attending: Gynecology | Admitting: Gynecology

## 2013-10-16 VITALS — BP 112/70 | Ht 63.0 in | Wt 199.0 lb

## 2013-10-16 DIAGNOSIS — N952 Postmenopausal atrophic vaginitis: Secondary | ICD-10-CM

## 2013-10-16 DIAGNOSIS — Z01419 Encounter for gynecological examination (general) (routine) without abnormal findings: Secondary | ICD-10-CM | POA: Diagnosis present

## 2013-10-16 DIAGNOSIS — Z1272 Encounter for screening for malignant neoplasm of vagina: Secondary | ICD-10-CM

## 2013-10-16 LAB — URINALYSIS W MICROSCOPIC + REFLEX CULTURE
Bacteria, UA: NONE SEEN
Bilirubin Urine: NEGATIVE
CASTS: NONE SEEN
GLUCOSE, UA: NEGATIVE mg/dL
Hgb urine dipstick: NEGATIVE
KETONES UR: NEGATIVE mg/dL
Leukocytes, UA: NEGATIVE
Nitrite: NEGATIVE
PH: 5.5 (ref 5.0–8.0)
Protein, ur: NEGATIVE mg/dL
SPECIFIC GRAVITY, URINE: 1.021 (ref 1.005–1.030)
SQUAMOUS EPITHELIAL / LPF: NONE SEEN
Urobilinogen, UA: 0.2 mg/dL (ref 0.0–1.0)

## 2013-10-16 NOTE — Patient Instructions (Signed)
Followup in one year for annual exam, sooner if any issues.  Consider Stop Smoking.  Help is available at [facility] smoking cessation program @ www.[place].com or 732-604-1243. OR 1-800-QUIT-NOW 857 699 0185) for free smoking cessation counseling.  Smokefree.gov (Inrails.tn) provides free, accurate, evidence-based information and professional assistance to help support the immediate and long-term needs of people trying to quit smoking.    Smoking Hazards Smoking cigarettes is extremely bad for your health. Tobacco smoke has over 200 known poisons in it. There are over 60 chemicals in tobacco smoke that cause cancer. Some of the chemicals found in cigarette smoke include:  Cyanide.  Benzene.  Formaldehyde.  Methanol (wood alcohol).  Acetylene (fuel used in welding torches).  Ammonia.  Cigarette smoke also contains the poisonous gases nitrogen oxide and carbon monoxide.  Cigarette smokers have an increased risk of many serious medical problems, including: Lung cancer.  Lung disease (such as pneumonia, bronchitis, and emphysema).  Heart attack and chest pain due to the heart not getting enough oxygen (angina).  Heart disease and peripheral blood vessel disease.  Hypertension.  Stroke.  Oral cancer (cancer of the lip, mouth, or voice box).  Bladder cancer.  Pancreatic cancer.  Cervical cancer.  Pregnancy complications, including premature birth.  Low birthweight babies.  Early menopause.  Lower estrogen level for women.  Infertility.  Facial wrinkles.  Blindness.  Increased risk of broken bones (fractures).  Senile dementia.  Stillbirths and smaller newborn babies, birth defects, and genetic damage to sperm.  Stomach ulcers and internal bleeding.  Children of smokers have an increased risk of the following, because of secondhand smoke exposure:  Sudden infant death syndrome (SIDS).  Respiratory infections.  Lung cancer.  Heart disease.  Ear  infections.  Smoking causes approximately: 90% of all lung cancer deaths in men.  80% of all lung cancer deaths in women.  90% of deaths from chronic obstructive lung disease.  Compared with nonsmokers, smoking increases the risk of: Coronary heart disease by 2 to 4 times.  Stroke by 2 to 4 times.  Men developing lung cancer by 23 times.  Women developing lung cancer by 13 times.  Dying from chronic obstructive lung diseases by 12 times.  Someone who smokes 2 packs a day loses about 8 years of his or her life. Even smoking lightly shortens your life expectancy by several years. You can greatly reduce the risk of medical problems for you and your family by stopping now. Smoking is the most preventable cause of death and disease in our society. Within days of quitting smoking, your circulation returns to normal, you decrease the risk of having a heart attack, and your lung capacity improves. There may be some increased phlegm in the first few days after quitting, and it may take months for your lungs to clear up completely. Quitting for 10 years cuts your lung cancer risk to almost that of a nonsmoker. WHY IS SMOKING ADDICTIVE? Nicotine is the chemical agent in tobacco that is capable of causing addiction or dependence.  When you smoke and inhale, nicotine is absorbed rapidly into the bloodstream through your lungs. Nicotine absorbed through the lungs is capable of creating a powerful addiction. Both inhaled and non-inhaled nicotine may be addictive.  Addiction studies of cigarettes and spit tobacco show that addiction to nicotine occurs mainly during the teen years, when young people begin using tobacco products.  WHAT ARE THE BENEFITS OF QUITTING?  There are many health benefits to quitting smoking.  Likelihood of developing  cancer and heart disease decreases. Health improvements are seen almost immediately.  Blood pressure, pulse rate, and breathing patterns start returning to normal soon after  quitting.  People who quit may see an improvement in their overall quality of life.  Some people choose to quit all at once. Other options include nicotine replacement products, such as patches, gum, and nasal sprays. Do not use these products without first checking with your caregiver. QUITTING SMOKING It is not easy to quit smoking. Nicotine is addicting, and longtime habits are hard to change. To start, you can write down all your reasons for quitting, tell your family and friends you want to quit, and ask for their help. Throw your cigarettes away, chew gum or cinnamon sticks, keep your hands busy, and drink extra water or juice. Go for walks and practice deep breathing to relax. Think of all the money you are saving: around $1,000 a year, for the average pack-a-day smoker. Nicotine patches and gum have been shown to improve success at efforts to stop smoking. Zyban (bupropion) is an anti-depressant drug that can be prescribed to reduce nicotine withdrawal symptoms and to suppress the urge to smoke. Smoking is an addiction with both physical and psychological effects. Joining a stop-smoking support group can help you cope with the emotional issues. For more information and advice on programs to stop smoking, call your doctor, your local hospital, or these organizations: American Lung Association - 1-800-LUNGUSA   Smoking Cessation  This document explains the best ways for you to quit smoking and new treatments to help. It lists new medicines that can double or triple your chances of quitting and quitting for good. It also considers ways to avoid relapses and concerns you may have about quitting, including weight gain. NICOTINE: A POWERFUL ADDICTION If you have tried to quit smoking, you know how hard it can be. It is hard because nicotine is a very addictive drug. For some people, it can be as addictive as heroin or cocaine. Usually, people make 2 or 3 tries, or more, before finally being able to  quit. Each time you try to quit, you can learn about what helps and what hurts. Quitting takes hard work and a lot of effort, but you can quit smoking. QUITTING SMOKING IS ONE OF THE MOST IMPORTANT THINGS YOU WILL EVER DO.  You will live longer, feel better, and live better.   The impact on your body of quitting smoking is felt almost immediately:   Within 20 minutes, blood pressure decreases. Pulse returns to its normal level.   After 8 hours, carbon monoxide levels in the blood return to normal. Oxygen level increases.   After 24 hours, chance of heart attack starts to decrease. Breath, hair, and body stop smelling like smoke.   After 48 hours, damaged nerve endings begin to recover. Sense of taste and smell improve.   After 72 hours, the body is virtually free of nicotine. Bronchial tubes relax and breathing becomes easier.   After 2 to 12 weeks, lungs can hold more air. Exercise becomes easier and circulation improves.   Quitting will reduce your risk of having a heart attack, stroke, cancer, or lung disease:   After 1 year, the risk of coronary heart disease is cut in half.   After 5 years, the risk of stroke falls to the same as a nonsmoker.   After 10 years, the risk of lung cancer is cut in half and the risk of other cancers decreases significantly.  After 15 years, the risk of coronary heart disease drops, usually to the level of a nonsmoker.   If you are pregnant, quitting smoking will improve your chances of having a healthy baby.   The people you live with, especially your children, will be healthier.   You will have extra money to spend on things other than cigarettes.  FIVE KEYS TO QUITTING Studies have shown that these 5 steps will help you quit smoking and quit for good. You have the best chances of quitting if you use them together: 1. Get ready.  2. Get support and encouragement.  3. Learn new skills and behaviors.  4. Get medicine to reduce your nicotine  addiction and use it correctly.  5. Be prepared for relapse or difficult situations. Be determined to continue trying to quit, even if you do not succeed at first.  1. GET READY  Set a quit date.   Change your environment.   Get rid of ALL cigarettes, ashtrays, matches, and lighters in your home, car, and place of work.   Do not let people smoke in your home.   Review your past attempts to quit. Think about what worked and what did not.   Once you quit, do not smoke. NOT EVEN A PUFF!  2. GET SUPPORT AND ENCOURAGEMENT Studies have shown that you have a better chance of being successful if you have help. You can get support in many ways.  Tell your family, friends, and coworkers that you are going to quit and need their support. Ask them not to smoke around you.   Talk to your caregivers (doctor, dentist, nurse, pharmacist, psychologist, and/or smoking counselor).   Get individual, group, or telephone counseling and support. The more counseling you have, the better your chances are of quitting. Programs are available at [employer] and health centers. Call your local health department for information about programs in your area.   Spiritual beliefs and practices may help some smokers quit.   Quit meters are Insurance underwriter that keep track of quit statistics, such as amount of "quit-time," cigarettes not smoked, and money saved.   Many smokers find one or more of the many self-help books available useful in helping them quit and stay off tobacco.  3. LEARN NEW SKILLS AND BEHAVIORS  Try to distract yourself from urges to smoke. Talk to someone, go for a walk, or occupy your time with a task.   When you first try to quit, change your routine. Take a different route to work. Drink tea instead of coffee. Eat breakfast in a different place.   Do something to reduce your stress. Take a hot bath, exercise, or read a book.   Plan something enjoyable to do  every day. Reward yourself for not smoking.   Explore interactive web-based programs that specialize in helping you quit.  4. GET MEDICINE AND USE IT CORRECTLY Medicines can help you stop smoking and decrease the urge to smoke. Combining medicine with the above behavioral methods and support can quadruple your chances of successfully quitting smoking. The [place] Food and Drug Administration (FDA) has approved 7 medicines to help you quit smoking. These medicines fall into 3 categories.  Nicotine replacement therapy (delivers nicotine to your body without the negative effects and risks of smoking):   Nicotine gum: Available over-the-counter.   Nicotine lozenges: Available over-the-counter.   Nicotine inhaler: Available by prescription.   Nicotine nasal spray: Available by prescription.   Nicotine skin  patches (transdermal): Available by prescription and over-the-counter.   Antidepressant medicine (helps people abstain from smoking, but how this works is unknown):   Bupropion sustained-release (SR) tablets: Available by prescription.   Nicotinic receptor partial agonist (simulates the effect of nicotine in your brain):   Varenicline tartrate tablets: Available by prescription.   Ask your caregiver for advice about which medicines to use and how to use them. Carefully read the information on the package.   Everyone who is trying to quit may benefit from using a medicine. If you are pregnant or trying to become pregnant, nursing an infant, you are under age 37, or you smoke fewer than 10 cigarettes per day, talk to your caregiver before taking any nicotine replacement medicines.   You should stop using a nicotine replacement product and call your caregiver if you experience nausea, dizziness, weakness, vomiting, fast or irregular heartbeat, mouth problems with the lozenge or gum, or redness or swelling of the skin around the patch that does not go away.   Do not use any other product  containing nicotine while using a nicotine replacement product.   Talk to your caregiver before using these products if you have diabetes, heart disease, asthma, stomach ulcers, you had a recent heart attack, you have high blood pressure that is not controlled with medicine, a history of irregular heartbeat, or you have been prescribed medicine to help you quit smoking.  5. BE PREPARED FOR RELAPSE OR DIFFICULT SITUATIONS  Most relapses occur within the first 3 months after quitting. Do not be discouraged if you start smoking again. Remember, most people try several times before they finally quit.   You may have symptoms of withdrawal because your body is used to nicotine. You may crave cigarettes, be irritable, feel very hungry, cough often, get headaches, or have difficulty concentrating.   The withdrawal symptoms are only temporary. They are strongest when you first quit, but they will go away within 10 to 14 days.  Here are some difficult situations to watch for:  Alcohol. Avoid drinking alcohol. Drinking lowers your chances of successfully quitting.   Caffeine. Try to reduce the amount of caffeine you consume. It also lowers your chances of successfully quitting.   Other smokers. Being around smoking can make you want to smoke. Avoid smokers.   Weight gain. Many smokers will gain weight when they quit, usually less than 10 pounds. Eat a healthy diet and stay active. Do not let weight gain distract you from your main goal, quitting smoking. Some medicines that help you quit smoking may also help delay weight gain. You can always lose the weight gained after you quit.   Bad mood or depression. There are a lot of ways to improve your mood other than smoking.  If you are having problems with any of these situations, talk to your caregiver. SPECIAL SITUATIONS AND CONDITIONS Studies suggest that everyone can quit smoking. Your situation or condition can give you a special reason to  quit.  Pregnant women/new mothers: By quitting, you protect your baby's health and your own.   Hospitalized patients: By quitting, you reduce health problems and help healing.   Heart attack patients: By quitting, you reduce your risk of a second heart attack.   Lung, head, and neck cancer patients: By quitting, you reduce your chance of a second cancer.   Parents of children and adolescents: By quitting, you protect your children from illnesses caused by secondhand smoke.  QUESTIONS TO THINK ABOUT Think about  the following questions before you try to stop smoking. You may want to talk about your answers with your caregiver.  Why do you want to quit?   If you tried to quit in the past, what helped and what did not?   What will be the most difficult situations for you after you quit? How will you plan to handle them?   Who can help you through the tough times? Your family? Friends? Caregiver?   What pleasures do you get from smoking? What ways can you still get pleasure if you quit?  Here are some questions to ask your caregiver:  How can you help me to be successful at quitting?   What medicine do you think would be best for me and how should I take it?   What should I do if I need more help?   What is smoking withdrawal like? How can I get information on withdrawal?  Quitting takes hard work and a lot of effort, but you can quit smoking.  You may obtain a copy of any labs that were done today by logging onto MyChart as outlined in the instructions provided with your AVS (after visit summary). The office will not call with normal lab results but certainly if there are any significant abnormalities then we will contact you.   Health Maintenance, Female A healthy lifestyle and preventative care can promote health and wellness.  Maintain regular health, dental, and eye exams.  Eat a healthy diet. Foods like vegetables, fruits, whole grains, low-fat dairy products, and lean  protein foods contain the nutrients you need without too many calories. Decrease your intake of foods high in solid fats, added sugars, and salt. Get information about a proper diet from your caregiver, if necessary.  Regular physical exercise is one of the most important things you can do for your health. Most adults should get at least 150 minutes of moderate-intensity exercise (any activity that increases your heart rate and causes you to sweat) each week. In addition, most adults need muscle-strengthening exercises on 2 or more days a week.   Maintain a healthy weight. The body mass index (BMI) is a screening tool to identify possible weight problems. It provides an estimate of body fat based on height and weight. Your caregiver can help determine your BMI, and can help you achieve or maintain a healthy weight. For adults 20 years and older:  A BMI below 18.5 is considered underweight.  A BMI of 18.5 to 24.9 is normal.  A BMI of 25 to 29.9 is considered overweight.  A BMI of 30 and above is considered obese.  Maintain normal blood lipids and cholesterol by exercising and minimizing your intake of saturated fat. Eat a balanced diet with plenty of fruits and vegetables. Blood tests for lipids and cholesterol should begin at age 53 and be repeated every 5 years. If your lipid or cholesterol levels are high, you are over 50, or you are a high risk for heart disease, you may need your cholesterol levels checked more frequently.Ongoing high lipid and cholesterol levels should be treated with medicines if diet and exercise are not effective.  If you smoke, find out from your caregiver how to quit. If you do not use tobacco, do not start.  Lung cancer screening is recommended for adults aged 45 80 years who are at high risk for developing lung cancer because of a history of smoking. Yearly low-dose computed tomography (CT) is recommended for people who have at  least a 30-pack-year history of smoking  and are a current smoker or have quit within the past 15 years. A pack year of smoking is smoking an average of 1 pack of cigarettes a day for 1 year (for example: 1 pack a day for 30 years or 2 packs a day for 15 years). Yearly screening should continue until the smoker has stopped smoking for at least 15 years. Yearly screening should also be stopped for people who develop a health problem that would prevent them from having lung cancer treatment.  If you are pregnant, do not drink alcohol. If you are breastfeeding, be very cautious about drinking alcohol. If you are not pregnant and choose to drink alcohol, do not exceed 1 drink per day. One drink is considered to be 12 ounces (355 mL) of beer, 5 ounces (148 mL) of wine, or 1.5 ounces (44 mL) of liquor.  Avoid use of street drugs. Do not share needles with anyone. Ask for help if you need support or instructions about stopping the use of drugs.  High blood pressure causes heart disease and increases the risk of stroke. Blood pressure should be checked at least every 1 to 2 years. Ongoing high blood pressure should be treated with medicines, if weight loss and exercise are not effective.  If you are 67 to 65 years old, ask your caregiver if you should take aspirin to prevent strokes.  Diabetes screening involves taking a blood sample to check your fasting blood sugar level. This should be done once every 3 years, after age 67, if you are within normal weight and without risk factors for diabetes. Testing should be considered at a younger age or be carried out more frequently if you are overweight and have at least 1 risk factor for diabetes.  Breast cancer screening is essential preventative care for women. You should practice "breast self-awareness." This means understanding the normal appearance and feel of your breasts and may include breast self-examination. Any changes detected, no matter how small, should be reported to a caregiver. Women in  their 66s and 30s should have a clinical breast exam (CBE) by a caregiver as part of a regular health exam every 1 to 3 years. After age 24, women should have a CBE every year. Starting at age 61, women should consider having a mammogram (breast X-ray) every year. Women who have a family history of breast cancer should talk to their caregiver about genetic screening. Women at a high risk of breast cancer should talk to their caregiver about having an MRI and a mammogram every year.  Breast cancer gene (BRCA)-related cancer risk assessment is recommended for women who have family members with BRCA-related cancers. BRCA-related cancers include breast, ovarian, tubal, and peritoneal cancers. Having family members with these cancers may be associated with an increased risk for harmful changes (mutations) in the breast cancer genes BRCA1 and BRCA2. Results of the assessment will determine the need for genetic counseling and BRCA1 and BRCA2 testing.  The Pap test is a screening test for cervical cancer. Women should have a Pap test starting at age 46. Between ages 52 and 67, Pap tests should be repeated every 2 years. Beginning at age 36, you should have a Pap test every 3 years as long as the past 3 Pap tests have been normal. If you had a hysterectomy for a problem that was not cancer or a condition that could lead to cancer, then you no longer need Pap tests. If you  are between ages 16 and 66, and you have had normal Pap tests going back 10 years, you no longer need Pap tests. If you have had past treatment for cervical cancer or a condition that could lead to cancer, you need Pap tests and screening for cancer for at least 20 years after your treatment. If Pap tests have been discontinued, risk factors (such as a new sexual partner) need to be reassessed to determine if screening should be resumed. Some women have medical problems that increase the chance of getting cervical cancer. In these cases, your caregiver  may recommend more frequent screening and Pap tests.  The human papillomavirus (HPV) test is an additional test that may be used for cervical cancer screening. The HPV test looks for the virus that can cause the cell changes on the cervix. The cells collected during the Pap test can be tested for HPV. The HPV test could be used to screen women aged 46 years and older, and should be used in women of any age who have unclear Pap test results. After the age of 10, women should have HPV testing at the same frequency as a Pap test.  Colorectal cancer can be detected and often prevented. Most routine colorectal cancer screening begins at the age of 70 and continues through age 55. However, your caregiver may recommend screening at an earlier age if you have risk factors for colon cancer. On a yearly basis, your caregiver may provide home test kits to check for hidden blood in the stool. Use of a small camera at the end of a tube, to directly examine the colon (sigmoidoscopy or colonoscopy), can detect the earliest forms of colorectal cancer. Talk to your caregiver about this at age 19, when routine screening begins. Direct examination of the colon should be repeated every 5 to 10 years through age 62, unless early forms of pre-cancerous polyps or small growths are found.  Hepatitis C blood testing is recommended for all people born from [birth year] through 1967 and any individual with known risks for hepatitis C.  Practice safe sex. Use condoms and avoid high-risk sexual practices to reduce the spread of sexually transmitted infections (STIs). Sexually active women aged 26 and younger should be checked for Chlamydia, which is a common sexually transmitted infection. Older women with new or multiple partners should also be tested for Chlamydia. Testing for other STIs is recommended if you are sexually active and at increased risk.  Osteoporosis is a disease in which the bones lose minerals and strength with aging. This  can result in serious bone fractures. The risk of osteoporosis can be identified using a bone density scan. Women ages 12 and over and women at risk for fractures or osteoporosis should discuss screening with their caregivers. Ask your caregiver whether you should be taking a calcium supplement or vitamin D to reduce the rate of osteoporosis.  Menopause can be associated with physical symptoms and risks. Hormone replacement therapy is available to decrease symptoms and risks. You should talk to your caregiver about whether hormone replacement therapy is right for you.  Use sunscreen. Apply sunscreen liberally and repeatedly throughout the day. You should seek shade when your shadow is shorter than you. Protect yourself by wearing long sleeves, pants, a wide-brimmed hat, and sunglasses year round, whenever you are outdoors.  Notify your caregiver of new moles or changes in moles, especially if there is a change in shape or color. Also notify your caregiver if a mole is larger  than the size of a pencil eraser.  Stay current with your immunizations. Document Released: 08/29/2010 Document Revised: 06/10/2012 Document Reviewed: 08/29/2010 [facility] Patient Information 2014 [place].

## 2013-10-16 NOTE — Progress Notes (Signed)
Treasa SchoolSusan Busch 03/01/1950 [id number]        63 y.o.  [id number] for followup exam. Several issues and below.  Past medical history,surgical history, problem list, medications, allergies, family history and social history were all reviewed and documented as reviewed in the EPIC chart.  ROS:  12 system ROS performed with pertinent positives and negatives included in the history, assessment and plan.   Additional significant findings :  None   Exam: [facility] personassistant Filed Vitals:   10/16/13 0830  BP: 112/70  Height: 5\' 3"  (1.6 m)  Weight: 199 lb (90.266 kg)   General appearance:  Normal affect, orientation and appearance. Skin: Grossly normal HEENT: Without gross lesions.  No cervical or supraclavicular adenopathy. Thyroid normal.  Lungs:  Clear without wheezing, rales or rhonchi Cardiac: RR, without RMG Abdominal:  Soft, nontender, without masses, guarding, rebound, organomegaly or hernia Breasts:  Examined lying and sitting without masses, retractions, discharge or axillary adenopathy. Pelvic:  Ext/BUS/vagina with generalized atrophic changes. No lesions noted.  Adnexa  Without masses or tenderness    Anus and perineum  Normal   Rectovaginal  Normal sphincter tone without palpated masses or tenderness.    Assessment/Plan:  63 y.o. [id number] female for followup exam.   1. Postmenopausal/atrophic genital changes. Patient without significant symptoms such as hot flushes, night sweats, vaginal dryness. Is not sexually active. Will continue to monitor. 2. History of pigmented vulvar lesions. 2 seborrheic keratoses excised last year. Exam today is normal without evidence of any lesions. We'll continue with monitoring with annual exams. 3. Mammography 03/2013. Patient had listed her sister had breast cancer in her 10440s. She now has talked to her and to her sister's friends and feels that it was not cancer again noting the patient received no postoperative treatments and the patient's sister is very  evasive when questioned. We have discussed genetic testing previously and the patient actually asked one of her sister's friends who is a physician whether she thought she needed genetic testing and the physician based on what she knew from her sister said no. Will continue with annual mammography. SBE monthly reviewed. 4. DEXA 2012 normal. Repeat at five-year interval. 5. Pap smear 2012. Pap of vaginal cuff done today. Options to stop screening altogether reviewed. Patient is [age] and status post hysterectomy for benign indications. Will readdress on an annual basis. 6. Colonoscopy 2010. Recommended repeat interval is 10 years per Dr. Juanda ChanceBrodie. 7. Stop smoking. Patient has been counseled multiple times and chooses to continue smoking. 8. Health maintenance. No routine blood work done as this is done at her primary physician's office. Followup one year, sooner as needed.   Note: This document was prepared with digital dictation and possible smart phrase technology. Any transcriptional errors that result from this process are unintentional.   Dara LordsFONTAINE,Fredrico Beedle P MD, 9:24 AM 10/16/2013

## 2013-10-17 LAB — CYTOLOGY - PAP

## 2013-11-07 ENCOUNTER — Ambulatory Visit: Payer: BC Managed Care – PPO

## 2013-11-07 DIAGNOSIS — Z0000 Encounter for general adult medical examination without abnormal findings: Secondary | ICD-10-CM

## 2013-11-07 LAB — MICROALBUMIN / CREATININE URINE RATIO
Creatinine,U: 94.3 mg/dL
MICROALB UR: 0.4 mg/dL (ref 0.0–1.9)
Microalb Creat Ratio: 0.4 mg/g (ref 0.0–30.0)

## 2013-11-07 LAB — HEPATIC FUNCTION PANEL
ALT: 27 U/L (ref 0–35)
AST: 21 U/L (ref 0–37)
Albumin: 3.9 g/dL (ref 3.5–5.2)
Alkaline Phosphatase: 80 U/L (ref 39–117)
Bilirubin, Direct: 0.1 mg/dL (ref 0.0–0.3)
Total Bilirubin: 0.5 mg/dL (ref 0.2–1.2)
Total Protein: 7 g/dL (ref 6.0–8.3)

## 2013-11-07 LAB — BASIC METABOLIC PANEL
BUN: 20 mg/dL (ref 6–23)
CO2: 27 mEq/L (ref 19–32)
CREATININE: 0.6 mg/dL (ref 0.4–1.2)
Calcium: 9 mg/dL (ref 8.4–10.5)
Chloride: 105 mEq/L (ref 96–112)
GFR: 100.64 mL/min (ref >60.00)
GLUCOSE: 118 mg/dL — AB (ref 70–99)
Potassium: 4.2 mEq/L (ref 3.5–5.1)
Sodium: 141 mEq/L (ref 135–145)

## 2013-11-07 LAB — LIPID PANEL
CHOLESTEROL: 180 mg/dL (ref 0–200)
HDL: 31 mg/dL — AB (ref >39.00)
NonHDL: 149
Total CHOL/HDL Ratio: 6
Triglycerides: 271 mg/dL — ABNORMAL HIGH (ref 0.0–149.0)
VLDL: 54.2 mg/dL — AB (ref 0.0–40.0)

## 2013-11-07 LAB — LDL CHOLESTEROL, DIRECT: LDL DIRECT: 110.3 mg/dL

## 2013-11-07 LAB — TSH: TSH: 2.15 u[IU]/mL (ref 0.35–4.50)

## 2013-11-07 LAB — HEMOGLOBIN A1C: HEMOGLOBIN A1C: 6.5 % (ref 4.6–6.5)

## 2013-11-10 ENCOUNTER — Other Ambulatory Visit: Payer: Self-pay | Admitting: Internal Medicine

## 2013-11-12 ENCOUNTER — Ambulatory Visit (INDEPENDENT_AMBULATORY_CARE_PROVIDER_SITE_OTHER)
Admission: RE | Admit: 2013-11-12 | Discharge: 2013-11-12 | Disposition: A | Discharge status: Home / Self Care | Payer: BC Managed Care – PPO | Source: Ambulatory Visit | Attending: Internal Medicine | Admitting: Internal Medicine

## 2013-11-12 ENCOUNTER — Ambulatory Visit (INDEPENDENT_AMBULATORY_CARE_PROVIDER_SITE_OTHER): Payer: BC Managed Care – PPO | Admitting: Internal Medicine

## 2013-11-12 ENCOUNTER — Encounter: Payer: Self-pay | Admitting: Internal Medicine

## 2013-11-12 VITALS — BP 118/72 | HR 91 | Temp 98.4°F | Resp 14 | Ht 63.5 in | Wt 202.1 lb

## 2013-11-12 DIAGNOSIS — R0789 Other chest pain: Secondary | ICD-10-CM

## 2013-11-12 DIAGNOSIS — Z23 Encounter for immunization: Secondary | ICD-10-CM

## 2013-11-12 DIAGNOSIS — E781 Pure hyperglyceridemia: Secondary | ICD-10-CM

## 2013-11-12 DIAGNOSIS — Z8619 Personal history of other infectious and parasitic diseases: Secondary | ICD-10-CM

## 2013-11-12 DIAGNOSIS — F17200 Nicotine dependence, unspecified, uncomplicated: Secondary | ICD-10-CM

## 2013-11-12 MED ORDER — RANITIDINE HCL 150 MG PO TABS: 150.0000 mg | ORAL_TABLET | Freq: Two times a day (BID) | ORAL | Status: DC

## 2013-11-12 NOTE — Assessment & Plan Note (Signed)
CBC

## 2013-11-12 NOTE — Assessment & Plan Note (Signed)
Risks & options discussed She has E cigarette

## 2013-11-12 NOTE — Progress Notes (Signed)
   Subjective:    Patient ID: Alexa Buchanan, female    DOB: 30-Sep-1950, 63 y.o.   MRN: [id number]  HPI She is here to assess active health issues & conditions. PMH, FH, & Social history verified & updated     Review of Systems   Over the past 8 months she has experienced intermittent sharp posterior thoracic pain at rest. It starts inferiorly and radiates bilaterally up to the shoulder level.  Drinking milk or excess amounts of water do seem to help. It can be as severe as a level IX. It is not associated with GI symptoms  She does have occasional palpitations.  Unexplained weight loss, abdominal pain, significant dyspepsia, dysphagia, melena, rectal bleeding, or persistently small caliber stools are denied.    Anterior or exertional chest pain,  tachycardia, exertional dyspnea, paroxysmal nocturnal dyspnea, claudication or edema are absent.       Objective:   Physical Exam     Pertinent positive findings include:  As per CDC Guidelines ,Epic documents obesity as being present . She has decreased hearing bilaterally; she has hearing aids but is not wearing them. The oropharynx is crowded. Accentuated thoracic curvature. Deep tendon reflexes are 0+ at the knees.  General appearance :adequately nourished; in no distress. Eyes: No conjunctival inflammation or scleral icterus is present. Oral exam: Dental hygiene is good. Lips and gums are healthy appearing.There is no oropharyngeal erythema or exudate noted.  Heart:  Normal rate and regular rhythm. S1 and S2 normal without gallop, murmur, click, rub or other extra sounds   Lungs:Chest clear to auscultation; no wheezes, rhonchi,rales ,or rubs present.No increased work of breathing.  Abdomen: bowel sounds normal, soft and non-tender without masses, organomegaly or hernias noted.  No guarding or rebound. No flank tenderness to percussion. Skin:Warm & dry.  Intact without suspicious lesions or rashes ; no jaundice or  tenting Lymphatic: No lymphadenopathy is noted about the head, neck, axilla              Assessment & Plan:  #1See Current Assessment & Plan in Problem List under specific Diagnosis #2 atypical chest pain; ? GERD with esophageal spasm See orders

## 2013-11-12 NOTE — Assessment & Plan Note (Signed)
TLC recommended rather than fenofibrate

## 2013-11-12 NOTE — Progress Notes (Signed)
Pre visit review using our clinic review tool, if applicable. No additional management support is needed unless otherwise documented below in the visit note. 

## 2013-11-12 NOTE — Patient Instructions (Signed)
Your next office appointment will be determined based upon review of your pending  x-rays. Those instructions will be transmitted to you through My Chart   Reflux of gastric acid may be asymptomatic as this may occur mainly during sleep.The triggers for reflux  include stress; the "aspirin family" ; alcohol; peppermint; and caffeine (coffee, tea, cola, and chocolate). The aspirin family would include aspirin and the nonsteroidal agents such as ibuprofen &  Naproxen. Tylenol would not cause reflux. If having symptoms ; food & drink should be avoided for @ least 2 hours before going to bed.

## 2013-11-12 NOTE — Assessment & Plan Note (Signed)
A1c well controlled

## 2013-12-08 ENCOUNTER — Other Ambulatory Visit: Payer: Self-pay | Admitting: Internal Medicine

## 2013-12-26 ENCOUNTER — Ambulatory Visit (INDEPENDENT_AMBULATORY_CARE_PROVIDER_SITE_OTHER): Payer: BLUE CROSS/BLUE SHIELD | Admitting: Internal Medicine

## 2013-12-26 DIAGNOSIS — Z23 Encounter for immunization: Secondary | ICD-10-CM | POA: Diagnosis not present

## 2013-12-26 MED ORDER — PNEUMOCOCCAL VAC POLYVALENT 25 MCG/0.5ML IJ INJ
0.5000 mL | INJECTION | INTRAMUSCULAR | Status: DC
Start: 2013-12-27 — End: 2022-01-12

## 2013-12-29 ENCOUNTER — Encounter: Payer: Self-pay | Admitting: Internal Medicine

## 2014-01-05 ENCOUNTER — Other Ambulatory Visit: Payer: Self-pay | Admitting: Internal Medicine

## 2014-02-10 ENCOUNTER — Encounter: Payer: Self-pay | Admitting: Family

## 2014-02-10 ENCOUNTER — Ambulatory Visit (INDEPENDENT_AMBULATORY_CARE_PROVIDER_SITE_OTHER): Payer: BC Managed Care – PPO | Admitting: Family

## 2014-02-10 VITALS — BP 138/74 | HR 80 | Temp 98.2°F | Resp 18 | Ht 64.5 in | Wt 199.0 lb

## 2014-02-10 DIAGNOSIS — J209 Acute bronchitis, unspecified: Secondary | ICD-10-CM

## 2014-02-10 MED ORDER — LANCET DEVICE MISC: Status: DC

## 2014-02-10 MED ORDER — GLUCOSE BLOOD VI STRP: ORAL_STRIP | Status: DC

## 2014-02-10 MED ORDER — HYDROCODONE-HOMATROPINE 5-1.5 MG/5ML PO SYRP: 5.0000 mL | ORAL_SOLUTION | Freq: Three times a day (TID) | ORAL | Status: DC | PRN

## 2014-02-10 MED ORDER — AZITHROMYCIN 250 MG PO TABS: ORAL_TABLET | ORAL | Status: DC

## 2014-02-10 NOTE — Patient Instructions (Signed)
Thank you for choosing [facility].  Summary/Instructions:  Your prescription(s) have been submitted to your pharmacy. Please take as directed and contact our office if you believe you are having problem(s) with the medication(s).  If your symptoms worsen or fail to improve, please contact our office for further instruction, or in case of emergency go directly to the emergency room at the closest medical facility.    Acute Bronchitis Bronchitis is inflammation of the airways that extend from the windpipe into the lungs (bronchi). The inflammation often causes mucus to develop. This leads to a cough, which is the most common symptom of bronchitis.  In acute bronchitis, the condition usually develops suddenly and goes away over time, usually in a couple weeks. Smoking, allergies, and asthma can make bronchitis worse. Repeated episodes of bronchitis may cause further lung problems.  CAUSES Acute bronchitis is most often caused by the same virus that causes a cold. The virus can spread from person to person (contagious) through coughing, sneezing, and touching contaminated objects. SIGNS AND SYMPTOMS   Cough.   Fever.   Coughing up mucus.   Body aches.   Chest congestion.   Chills.   Shortness of breath.   Sore throat.  DIAGNOSIS  Acute bronchitis is usually diagnosed through a physical exam. Your health care provider will also ask you questions about your medical history. Tests, such as chest X-rays, are sometimes done to rule out other conditions.  TREATMENT  Acute bronchitis usually goes away in a couple weeks. Oftentimes, no medical treatment is necessary. Medicines are sometimes given for relief of fever or cough. Antibiotic medicines are usually not needed but may be prescribed in certain situations. In some cases, an inhaler may be recommended to help reduce shortness of breath and control the cough. A cool mist vaporizer may also be used to help thin bronchial  secretions and make it easier to clear the chest.  HOME CARE INSTRUCTIONS  Get plenty of rest.   Drink enough fluids to keep your urine clear or pale yellow (unless you have a medical condition that requires fluid restriction). Increasing fluids may help thin your respiratory secretions (sputum) and reduce chest congestion, and it will prevent dehydration.   Take medicines only as directed by your health care provider.  If you were prescribed an antibiotic medicine, finish it all even if you start to feel better.  Avoid smoking and secondhand smoke. Exposure to cigarette smoke or irritating chemicals will make bronchitis worse. If you are a smoker, consider using nicotine gum or skin patches to help control withdrawal symptoms. Quitting smoking will help your lungs heal faster.   Reduce the chances of another bout of acute bronchitis by washing your hands frequently, avoiding people with cold symptoms, and trying not to touch your hands to your mouth, nose, or eyes.   Keep all follow-up visits as directed by your health care provider.  SEEK MEDICAL CARE IF: Your symptoms do not improve after 1 week of treatment.  SEEK IMMEDIATE MEDICAL CARE IF:  You develop an increased fever or chills.   You have chest pain.   You have severe shortness of breath.  You have bloody sputum.   You develop dehydration.  You faint or repeatedly feel like you are going to pass out.  You develop repeated vomiting.  You develop a severe headache. MAKE SURE YOU:   Understand these instructions.  Will watch your condition.  Will get help right away if you are not doing well or   get worse. Document Released: 03/23/2004 Document Revised: 06/30/2013 Document Reviewed: 08/06/2012 Exit[employer] Patient Information 2015 [employer]. This information is not intended to replace advice given to you by your health care provider. Make sure you discuss any questions you have with your health care  provider.  

## 2014-02-10 NOTE — Progress Notes (Signed)
Pre visit review using our clinic review tool, if applicable. No additional management support is needed unless otherwise documented below in the visit note. 

## 2014-02-10 NOTE — Progress Notes (Signed)
Subjective:    Patient ID: Alexa Buchanan, female    DOB: 02/13/1951, 63 y.o.   MRN: [id number]  Chief Complaint  Patient presents with  . Cough    productive cough, vomiting, congestion x1 week    HPI:  Alexa Buchanan is a 63 y.o. female who presents today for an acute visit.  Acute symptoms of productive cough, vomiting and congestion have been going on for approximately 1 week. Denies any fevers, chills, or body aches. Is able to keep liquids down and has a hard time keeping solid foods down. Denies any sinus headache or sinus pressure. Has tried tylenol cold and flu.   Allergies  Allergen Reactions  . Amoxicillin-Pot Clavulanate Other (See Comments)    Stomach pain  . Other     Gluten causes IBS    Current Outpatient Prescriptions on File Prior to Visit  Medication Sig Dispense Refill  . Calcium Carbonate-Vitamin D (CALCIUM + D PO) Take by mouth 2 (two) times daily.     . cetirizine (ZYRTEC) 10 MG tablet Take 10 mg by mouth daily.    . Cholecalciferol (VITAMIN D3) 2000 UNITS TABS Take by mouth 2 (two) times daily.      . clonazePAM (KLONOPIN) 1 MG tablet Take 1 tablet (1 mg total) by mouth 2 (two) times daily. ONLY TAKES WHEN FLYING 30 tablet 0  . gabapentin (NEURONTIN) 100 MG capsule 1 TABLET EVERY 8 HOURS AS NEEDED 270 capsule 1  . glucose blood (ONE TOUCH ULTRA TEST) test strip LABS DUE! CHECK BLOOD SUGAR ONCE A DAY AS DIRECTED. 50 each 6  . Insulin Pen Needle (NOVOFINE) 32G X 6 MM MISC Use once daily with Victoza, DX: 250.00 100 each 3  . Lancet Device MISC by Does not apply route. One Touch Delica, test once daily     . metFORMIN (GLUCOPHAGE-XR) 500 MG 24 hr tablet TAKE 1 TABLET ONCE DAILY. 90 tablet 1  . Multiple Vitamin (MULTIVITAMIN) capsule Take 1 capsule by mouth daily. Centrum Silver.    [name]. ONETOUCH DELICA LANCETS 33G MISC CHECK BLOOD SUGAR ONCE A DAY. 100 each 3  . oxybutynin (DITROPAN-XL) 10 MG 24 hr tablet Take 1 tablet by mouth Daily.    Marland Kitchen.  oxyCODONE-acetaminophen (PERCOCET/ROXICET) 5-325 MG per tablet Take 1 tablet by mouth every 4 (four) hours as needed for pain. 20 tablet 0  . ranitidine (ZANTAC) 150 MG tablet Take 1 tablet (150 mg total) by mouth 2 (two) times daily. 60 tablet 2  . UNABLE TO FIND Take 500 mg by mouth 4 (four) times daily. Penicillin prescribed by dentist until gone    . VICTOZA 18 MG/3ML SOPN INJECT 1.8MG  SUBCUTANEOUSLY EACH DAY. 9 mL 3  . zolpidem (AMBIEN) 10 MG tablet Take 10 mg by mouth at bedtime as needed.      Current Facility-Administered Medications on File Prior to Visit  Medication Dose Route Frequency Provider Last Rate Last Dose  . pneumococcal 23 valent vaccine (PNU-IMMUNE) injection 0.5 mL  0.5 mL Intramuscular Tomorrow-1000 Pecola LawlessWilliam F Hopper, MD       Review of Systems    See HPI  Objective:    BP 138/74 mmHg  Pulse 80  Temp(Src) 98.2 F (36.8 C) (Oral)  Resp 18  Ht 5' 4.5" (1.638 m)  Wt 199 lb (90.266 kg)  BMI 33.64 kg/m2  SpO2 98%  LMP 02/28/1995 Nursing note and vital signs reviewed.  Physical Exam  Constitutional: She is oriented to person, place, and time. She appears well-developed  and well-nourished. No distress.  HENT:  Right Ear: Hearing, tympanic membrane, external ear and ear canal normal.  Left Ear: Hearing, tympanic membrane, external ear and ear canal normal.  Nose: Nose normal. Right sinus exhibits no maxillary sinus tenderness and no frontal sinus tenderness. Left sinus exhibits no maxillary sinus tenderness and no frontal sinus tenderness.  Mouth/Throat: Uvula is midline, oropharynx is clear and moist and mucous membranes are normal.  Neck: Neck supple.  Cardiovascular: Normal rate, regular rhythm and normal heart sounds.   Pulmonary/Chest: Effort normal and breath sounds normal.  Lymphadenopathy:    She has no cervical adenopathy.  Neurological: She is alert and oriented to person, place, and time.  Skin: Skin is warm and dry.  Psychiatric: She has a normal  mood and affect. Her behavior is normal. Judgment and thought content normal.       Assessment & Plan:

## 2014-02-10 NOTE — Assessment & Plan Note (Signed)
Symptoms and exam consistent with acute bronchitis. Start azithromycin. Start Hycodan as needed for cough and sleep. Continue over-the-counter medications as needed for symptom relief. Follow up if symptoms worsen or fail to improve.

## 2014-02-27 HISTORY — PX: ROTATOR CUFF REPAIR: SHX139

## 2014-03-10 ENCOUNTER — Other Ambulatory Visit: Payer: Self-pay

## 2014-03-10 DIAGNOSIS — Z1231 Encounter for screening mammogram for malignant neoplasm of breast: Secondary | ICD-10-CM

## 2014-04-13 ENCOUNTER — Ambulatory Visit: Payer: Self-pay

## 2014-04-21 ENCOUNTER — Ambulatory Visit
Admission: RE | Admit: 2014-04-21 | Discharge: 2014-04-21 | Disposition: A | Discharge status: Home / Self Care | Payer: Medicare Other | Source: Ambulatory Visit

## 2014-04-21 ENCOUNTER — Other Ambulatory Visit: Payer: Self-pay

## 2014-04-21 DIAGNOSIS — Z1231 Encounter for screening mammogram for malignant neoplasm of breast: Secondary | ICD-10-CM

## 2014-05-07 ENCOUNTER — Other Ambulatory Visit: Payer: Self-pay

## 2014-05-07 MED ORDER — GLUCOSE BLOOD VI STRP: ORAL_STRIP | Status: AC

## 2014-05-20 NOTE — Progress Notes (Signed)
   Subjective:    Patient ID: Alexa Buchanan, female    DOB: 04/14/1950, 64 y.o.   MRN: [id number]  HPI    Review of Systems     Objective:   Physical Exam        Assessment & Plan:  Given needed immunization

## 2014-05-27 MED ORDER — ZOSTER VACCINE LIVE 19400 UNT/0.65ML ~~LOC~~ SOLR: 0.6500 mL | Freq: Once | SUBCUTANEOUS | Status: DC

## 2014-05-27 NOTE — Addendum Note (Signed)
Addended by: Kelly SplinterHAWKINS, Effa Yarrow B on: 05/27/2014 09:33 AM   Modules accepted: Orders, Medications, Level of Service

## 2014-06-04 ENCOUNTER — Other Ambulatory Visit: Payer: Self-pay | Admitting: Orthopaedic Surgery

## 2014-06-04 DIAGNOSIS — M25511 Pain in right shoulder: Secondary | ICD-10-CM

## 2014-06-06 ENCOUNTER — Other Ambulatory Visit: Payer: Self-pay | Admitting: Internal Medicine

## 2014-06-27 ENCOUNTER — Ambulatory Visit
Admission: RE | Admit: 2014-06-27 | Discharge: 2014-06-27 | Disposition: A | Discharge status: Home / Self Care | Payer: BLUE CROSS/BLUE SHIELD | Source: Ambulatory Visit | Attending: Orthopaedic Surgery | Admitting: Orthopaedic Surgery

## 2014-06-27 ENCOUNTER — Other Ambulatory Visit: Payer: BLUE CROSS/BLUE SHIELD

## 2014-06-27 DIAGNOSIS — M25511 Pain in right shoulder: Secondary | ICD-10-CM

## 2014-07-04 ENCOUNTER — Other Ambulatory Visit: Payer: Self-pay | Admitting: Internal Medicine

## 2014-07-06 ENCOUNTER — Other Ambulatory Visit: Payer: Self-pay | Admitting: Internal Medicine

## 2014-07-13 LAB — HM DIABETES EYE EXAM

## 2014-07-22 ENCOUNTER — Encounter: Payer: Self-pay | Admitting: Internal Medicine

## 2014-08-24 ENCOUNTER — Other Ambulatory Visit: Payer: Self-pay

## 2014-10-28 ENCOUNTER — Encounter: Payer: Self-pay | Admitting: Gynecology

## 2014-11-04 ENCOUNTER — Encounter: Payer: Self-pay | Admitting: Gynecology

## 2014-11-06 ENCOUNTER — Ambulatory Visit (INDEPENDENT_AMBULATORY_CARE_PROVIDER_SITE_OTHER): Payer: Medicare Other | Admitting: Gynecology

## 2014-11-06 ENCOUNTER — Encounter: Payer: Self-pay | Admitting: Gynecology

## 2014-11-06 VITALS — BP 130/80 | Wt 207.0 lb

## 2014-11-06 DIAGNOSIS — N952 Postmenopausal atrophic vaginitis: Secondary | ICD-10-CM

## 2014-11-06 DIAGNOSIS — Z01419 Encounter for gynecological examination (general) (routine) without abnormal findings: Secondary | ICD-10-CM | POA: Diagnosis not present

## 2014-11-06 NOTE — Patient Instructions (Signed)

## 2014-11-06 NOTE — Progress Notes (Signed)
Alexa Buchanan 16-Jan-1951 [id number]        64 y.o.  [id number] for breast and pelvic exam  Past medical history,surgical history, problem list, medications, allergies, family history and social history were all reviewed and documented as reviewed in the EPIC chart.  ROS:  Performed with pertinent positives and negatives included in the history, assessment and plan.   Additional significant findings :  none   Exam: Alexa Buchanan Vitals:   11/06/14 0806  BP: 130/80  Weight: 207 lb (93.895 kg)   General appearance:  Normal affect, orientation and appearance. Skin: Grossly normal HEENT: Without gross lesions.  No cervical or supraclavicular adenopathy. Thyroid normal.  Lungs:  Clear without wheezing, rales or rhonchi Cardiac: RR, without RMG Abdominal:  Soft, nontender, without masses, guarding, rebound, organomegaly or hernia Breasts:  Examined lying and sitting without masses, retractions, discharge or axillary adenopathy. Pelvic:  Ext/BUS/vagina with atrophic changes  Adnexa  Without masses or tenderness    Anus and perineum  Normal   Rectovaginal  Normal sphincter tone without palpated masses or tenderness.    Assessment/Plan:  64 y.o. [id number] female for breast and pelvic exam.   1. Postmenopausal/atrophic genital changes.  Status post TVH BSO sling for uterine prolapse 2003. Doing well without significant hot flushes, night sweats, vaginal dryness. 2. Mammography 03/2014. Continue with annual mammography. SBE monthly reviewed. 3. Pap smear 2015. No Pap smear done today. Options to stop screening issues over the age of 27 and status post hysterectomy for benign indications versus less frequent screening intervals reviewed. Will readdress on anal basis. 4. DEXA 2012 normal. Plan repeat next year at five-year interval.  Increased calcium vitamin D. 5. Colonoscopy 2010. Recommended repeat interval 10 years per Dr. Jone Buchanan. 6. Health maintenance. No routine blood work done  this is all done through her primary physician's office. Follow up 1 year, sooner as needed.   Alexa Lords MD, 8:32 AM 11/06/2014

## 2014-12-22 ENCOUNTER — Other Ambulatory Visit: Payer: Self-pay | Admitting: Internal Medicine

## 2014-12-30 ENCOUNTER — Other Ambulatory Visit: Payer: Self-pay | Admitting: Internal Medicine

## 2014-12-30 NOTE — Telephone Encounter (Signed)
#  60 last seen 9/15 needs OV for any more refills

## 2014-12-30 NOTE — Telephone Encounter (Signed)
Do not see med on current list. Please advise. Pt last seen 2015

## 2015-02-16 ENCOUNTER — Other Ambulatory Visit: Payer: Self-pay | Admitting: Internal Medicine

## 2015-02-16 DIAGNOSIS — R918 Other nonspecific abnormal finding of lung field: Secondary | ICD-10-CM

## 2015-02-25 ENCOUNTER — Ambulatory Visit
Admission: RE | Admit: 2015-02-25 | Discharge: 2015-02-25 | Disposition: A | Discharge status: Home / Self Care | Payer: Medicare Other | Source: Ambulatory Visit | Attending: Internal Medicine | Admitting: Internal Medicine

## 2015-02-25 DIAGNOSIS — R918 Other nonspecific abnormal finding of lung field: Secondary | ICD-10-CM

## 2015-04-07 DIAGNOSIS — Z1212 Encounter for screening for malignant neoplasm of rectum: Secondary | ICD-10-CM | POA: Diagnosis not present

## 2015-04-18 ENCOUNTER — Other Ambulatory Visit: Payer: Self-pay | Admitting: Internal Medicine

## 2015-04-19 NOTE — Telephone Encounter (Signed)
Left message advising patient to call back to establish care with new pcp---let tamara know when patient calls back so that zantac rx request can be sent to pharm

## 2015-04-27 DIAGNOSIS — L821 Other seborrheic keratosis: Secondary | ICD-10-CM | POA: Diagnosis not present

## 2015-04-27 DIAGNOSIS — L649 Androgenic alopecia, unspecified: Secondary | ICD-10-CM | POA: Diagnosis not present

## 2015-04-27 DIAGNOSIS — D485 Neoplasm of uncertain behavior of skin: Secondary | ICD-10-CM | POA: Diagnosis not present

## 2015-04-27 DIAGNOSIS — L700 Acne vulgaris: Secondary | ICD-10-CM | POA: Diagnosis not present

## 2015-04-27 DIAGNOSIS — D2272 Melanocytic nevi of left lower limb, including hip: Secondary | ICD-10-CM | POA: Diagnosis not present

## 2015-04-27 DIAGNOSIS — D1801 Hemangioma of skin and subcutaneous tissue: Secondary | ICD-10-CM | POA: Diagnosis not present

## 2015-04-27 DIAGNOSIS — D2271 Melanocytic nevi of right lower limb, including hip: Secondary | ICD-10-CM | POA: Diagnosis not present

## 2015-04-27 DIAGNOSIS — L918 Other hypertrophic disorders of the skin: Secondary | ICD-10-CM | POA: Diagnosis not present

## 2015-05-11 ENCOUNTER — Other Ambulatory Visit: Payer: Self-pay | Admitting: Family

## 2015-05-14 ENCOUNTER — Other Ambulatory Visit: Payer: Self-pay

## 2015-05-14 DIAGNOSIS — Z1231 Encounter for screening mammogram for malignant neoplasm of breast: Secondary | ICD-10-CM

## 2015-06-08 ENCOUNTER — Ambulatory Visit
Admission: RE | Admit: 2015-06-08 | Discharge: 2015-06-08 | Disposition: A | Discharge status: Home / Self Care | Payer: Medicare Other | Source: Ambulatory Visit

## 2015-06-08 DIAGNOSIS — Z1231 Encounter for screening mammogram for malignant neoplasm of breast: Secondary | ICD-10-CM

## 2015-06-10 ENCOUNTER — Other Ambulatory Visit: Payer: Self-pay | Admitting: Gynecology

## 2015-06-10 DIAGNOSIS — R928 Other abnormal and inconclusive findings on diagnostic imaging of breast: Secondary | ICD-10-CM

## 2015-06-16 ENCOUNTER — Ambulatory Visit
Admission: RE | Admit: 2015-06-16 | Discharge: 2015-06-16 | Disposition: A | Discharge status: Home / Self Care | Payer: Medicare Other | Source: Ambulatory Visit | Attending: Gynecology | Admitting: Gynecology

## 2015-06-16 ENCOUNTER — Other Ambulatory Visit: Payer: Self-pay | Admitting: Gynecology

## 2015-06-16 DIAGNOSIS — R928 Other abnormal and inconclusive findings on diagnostic imaging of breast: Secondary | ICD-10-CM

## 2015-06-16 DIAGNOSIS — R921 Mammographic calcification found on diagnostic imaging of breast: Secondary | ICD-10-CM | POA: Diagnosis not present

## 2015-06-21 DIAGNOSIS — E1140 Type 2 diabetes mellitus with diabetic neuropathy, unspecified: Secondary | ICD-10-CM | POA: Diagnosis not present

## 2015-06-21 DIAGNOSIS — Z6833 Body mass index (BMI) 33.0-33.9, adult: Secondary | ICD-10-CM | POA: Diagnosis not present

## 2015-06-21 DIAGNOSIS — R928 Other abnormal and inconclusive findings on diagnostic imaging of breast: Secondary | ICD-10-CM | POA: Diagnosis not present

## 2015-06-21 DIAGNOSIS — N3281 Overactive bladder: Secondary | ICD-10-CM | POA: Diagnosis not present

## 2015-06-21 DIAGNOSIS — E119 Type 2 diabetes mellitus without complications: Secondary | ICD-10-CM | POA: Diagnosis not present

## 2015-06-23 ENCOUNTER — Ambulatory Visit
Admission: RE | Admit: 2015-06-23 | Discharge: 2015-06-23 | Disposition: A | Discharge status: Home / Self Care | Payer: Medicare Other | Source: Ambulatory Visit | Attending: Gynecology | Admitting: Gynecology

## 2015-06-23 DIAGNOSIS — R920 Mammographic microcalcification found on diagnostic imaging of breast: Secondary | ICD-10-CM | POA: Diagnosis not present

## 2015-06-23 DIAGNOSIS — R928 Other abnormal and inconclusive findings on diagnostic imaging of breast: Secondary | ICD-10-CM

## 2015-06-23 DIAGNOSIS — N6011 Diffuse cystic mastopathy of right breast: Secondary | ICD-10-CM | POA: Diagnosis not present

## 2015-06-24 ENCOUNTER — Other Ambulatory Visit: Payer: Self-pay | Admitting: Gynecology

## 2015-06-24 DIAGNOSIS — R921 Mammographic calcification found on diagnostic imaging of breast: Secondary | ICD-10-CM

## 2015-06-29 ENCOUNTER — Ambulatory Visit
Admission: RE | Admit: 2015-06-29 | Discharge: 2015-06-29 | Disposition: A | Discharge status: Home / Self Care | Payer: Medicare Other | Source: Ambulatory Visit | Attending: Gynecology | Admitting: Gynecology

## 2015-06-29 DIAGNOSIS — R921 Mammographic calcification found on diagnostic imaging of breast: Secondary | ICD-10-CM | POA: Diagnosis not present

## 2015-06-29 DIAGNOSIS — N6011 Diffuse cystic mastopathy of right breast: Secondary | ICD-10-CM | POA: Diagnosis not present

## 2015-07-06 ENCOUNTER — Other Ambulatory Visit: Payer: Self-pay | Admitting: Surgery

## 2015-07-06 DIAGNOSIS — N6091 Unspecified benign mammary dysplasia of right breast: Secondary | ICD-10-CM | POA: Diagnosis not present

## 2015-07-13 ENCOUNTER — Other Ambulatory Visit: Payer: Self-pay | Admitting: Surgery

## 2015-07-13 DIAGNOSIS — N6091 Unspecified benign mammary dysplasia of right breast: Secondary | ICD-10-CM

## 2015-07-19 ENCOUNTER — Encounter (HOSPITAL_COMMUNITY)
Admission: RE | Admit: 2015-07-19 | Discharge: 2015-07-19 | Disposition: A | Discharge status: Home / Self Care | Payer: Medicare Other | Source: Ambulatory Visit | Attending: Surgery | Admitting: Surgery

## 2015-07-19 ENCOUNTER — Encounter (HOSPITAL_COMMUNITY): Payer: Self-pay

## 2015-07-19 DIAGNOSIS — N6091 Unspecified benign mammary dysplasia of right breast: Secondary | ICD-10-CM | POA: Diagnosis not present

## 2015-07-19 DIAGNOSIS — E119 Type 2 diabetes mellitus without complications: Secondary | ICD-10-CM | POA: Diagnosis not present

## 2015-07-19 DIAGNOSIS — Z9884 Bariatric surgery status: Secondary | ICD-10-CM | POA: Diagnosis not present

## 2015-07-19 DIAGNOSIS — N62 Hypertrophy of breast: Secondary | ICD-10-CM | POA: Diagnosis not present

## 2015-07-19 DIAGNOSIS — Z7984 Long term (current) use of oral hypoglycemic drugs: Secondary | ICD-10-CM | POA: Diagnosis not present

## 2015-07-19 DIAGNOSIS — F17200 Nicotine dependence, unspecified, uncomplicated: Secondary | ICD-10-CM | POA: Diagnosis not present

## 2015-07-19 DIAGNOSIS — Z6833 Body mass index (BMI) 33.0-33.9, adult: Secondary | ICD-10-CM | POA: Diagnosis not present

## 2015-07-19 DIAGNOSIS — E669 Obesity, unspecified: Secondary | ICD-10-CM | POA: Diagnosis not present

## 2015-07-19 DIAGNOSIS — Z7982 Long term (current) use of aspirin: Secondary | ICD-10-CM | POA: Diagnosis not present

## 2015-07-19 DIAGNOSIS — Z79899 Other long term (current) drug therapy: Secondary | ICD-10-CM | POA: Diagnosis not present

## 2015-07-19 HISTORY — DX: Adverse effect of unspecified anesthetic, initial encounter: T41.45XA

## 2015-07-19 HISTORY — DX: Other complications of anesthesia, initial encounter: T88.59XA

## 2015-07-19 HISTORY — DX: Unspecified symptoms and signs involving the genitourinary system: R39.9

## 2015-07-19 HISTORY — DX: Unspecified osteoarthritis, unspecified site: M19.90

## 2015-07-19 LAB — BASIC METABOLIC PANEL
Anion gap: 10 (ref 5–15)
BUN: 18 mg/dL (ref 6–20)
CHLORIDE: 105 mmol/L (ref 101–111)
CO2: 23 mmol/L (ref 22–32)
Calcium: 9.8 mg/dL (ref 8.9–10.3)
Creatinine, Ser: 0.79 mg/dL (ref 0.44–1.00)
GFR calc Af Amer: >60 mL/min (ref >60)
GFR calc non Af Amer: >60 mL/min (ref >60)
GLUCOSE: 144 mg/dL — AB (ref 65–99)
POTASSIUM: 4 mmol/L (ref 3.5–5.1)
Sodium: 138 mmol/L (ref 135–145)

## 2015-07-19 LAB — CBC
HCT: 42.2 % (ref 36.0–46.0)
Hemoglobin: 14 g/dL (ref 12.0–15.0)
MCH: 30.2 pg (ref 26.0–34.0)
MCHC: 33.2 g/dL (ref 30.0–36.0)
MCV: 91.1 fL (ref 78.0–100.0)
Platelets: 314 10*3/uL (ref 150–400)
RBC: 4.63 MIL/uL (ref 3.87–5.11)
RDW: 13.6 % (ref 11.5–15.5)
WBC: 10.7 10*3/uL — AB (ref 4.0–10.5)

## 2015-07-19 LAB — GLUCOSE, CAPILLARY: Glucose-Capillary: 173 mg/dL — ABNORMAL HIGH (ref 65–99)

## 2015-07-19 NOTE — Pre-Procedure Instructions (Signed)
Alexa Buchanan  07/19/2015      [facility] - [place] - [address] [place] [address] Phone: [phone] Fax: [phone]    Your procedure is scheduled on 07/22/15.  [facility] at [facility] A.M.  Call this number if you have problems the morning of surgery:  [phone]   Remember:  Do not eat food or drink liquids after midnight.  Take these medicines the morning of surgery with A SIP OF WATER --zyrtec,neurontin,zantac,oxybutynin   Do not wear jewelry, make-up or nail polish.  Do not wear lotions, powders, or perfumes.  You may wear deodorant.  Do not shave 48 hours prior to surgery.  Men may shave face and neck.  Do not bring valuables to the hospital.  [facility] is not responsible for any belongings or valuables.  Contacts, dentures or bridgework may not be worn into surgery.  Leave your suitcase in the car.  After surgery it may be brought to your room.  For patients admitted to the hospital, discharge time will be determined by your treatment team.  Patients discharged the day of surgery will not be allowed to drive home.   Special instructions:   Please read over the following fact sheets that you were given.   How to Manage Your Diabetes Before and After Surgery  Why is it important to control my blood sugar before and after surgery? . Improving blood sugar levels before and after surgery helps healing and can limit problems. . A way of improving blood sugar control is eating a healthy diet by: o  Eating less sugar and carbohydrates o  Increasing activity/exercise o  Talking with your doctor about reaching your blood sugar goals . High blood sugars (greater than 180 mg/dL) can raise your risk of infections and slow your recovery, so you will need to focus on controlling your diabetes during the weeks before surgery. . Make sure that the doctor who takes care of your  diabetes knows about your planned surgery including the date and location.  How do I manage my blood sugar before surgery? . Check your blood sugar at least 4 times a day, starting 2 days before surgery, to make sure that the level is not too high or low. o Check your blood sugar the morning of your surgery when you wake up and every 2 hours until you get to the Short Stay unit. . If your blood sugar is less than 70 mg/dL, you will need to treat for low blood sugar: o Do not take insulin. o Treat a low blood sugar (less than 70 mg/dL) with  cup of clear juice (cranberry or apple), 4 glucose tablets, OR glucose gel. o Recheck blood sugar in 15 minutes after treatment (to make sure it is greater than 70 mg/dL). If your blood sugar is not greater than 70 mg/dL on recheck, call [phone] for further instructions. . Report your blood sugar to the short stay nurse when you get to Short Stay.  . If you are admitted to the hospital after surgery: o Your blood sugar will be checked by the staff and you will probably be given insulin after surgery (instead of oral diabetes medicines) to make sure you have good blood sugar levels. o The goal for blood sugar control after surgery is 80-180 mg/dL.              WHAT DO I DO ABOUT MY  DIABETES MEDICATION?   Marland Kitchen Do not take oral diabetes medicines (pills) the morning of surgery.  . THE NIGHT BEFORE SURGERY,  Take usual dose day before surgery       . HE MORNING OF SURGERY, take ______none_______ units of ____none______insulin.  . The day of surgery, do not take other diabetes injectables, including Byetta (exenatide), Bydureon (exenatide ER), Victoza (liraglutide), or Trulicity (dulaglutide).  . If your CBG is greater than 220 mg/dL, you may take  of your sliding scale (correction) dose of insulin.  Other Instructions:          Patient Signature:  Date:   Nurse Signature:  Date:   Reviewed and Endorsed by [facility] Patient  Education Committee, August 2015

## 2015-07-20 ENCOUNTER — Ambulatory Visit
Admission: RE | Admit: 2015-07-20 | Discharge: 2015-07-20 | Disposition: A | Discharge status: Home / Self Care | Payer: Medicare Other | Source: Ambulatory Visit | Attending: Surgery | Admitting: Surgery

## 2015-07-20 DIAGNOSIS — N6091 Unspecified benign mammary dysplasia of right breast: Secondary | ICD-10-CM

## 2015-07-20 DIAGNOSIS — R928 Other abnormal and inconclusive findings on diagnostic imaging of breast: Secondary | ICD-10-CM | POA: Diagnosis not present

## 2015-07-20 LAB — HEMOGLOBIN A1C
Hgb A1c MFr Bld: 7.4 % — ABNORMAL HIGH (ref 4.8–5.6)
MEAN PLASMA GLUCOSE: 166 mg/dL

## 2015-07-21 NOTE — H&P (Signed)
Alexa BargesSusan L. Buchanan 07/06/2015 11:28 AM Location: [facility] Patient #: [id number] DOB: 03/03/1950 Married / Language: English / Race: White Female   History of Present Illness (Lehua Flores A. Magnus IvanBlackman MD; 07/06/2015 12:08 PM) The patient is a 65 year old female who presents with a complaint of Breast problems. This pleasant female is referred by Dr. 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 She has had a suspicious area in the right breast which has been followed for years at this time biopsy was recommended. Atypical lobular hyperplasia was seen. Lumpectomy is now recommended. She has 2 sisters that have both had to have lumpectomies for benign breast disease. There is no history of breast cancer. She is otherwise healthy without complaints. She has no nipple discharge. She handled the biopsy very well.   Other Problems Gilmer Mor(Sonya Bynum, CMA; 07/06/2015 11:28 AM) Diabetes Mellitus  Past Surgical History Gilmer Mor(Sonya Bynum, CMA; 07/06/2015 11:28 AM) Gastric Bypass Hysterectomy (not due to cancer) - Complete Knee Surgery Left. Oral Surgery Shoulder Surgery Right.  Diagnostic Studies History Gilmer Mor(Sonya Bynum, CMA; 07/06/2015 11:28 AM) Colonoscopy 1-5 years ago Mammogram within last year Pap Smear 1-5 years ago  Allergies Gilmer Mor(Sonya Bynum, CMA; 07/06/2015 11:28 AM) No Known Drug Allergies05/10/2015  Medication History (Sonya Bynum, CMA; 07/06/2015 11:30 AM) MetFORMIN HCl (500MG  Tablet, Oral) Active. Multiple Vitamin (Oral) Active. ZyrTEC Allergy (10MG  Capsule, Oral) Active. Ditropan XL (10MG  Tablet ER 24HR, Oral) Active. Aleve (220MG  Capsule, Oral) Active. Victoza (18MG /3ML Solution, Subcutaneous) Active. ClonazePAM (1MG  Tablet, Oral) Active. One Touch Delica Lancets Active. One Touch Test Strips (In Vitro) Active. Medications Reconciled  Social History Gilmer Mor(Sonya Bynum, CMA; 07/06/2015  11:28 AM) Caffeine use Coffee. No alcohol use No drug use Tobacco use Current every day smoker.  Family History Gilmer Mor(Sonya Bynum, CMA; 07/06/2015 11:28 AM) Bleeding disorder Father. Migraine Headache Sister.  Pregnancy / Birth History Gilmer Mor(Sonya Bynum, CMA; 07/06/2015 11:28 AM) Age at menarche 10 years. Age of menopause 246-50 Gravida 3 Maternal age 67-30 Para 2    Review of Systems Lamar Laundry(Sonya Bynum CMA; 07/06/2015 11:28 AM) General Not Present- Appetite Loss, Chills, Fatigue, Fever, Night Sweats, Weight Gain and Weight Loss. Skin Not Present- Change in Wart/Mole, Dryness, Hives, Jaundice, New Lesions, Non-Healing Wounds, Rash and Ulcer. HEENT Not Present- Earache, Hearing Loss, Hoarseness, Nose Bleed, Oral Ulcers, Ringing in the Ears, Seasonal Allergies, Sinus Pain, Sore Throat, Visual Disturbances, Wears glasses/contact lenses and Yellow Eyes. Respiratory Not Present- Bloody sputum, Chronic Cough, Difficulty Breathing, Snoring and Wheezing. Breast Present- Breast Mass. Not Present- Breast Pain, Nipple Discharge and Skin Changes. Cardiovascular Not Present- Chest Pain, Difficulty Breathing Lying Down, Leg Cramps, Palpitations, Rapid Heart Rate, Shortness of Breath and Swelling of Extremities. Gastrointestinal Present- Constipation. Not Present- Abdominal Pain, Bloating, Bloody Stool, Change in Bowel Habits, Chronic diarrhea, Difficulty Swallowing, Excessive gas, Gets full quickly at meals, Hemorrhoids, Indigestion, Nausea, Rectal Pain and Vomiting. Female Genitourinary Not Present- Frequency, Nocturia, Painful Urination, Pelvic Pain and Urgency. Musculoskeletal Not Present- Back Pain, Joint Pain, Joint Stiffness, Muscle Pain, Muscle Weakness and Swelling of Extremities. Neurological Not Present- Decreased Memory, Fainting, Headaches, Numbness, Seizures, Tingling, Tremor, Trouble walking and Weakness. Psychiatric Not Present- Anxiety, Bipolar, Change in Sleep Pattern, Depression, Fearful and  Frequent crying. Endocrine Not Present- Cold Intolerance, Excessive Hunger, Hair Changes, Heat Intolerance, Hot flashes and New Diabetes. Hematology Not Present- Easy Bruising, Excessive bleeding, Gland problems, HIV and Persistent Infections.  Vitals (Sonya Bynum CMA; 07/06/2015 11:28 AM) 07/06/2015  11:28 AM Weight: 202 lb Pulse: 79 (Regular)  BP: 126/76 (Sitting, Left Arm, Standard)       Physical Exam (Toshika Parrow A. Magnus Ivan MD; 07/06/2015 12:09 PM) General Mental Status-Alert. General Appearance-Consistent with stated age. Hydration-Well hydrated. Voice-Normal.  Head and Neck Head-normocephalic, atraumatic with no lesions or palpable masses. Trachea-midline. Thyroid Gland Characteristics - normal size and consistency.  Eye Eyeball - Bilateral-Extraocular movements intact. Sclera/Conjunctiva - Bilateral-No scleral icterus.  Chest and Lung Exam Chest and lung exam reveals -quiet, even and easy respiratory effort with no use of accessory muscles and on auscultation, normal breath sounds, no adventitious sounds and normal vocal resonance. Inspection Chest Wall - Normal. Back - normal.  Breast Breast - Left-Symmetric, Non Tender, No Biopsy scars, no Dimpling, No Inflammation, No Lumpectomy scars, No Mastectomy scars, No Peau d' Orange. Breast - Right-Symmetric, Non Tender, No Biopsy scars, no Dimpling, No Inflammation, No Lumpectomy scars, No Mastectomy scars, No Peau d' Orange. Breast Lump-No Palpable Breast Mass.  Cardiovascular Cardiovascular examination reveals -normal heart sounds, regular rate and rhythm with no murmurs and normal pedal pulses bilaterally.  Abdomen Inspection Inspection of the abdomen reveals - No Hernias. Skin - Scar - no surgical scars. Palpation/Percussion Palpation and Percussion of the abdomen reveal - Soft, Non Tender, No Rebound tenderness, No Rigidity (guarding) and No hepatosplenomegaly. Auscultation Auscultation  of the abdomen reveals - Bowel sounds normal.  Neurologic Neurologic evaluation reveals -alert and oriented x 3 with no impairment of recent or remote memory. Mental Status-Normal.  Musculoskeletal Normal Exam - Left-Upper Extremity Strength Normal and Lower Extremity Strength Normal. Normal Exam - Right-Upper Extremity Strength Normal and Lower Extremity Strength Normal.  Lymphatic - Did not examine.    Assessment & Plan (Wise Fees A. Magnus Ivan MD; 07/06/2015 12:09 PM)  ATYPICAL LOBULAR HYPERPLASIA (ALH) OF RIGHT BREAST (N60.91)  Impression: I explained the diagnosis to her in detail. Radioactive seed right breast lumpectomy is recommended for complete histologic evaluation to rule out malignancy. I discussed the procedure with her in detail. I discussed the risk of surgery which includes but is not limited to bleeding, infection, injury to surrounding structures, need for further surgery malignancy is present, postoperative recovery, etc. She understands and wishes to proceed with surgery which will be scheduled

## 2015-07-22 ENCOUNTER — Ambulatory Visit (HOSPITAL_COMMUNITY): Payer: Medicare Other | Admitting: Certified Registered"

## 2015-07-22 ENCOUNTER — Ambulatory Visit
Admission: RE | Admit: 2015-07-22 | Discharge: 2015-07-22 | Disposition: A | Discharge status: Home / Self Care | Payer: Medicare Other | Source: Ambulatory Visit | Attending: Surgery | Admitting: Surgery

## 2015-07-22 ENCOUNTER — Encounter (HOSPITAL_COMMUNITY): Payer: Self-pay | Admitting: *Deleted

## 2015-07-22 ENCOUNTER — Encounter (HOSPITAL_COMMUNITY)
Admission: RE | Disposition: A | Discharge status: Home / Self Care | Payer: Self-pay | Source: Ambulatory Visit | Attending: Surgery

## 2015-07-22 ENCOUNTER — Ambulatory Visit (HOSPITAL_COMMUNITY)
Admission: RE | Admit: 2015-07-22 | Discharge: 2015-07-22 | Disposition: A | Discharge status: Home / Self Care | Payer: Medicare Other | Source: Ambulatory Visit | Attending: Surgery | Admitting: Surgery

## 2015-07-22 DIAGNOSIS — Z9884 Bariatric surgery status: Secondary | ICD-10-CM | POA: Insufficient documentation

## 2015-07-22 DIAGNOSIS — K589 Irritable bowel syndrome without diarrhea: Secondary | ICD-10-CM | POA: Diagnosis not present

## 2015-07-22 DIAGNOSIS — F17200 Nicotine dependence, unspecified, uncomplicated: Secondary | ICD-10-CM | POA: Insufficient documentation

## 2015-07-22 DIAGNOSIS — N6091 Unspecified benign mammary dysplasia of right breast: Secondary | ICD-10-CM

## 2015-07-22 DIAGNOSIS — M1990 Unspecified osteoarthritis, unspecified site: Secondary | ICD-10-CM | POA: Diagnosis not present

## 2015-07-22 DIAGNOSIS — N62 Hypertrophy of breast: Secondary | ICD-10-CM | POA: Diagnosis not present

## 2015-07-22 DIAGNOSIS — Z6833 Body mass index (BMI) 33.0-33.9, adult: Secondary | ICD-10-CM | POA: Insufficient documentation

## 2015-07-22 DIAGNOSIS — D4861 Neoplasm of uncertain behavior of right breast: Secondary | ICD-10-CM | POA: Diagnosis not present

## 2015-07-22 DIAGNOSIS — E119 Type 2 diabetes mellitus without complications: Secondary | ICD-10-CM | POA: Insufficient documentation

## 2015-07-22 DIAGNOSIS — N6092 Unspecified benign mammary dysplasia of left breast: Secondary | ICD-10-CM | POA: Diagnosis not present

## 2015-07-22 DIAGNOSIS — E669 Obesity, unspecified: Secondary | ICD-10-CM | POA: Insufficient documentation

## 2015-07-22 DIAGNOSIS — Z7984 Long term (current) use of oral hypoglycemic drugs: Secondary | ICD-10-CM | POA: Insufficient documentation

## 2015-07-22 DIAGNOSIS — Z79899 Other long term (current) drug therapy: Secondary | ICD-10-CM | POA: Insufficient documentation

## 2015-07-22 DIAGNOSIS — N6011 Diffuse cystic mastopathy of right breast: Secondary | ICD-10-CM | POA: Diagnosis not present

## 2015-07-22 DIAGNOSIS — Z7982 Long term (current) use of aspirin: Secondary | ICD-10-CM | POA: Insufficient documentation

## 2015-07-22 HISTORY — PX: BREAST LUMPECTOMY WITH RADIOACTIVE SEED LOCALIZATION: SHX6424

## 2015-07-22 LAB — GLUCOSE, CAPILLARY
GLUCOSE-CAPILLARY: 169 mg/dL — AB (ref 65–99)
Glucose-Capillary: 171 mg/dL — ABNORMAL HIGH (ref 65–99)

## 2015-07-22 SURGERY — BREAST LUMPECTOMY WITH RADIOACTIVE SEED LOCALIZATION
Anesthesia: General | Site: Breast | Laterality: Right

## 2015-07-22 MED ORDER — EPHEDRINE SULFATE 50 MG/ML IJ SOLN
INTRAMUSCULAR | Status: DC | PRN
  Administered 2015-07-22 (×2): 10 mg via INTRAVENOUS

## 2015-07-22 MED ORDER — SODIUM CHLORIDE 0.9 % IV SOLN: 250.0000 mL | INTRAVENOUS | Status: DC | PRN

## 2015-07-22 MED ORDER — ACETAMINOPHEN 325 MG PO TABS: 650.0000 mg | ORAL_TABLET | ORAL | Status: DC | PRN

## 2015-07-22 MED ORDER — SODIUM CHLORIDE 0.9% FLUSH: 3.0000 mL | INTRAVENOUS | Status: DC | PRN

## 2015-07-22 MED ORDER — BUPIVACAINE-EPINEPHRINE (PF) 0.25% -1:200000 IJ SOLN
INTRAMUSCULAR | Status: AC
  Filled 2015-07-22: qty 30

## 2015-07-22 MED ORDER — OXYCODONE HCL 5 MG PO TABS: 5.0000 mg | ORAL_TABLET | ORAL | Status: DC | PRN

## 2015-07-22 MED ORDER — PROPOFOL 10 MG/ML IV BOLUS
INTRAVENOUS | Status: AC
  Filled 2015-07-22: qty 20

## 2015-07-22 MED ORDER — MEPERIDINE HCL 25 MG/ML IJ SOLN: 6.2500 mg | INTRAMUSCULAR | Status: DC | PRN

## 2015-07-22 MED ORDER — OXYCODONE-ACETAMINOPHEN 5-325 MG PO TABS: 1.0000 | ORAL_TABLET | ORAL | Status: DC | PRN

## 2015-07-22 MED ORDER — MORPHINE SULFATE (PF) 2 MG/ML IV SOLN: 1.0000 mg | INTRAVENOUS | Status: DC | PRN

## 2015-07-22 MED ORDER — MIDAZOLAM HCL 2 MG/2ML IJ SOLN
INTRAMUSCULAR | Status: DC | PRN
  Administered 2015-07-22: 2 mg via INTRAVENOUS

## 2015-07-22 MED ORDER — SODIUM CHLORIDE 0.9% FLUSH: 3.0000 mL | Freq: Two times a day (BID) | INTRAVENOUS | Status: DC

## 2015-07-22 MED ORDER — LIDOCAINE HCL (CARDIAC) 20 MG/ML IV SOLN
INTRAVENOUS | Status: DC | PRN
  Administered 2015-07-22: 5 mL via INTRATRACHEAL

## 2015-07-22 MED ORDER — LACTATED RINGERS IV SOLN
INTRAVENOUS | Status: DC
  Administered 2015-07-22: 08:00:00 via INTRAVENOUS

## 2015-07-22 MED ORDER — MIDAZOLAM HCL 2 MG/2ML IJ SOLN
INTRAMUSCULAR | Status: AC
  Filled 2015-07-22: qty 2

## 2015-07-22 MED ORDER — BUPIVACAINE-EPINEPHRINE 0.25% -1:200000 IJ SOLN
INTRAMUSCULAR | Status: DC | PRN
  Administered 2015-07-22: 20 mL

## 2015-07-22 MED ORDER — ACETAMINOPHEN 650 MG RE SUPP: 650.0000 mg | RECTAL | Status: DC | PRN

## 2015-07-22 MED ORDER — PROPOFOL 10 MG/ML IV BOLUS
INTRAVENOUS | Status: DC | PRN
  Administered 2015-07-22: 160 mg via INTRAVENOUS

## 2015-07-22 MED ORDER — CIPROFLOXACIN IN D5W 400 MG/200ML IV SOLN
INTRAVENOUS | Status: AC
  Filled 2015-07-22: qty 200

## 2015-07-22 MED ORDER — FENTANYL CITRATE (PF) 250 MCG/5ML IJ SOLN
INTRAMUSCULAR | Status: DC | PRN
  Administered 2015-07-22 (×2): 50 ug via INTRAVENOUS
  Administered 2015-07-22: 150 ug via INTRAVENOUS

## 2015-07-22 MED ORDER — CIPROFLOXACIN IN D5W 400 MG/200ML IV SOLN
400.0000 mg | INTRAVENOUS | Status: AC
  Administered 2015-07-22: 400 mg via INTRAVENOUS

## 2015-07-22 MED ORDER — HYDROMORPHONE HCL 1 MG/ML IJ SOLN: 0.2500 mg | INTRAMUSCULAR | Status: DC | PRN

## 2015-07-22 MED ORDER — FENTANYL CITRATE (PF) 250 MCG/5ML IJ SOLN
INTRAMUSCULAR | Status: AC
  Filled 2015-07-22: qty 5

## 2015-07-22 MED ORDER — PHENYLEPHRINE HCL 10 MG/ML IJ SOLN
INTRAMUSCULAR | Status: DC | PRN
  Administered 2015-07-22: 120 ug via INTRAVENOUS

## 2015-07-22 MED ORDER — 0.9 % SODIUM CHLORIDE (POUR BTL) OPTIME
TOPICAL | Status: DC | PRN
  Administered 2015-07-22: 1000 mL

## 2015-07-22 MED ORDER — ONDANSETRON HCL 4 MG/2ML IJ SOLN
INTRAMUSCULAR | Status: DC | PRN
  Administered 2015-07-22: 4 mg via INTRAVENOUS

## 2015-07-22 MED ORDER — PHENYLEPHRINE 40 MCG/ML (10ML) SYRINGE FOR IV PUSH (FOR BLOOD PRESSURE SUPPORT)
PREFILLED_SYRINGE | INTRAVENOUS | Status: AC
  Filled 2015-07-22: qty 10

## 2015-07-22 SURGICAL SUPPLY — 39 items
APPLIER CLIP 9.375 MED OPEN (MISCELLANEOUS)
APR CLP MED 9.3 20 MLT OPN (MISCELLANEOUS)
BINDER BREAST LRG (GAUZE/BANDAGES/DRESSINGS) IMPLANT
BINDER BREAST XLRG (GAUZE/BANDAGES/DRESSINGS) ×1 IMPLANT
BLADE SURG 15 STRL LF DISP TIS (BLADE) ×1 IMPLANT
BLADE SURG 15 STRL SS (BLADE) ×2
CANISTER SUCTION 2500CC (MISCELLANEOUS) ×2 IMPLANT
CHLORAPREP W/TINT 26ML (MISCELLANEOUS) ×2 IMPLANT
CLIP APPLIE 9.375 MED OPEN (MISCELLANEOUS) ×1 IMPLANT
COVER PROBE W GEL 5X96 (DRAPES) ×2 IMPLANT
COVER SURGICAL LIGHT HANDLE (MISCELLANEOUS) ×2 IMPLANT
DEVICE DUBIN SPECIMEN MAMMOGRA (MISCELLANEOUS) ×2 IMPLANT
DRAPE CHEST BREAST 15X10 FENES (DRAPES) ×2 IMPLANT
DRAPE UTILITY XL STRL (DRAPES) ×2 IMPLANT
ELECT CAUTERY BLADE 6.4 (BLADE) ×2 IMPLANT
ELECT REM PT RETURN 9FT ADLT (ELECTROSURGICAL) ×2
ELECTRODE REM PT RTRN 9FT ADLT (ELECTROSURGICAL) ×1 IMPLANT
GLOVE SURG SIGNA 7.5 PF LTX (GLOVE) ×2 IMPLANT
GOWN STRL REUS W/ TWL LRG LVL3 (GOWN DISPOSABLE) ×1 IMPLANT
GOWN STRL REUS W/ TWL XL LVL3 (GOWN DISPOSABLE) ×1 IMPLANT
GOWN STRL REUS W/TWL LRG LVL3 (GOWN DISPOSABLE) ×2
GOWN STRL REUS W/TWL XL LVL3 (GOWN DISPOSABLE) ×2
KIT BASIN OR (CUSTOM PROCEDURE TRAY) ×2 IMPLANT
KIT MARKER MARGIN INK (KITS) ×2 IMPLANT
LIQUID BAND (GAUZE/BANDAGES/DRESSINGS) ×2 IMPLANT
NDL HYPO 25X1 1.5 SAFETY (NEEDLE) ×1 IMPLANT
NEEDLE HYPO 25X1 1.5 SAFETY (NEEDLE) ×2 IMPLANT
NS IRRIG 1000ML POUR BTL (IV SOLUTION) IMPLANT
PACK SURGICAL SETUP 50X90 (CUSTOM PROCEDURE TRAY) ×2 IMPLANT
PENCIL BUTTON HOLSTER BLD 10FT (ELECTRODE) ×2 IMPLANT
SPONGE LAP 18X18 X RAY DECT (DISPOSABLE) ×2 IMPLANT
SUT MNCRL AB 4-0 PS2 18 (SUTURE) ×2 IMPLANT
SUT VIC AB 3-0 SH 18 (SUTURE) ×2 IMPLANT
SYR BULB 3OZ (MISCELLANEOUS) ×2 IMPLANT
SYR CONTROL 10ML LL (SYRINGE) ×2 IMPLANT
TOWEL OR 17X24 6PK STRL BLUE (TOWEL DISPOSABLE) ×2 IMPLANT
TOWEL OR 17X26 10 PK STRL BLUE (TOWEL DISPOSABLE) ×2 IMPLANT
TUBE CONNECTING 12X1/4 (SUCTIONS) ×2 IMPLANT
YANKAUER SUCT BULB TIP NO VENT (SUCTIONS) ×2 IMPLANT

## 2015-07-22 NOTE — Op Note (Signed)
BREAST LUMPECTOMY WITH RADIOACTIVE SEED LOCALIZATION  Procedure Note  Alexa Buchanan 07/22/2015   Pre-op Diagnosis: Atypical lobular hyperplasia right breast      Post-op Diagnosis: same  Procedure(s): BREAST LUMPECTOMY WITH RADIOACTIVE SEED LOCALIZATION  Surgeon(s): Abigail Miyamotoouglas Illene Sweeting, MD  Anesthesia: General  Staff:  Circulator: Christene Slatesushdan M Islam, RN Scrub Person: Carmela Rimaebecca K Leggio Circulator Assistant: Woodroe ModeKelly Buchanan Hickling, RN; Maureen RalphsSheila C Bowman, RN  Estimated Blood Loss: Minimal               Specimens: sent to path          [facility] LLCBLACKMAN,Alexa Buchanan   Date: 07/22/2015  Time: 9:09 AM

## 2015-07-22 NOTE — Interval H&P Note (Signed)
History and Physical Interval Note:no change in H and P  07/22/2015 8:13 AM  Alexa Buchanan  has presented today for surgery, with the diagnosis of Atypical lobular hyperplasia right breast   The various methods of treatment have been discussed with the patient and family. After consideration of risks, benefits and other options for treatment, the patient has consented to  Procedure(s): BREAST LUMPECTOMY WITH RADIOACTIVE SEED LOCALIZATION (Right) as a surgical intervention .  The patient's history has been reviewed, patient examined, no change in status, stable for surgery.  I have reviewed the patient's chart and labs.  Questions were answered to the patient's satisfaction.     Glorious Flicker A

## 2015-07-22 NOTE — Anesthesia Preprocedure Evaluation (Signed)
Anesthesia Evaluation  Patient identified by MRN, date of birth, ID band Patient awake    Reviewed: Allergy & Precautions, NPO status , Patient's Chart, lab work & pertinent test results  Airway Mallampati: II  TM Distance: >3 FB Neck ROM: Full    Dental no notable dental hx.    Pulmonary sleep apnea , Current Smoker,    Pulmonary exam normal breath sounds clear to auscultation       Cardiovascular negative cardio ROS Normal cardiovascular exam Rhythm:Regular Rate:Normal     Neuro/Psych  Headaches, PSYCHIATRIC DISORDERS Depression    GI/Hepatic negative GI ROS, Neg liver ROS,   Endo/Other  diabetes, Type 2, Oral Hypoglycemic Agents  Renal/GU negative Renal ROS     Musculoskeletal negative musculoskeletal ROS (+) Arthritis ,   Abdominal (+) + obese,   Peds  Hematology negative hematology ROS (+)   Anesthesia Other Findings   Reproductive/Obstetrics negative OB ROS                             Anesthesia Physical Anesthesia Plan  ASA: II  Anesthesia Plan: General   Post-op Pain Management:    Induction: Intravenous  Airway Management Planned: LMA  Additional Equipment:   Intra-op Plan:   Post-operative Plan: Extubation in OR  Informed Consent: I have reviewed the patients History and Physical, chart, labs and discussed the procedure including the risks, benefits and alternatives for the proposed anesthesia with the patient or authorized representative who has indicated his/her understanding and acceptance.   Dental advisory given  Plan Discussed with: CRNA  Anesthesia Plan Comments:         Anesthesia Quick Evaluation

## 2015-07-22 NOTE — Transfer of Care (Signed)
Immediate Anesthesia Transfer of Care Note  Patient: Alexa Buchanan  Procedure(s) Performed: Procedure(s): BREAST LUMPECTOMY WITH RADIOACTIVE SEED LOCALIZATION (Right)  Patient Location: PACU  Anesthesia Type:General  Level of Consciousness: awake, alert , oriented and patient cooperative  Airway & Oxygen Therapy: Patient Spontanous Breathing  Post-op Assessment: Report given to RN, Post -op Vital signs reviewed and stable and Patient moving all extremities X 4  Post vital signs: Reviewed and stable  Last Vitals:  Filed Vitals:   07/22/15 0707 07/22/15 0709  BP: 134/56   Pulse: 8 80  Temp: 36.7 C   Resp: 18     Last Pain: There were no vitals filed for this visit.       Complications: No apparent anesthesia complications

## 2015-07-22 NOTE — Discharge Instructions (Signed)
[facility] °Office Phone Number [phone] ° °BREAST BIOPSY/ PARTIAL MASTECTOMY: POST OP INSTRUCTIONS ° °Always review your discharge instruction sheet given to you by the facility where your surgery was performed. ° °IF YOU HAVE DISABILITY OR FAMILY LEAVE FORMS, YOU MUST BRING THEM TO THE OFFICE FOR PROCESSING.  DO NOT GIVE THEM TO YOUR DOCTOR. ° °1. A prescription for pain medication may be given to you upon discharge.  Take your pain medication as prescribed, if needed.  If narcotic pain medicine is not needed, then you may take acetaminophen (Tylenol) or ibuprofen (Advil) as needed. °2. Take your usually prescribed medications unless otherwise directed °3. If you need a refill on your pain medication, please contact your pharmacy.  They will contact our office to request authorization.  Prescriptions will not be filled after 5pm or on week-ends. °4. You should eat very light the first 24 hours after surgery, such as soup, crackers, pudding, etc.  Resume your normal diet the day after surgery. °5. Most patients will experience some swelling and bruising in the breast.  Ice packs and a good support bra will help.  Swelling and bruising can take several days to resolve.  °6. It is common to experience some constipation if taking pain medication after surgery.  Increasing fluid intake and taking a stool softener will usually help or prevent this problem from occurring.  A mild laxative (Milk of Magnesia or Miralax) should be taken according to package directions if there are no bowel movements after 48 hours. °7. Unless discharge instructions indicate otherwise, you may remove your bandages 24-48 hours after surgery, and you may shower at that time.  You may have steri-strips (small skin tapes) in place directly over the incision.  These strips should be left on the skin for 7-10 days.  If your surgeon used skin glue on the incision, you may shower in 24 hours.  The glue will flake off over the  next 2-3 weeks.  Any sutures or staples will be removed at the office during your follow-up visit. °8. ACTIVITIES:  You may resume regular daily activities (gradually increasing) beginning the next day.  Wearing a good support bra or sports bra minimizes pain and swelling.  You may have sexual intercourse when it is comfortable. °a. You may drive when you no longer are taking prescription pain medication, you can comfortably wear a seatbelt, and you can safely maneuver your car and apply brakes. °b. RETURN TO WORK:  ______________________________________________________________________________________ °9. You should see your doctor in the office for a follow-up appointment approximately two weeks after your surgery.  Your doctor’s nurse will typically make your follow-up appointment when she calls you with your pathology report.  Expect your pathology report 2-3 business days after your surgery.  You may call to check if you do not hear from us after three days. °10. OTHER INSTRUCTIONS: _______________________________________________________________________________________________ _____________________________________________________________________________________________________________________________________ °_____________________________________________________________________________________________________________________________________ °_____________________________________________________________________________________________________________________________________ ° °WHEN TO CALL YOUR DOCTOR: °1. Fever over 101.0 °2. Nausea and/or vomiting. °3. Extreme swelling or bruising. °4. Continued bleeding from incision. °5. Increased pain, redness, or drainage from the incision. ° °The clinic staff is available to answer your questions during regular business hours.  Please don’t hesitate to call and ask to speak to one of the nurses for clinical concerns.  If you have a medical emergency, go to the nearest  emergency room or call 911.  A surgeon from [facility] is always on call at the hospital. ° °For further questions, please visit [email or web address]  °

## 2015-07-22 NOTE — Anesthesia Postprocedure Evaluation (Signed)
Anesthesia Post Note  Patient: Alexa Buchanan  Procedure(s) Performed: Procedure(s) (LRB): BREAST LUMPECTOMY WITH RADIOACTIVE SEED LOCALIZATION (Right)  Patient location during evaluation: PACU Anesthesia Type: General Level of consciousness: sedated and patient cooperative Pain management: pain level controlled Vital Signs Assessment: post-procedure vital signs reviewed and stable Respiratory status: spontaneous breathing Cardiovascular status: stable Anesthetic complications: no    Last Vitals:  Filed Vitals:   07/22/15 0943 07/22/15 0950  BP: 114/60 133/72  Pulse: 96 100  Temp: 36.6 C   Resp: 18     Last Pain:  Filed Vitals:   07/22/15 0953  PainSc: 1                  Lewie LoronJohn Arielis Leonhart

## 2015-07-22 NOTE — Anesthesia Procedure Notes (Signed)
Procedure Name: LMA Insertion Date/Time: 07/22/2015 8:36 AM Performed by: Rosiland OzMEYERS, Raeghan Demeter Pre-anesthesia Checklist: Patient identified, Timeout performed, Emergency Drugs available, Suction available and Patient being monitored Patient Re-evaluated:Patient Re-evaluated prior to inductionOxygen Delivery Method: Circle system utilized Preoxygenation: Pre-oxygenation with 100% oxygen Intubation Type: IV induction LMA: LMA inserted LMA Size: 4.0 Number of attempts: 1 Tube secured with: Tape Dental Injury: Teeth and Oropharynx as per pre-operative assessment

## 2015-07-23 ENCOUNTER — Encounter (HOSPITAL_COMMUNITY): Payer: Self-pay | Admitting: Surgery

## 2015-07-23 NOTE — Op Note (Signed)
[employer]:  Buchanan, Alexa              ACCOUNT NO.:  [id number]  MEDICAL RECORD NO.:  [id number]  LOCATION:  [facility]                         FACILITY:  [facility]  PHYSICIAN:  Abigail Miyamotoouglas Shakeia Krus, M.D. DATE OF BIRTH:  [birth year]  DATE OF PROCEDURE:  07/22/2015 DATE OF DISCHARGE:                              OPERATIVE REPORT   PREOPERATIVE DIAGNOSIS:  Atypical lobular hyperplasia of the right breast.  POSTOPERATIVE DIAGNOSIS:  Atypical lobular hyperplasia of the right breast.  PROCEDURE:  Radioactive seed guided right breast lumpectomy.  SURGEON:  Abigail Miyamotoouglas Aidynn Polendo, M.D.  ANESTHESIA:  General and 0.25% Marcaine.  ESTIMATED BLOOD LOSS:  Minimal.  INDICATIONS:  This is a 65 year old female who was found to have abnormal calcifications of the right breast on screening mammography. Stereotactic biopsy was performed, which showed atypical lobular hyperplasia.  Decision was made to proceed with a lumpectomy.  PROCEDURE IN DETAIL:  The patient was identified in the holding area. She had already had the radioactive seed placed in the right breast.  It was confirmed, but Neoprobe to be in the right breast.  She was then taken to the operating room.  She was placed supine on the operating table.  General anesthesia was induced.  Her right breast was then prepped and draped in the usual sterile fashion.  The area of the seed was in the right upper outer quadrant of the right breast.  I localized an area on the skin with the Neoprobe and then I anesthetized the skin with Marcaine and made an incision with a scalpel.  I then took this down to the breast tissue with electrocautery.  I then performed a wide lumpectomy going down the chest wall with the aid of the Neoprobe staying around the radioactive seed.  Once the lumpectomy specimen was completely excised, I confirmed that the seed was in the biopsy specimen.  I then painted all margins with marker paint.  An x-ray was performed confirming that the  radioactive seed and previous marker were in the specimen.  It was then sent to Pathology for evaluation.  I then anesthetized the wound further with Marcaine.  I achieved hemostasis with cautery.  I then closed subcutaneous tissue with interrupted 3-0 Vicryl sutures and closed the skin with a running 4-0 Monocryl.  Skin glue was then applied.  The patient tolerated the procedure well.  All the counts were corrected at the end of procedure.  The patient was then extubated in the operating room and was taken in a stable condition to the recovery room.     Abigail Miyamotoouglas Kelen Laura, M.D.     DB/MEDQ  D:  07/22/2015  T:  07/22/2015  Job:  [id number]

## 2015-07-27 DIAGNOSIS — E119 Type 2 diabetes mellitus without complications: Secondary | ICD-10-CM | POA: Diagnosis not present

## 2015-08-17 ENCOUNTER — Telehealth: Payer: Self-pay

## 2015-08-17 NOTE — Telephone Encounter (Signed)
i called patient, and she has chosen pcp outside of [place]---dr scott holwerda, MD---no further follow up needed

## 2015-08-17 NOTE — Telephone Encounter (Signed)
-----   Message from Fayrene FearingJulie R Johnston sent at 07/23/2015  9:59 PM EDT ----- Needs new PCP  ----- Message -----    From: Pecola LawlessWilliam F Hopper, MD    Sent: 07/23/2015  11:28 AM      To: Fayrene FearingJulie R Johnston  Please verify patient's new primary care physician and route reports to that individual. Thank you very much. ----- Message -----    From: Lab in [facility] Interface    Sent: 07/23/2015  10:55 AM      To: Pecola LawlessWilliam F Hopper, MD

## 2015-09-23 DIAGNOSIS — N390 Urinary tract infection, site not specified: Secondary | ICD-10-CM | POA: Diagnosis not present

## 2015-09-23 DIAGNOSIS — R3915 Urgency of urination: Secondary | ICD-10-CM | POA: Diagnosis not present

## 2015-09-23 DIAGNOSIS — R350 Frequency of micturition: Secondary | ICD-10-CM | POA: Diagnosis not present

## 2015-10-08 DIAGNOSIS — E1140 Type 2 diabetes mellitus with diabetic neuropathy, unspecified: Secondary | ICD-10-CM | POA: Diagnosis not present

## 2015-10-08 DIAGNOSIS — E119 Type 2 diabetes mellitus without complications: Secondary | ICD-10-CM | POA: Diagnosis not present

## 2015-10-08 DIAGNOSIS — N390 Urinary tract infection, site not specified: Secondary | ICD-10-CM | POA: Diagnosis not present

## 2015-10-08 DIAGNOSIS — Z6832 Body mass index (BMI) 32.0-32.9, adult: Secondary | ICD-10-CM | POA: Diagnosis not present

## 2015-10-08 DIAGNOSIS — R05 Cough: Secondary | ICD-10-CM | POA: Diagnosis not present

## 2015-10-28 DIAGNOSIS — R350 Frequency of micturition: Secondary | ICD-10-CM | POA: Diagnosis not present

## 2015-10-28 DIAGNOSIS — N3941 Urge incontinence: Secondary | ICD-10-CM | POA: Diagnosis not present

## 2015-10-28 DIAGNOSIS — R3915 Urgency of urination: Secondary | ICD-10-CM | POA: Diagnosis not present

## 2015-10-28 DIAGNOSIS — R8290 Unspecified abnormal findings in urine: Secondary | ICD-10-CM | POA: Diagnosis not present

## 2015-10-28 DIAGNOSIS — R3129 Other microscopic hematuria: Secondary | ICD-10-CM | POA: Diagnosis not present

## 2015-10-28 DIAGNOSIS — Z87440 Personal history of urinary (tract) infections: Secondary | ICD-10-CM | POA: Diagnosis not present

## 2015-11-15 ENCOUNTER — Encounter: Payer: Self-pay | Admitting: Gynecology

## 2015-11-15 ENCOUNTER — Ambulatory Visit (INDEPENDENT_AMBULATORY_CARE_PROVIDER_SITE_OTHER): Payer: Medicare Other | Admitting: Gynecology

## 2015-11-15 VITALS — BP 120/66 | Ht 64.0 in | Wt 193.0 lb

## 2015-11-15 DIAGNOSIS — Z01419 Encounter for gynecological examination (general) (routine) without abnormal findings: Secondary | ICD-10-CM

## 2015-11-15 DIAGNOSIS — R350 Frequency of micturition: Secondary | ICD-10-CM | POA: Diagnosis not present

## 2015-11-15 DIAGNOSIS — N952 Postmenopausal atrophic vaginitis: Secondary | ICD-10-CM

## 2015-11-15 LAB — URINALYSIS W MICROSCOPIC + REFLEX CULTURE
BILIRUBIN URINE: NEGATIVE
Bacteria, UA: NONE SEEN [HPF]
GLUCOSE, UA: NEGATIVE
Hgb urine dipstick: NEGATIVE
KETONES UR: NEGATIVE
LEUKOCYTES UA: NEGATIVE
NITRITE: NEGATIVE
PH: 6 (ref 5.0–8.0)
Protein, ur: NEGATIVE
RBC / HPF: NONE SEEN RBC/HPF (ref <=2)
SPECIFIC GRAVITY, URINE: 1.025 (ref 1.001–1.035)
Yeast: NONE SEEN [HPF]

## 2015-11-15 NOTE — Progress Notes (Signed)
    Alexa Buchanan 06/21/1950 [id number]        65 y.o.  [id number]  for breast and pelvic exam.  Past medical history,surgical history, problem list, medications, allergies, family history and social history were all reviewed and documented as reviewed in the EPIC chart.  ROS:  Performed with pertinent positives and negatives included in the history, assessment and plan.   Additional significant findings :  None   Exam: Kennon PortelaKim Gardner assistant Vitals:   11/15/15 0855  BP: 120/66  Weight: 193 lb (87.5 kg)  Height: 5\' 4"  (1.626 m)   Body mass index is 33.13 kg/m.  General appearance:  Normal affect, orientation and appearance. Skin: Grossly normal HEENT: Without gross lesions.  No cervical or supraclavicular adenopathy. Thyroid normal.  Lungs:  Clear without wheezing, rales or rhonchi Cardiac: RR, without RMG Abdominal:  Soft, nontender, without masses, guarding, rebound, organomegaly or hernia Breasts:  Examined lying and sitting without masses, retractions, discharge or axillary adenopathy.  Well-healed right lumpectomy scar Pelvic:  Ext/BUS/Vagina With atrophic changes.  Adnexa without masses or tenderness    Anus and perineum normal   Rectovaginal normal sphincter tone without palpated masses or tenderness.    Assessment/Plan:  65 y.o. [id number] female for breast and pelvic exam..   1. Postmenopausal/atrophic genital changes. Status post TVH BSO sling for uterine prolapse 2003. Doing well without significant hot flushes, night sweats or vaginal dryness. 2. Pap smear 2015. No Pap smear done today. No history of significant abnormal Pap smears previously. Options to stop screening versus less frequent screening intervals reviewed. Will readdress on annual basis. 3. DEXA 2012 normal. Plan repeat DEXA now at 5 year interval. Patient will schedule. 4. Mammography 05/2015.  At benign breast biopsy 06/2015 for abnormal area on mammogram. Follow up for mammography as recommended. SBE  monthly reviewed. 5. Colonoscopy 2010 with reported repeat interval 10 years. 6. Health maintenance. No routine lab work done as patient does this through her primary physician's office. Follow up 1 year, sooner as needed.   Dara LordsFONTAINE,[name] P MD, 9:28 AM 11/15/2015

## 2015-11-15 NOTE — Addendum Note (Signed)
Addended by: Dayna BarkerGARDNER, KIMBERLY K on: 11/15/2015 10:04 AM   Modules accepted: Orders

## 2015-11-15 NOTE — Patient Instructions (Signed)

## 2015-11-17 LAB — URINE CULTURE

## 2015-12-24 DIAGNOSIS — R05 Cough: Secondary | ICD-10-CM | POA: Diagnosis not present

## 2015-12-24 DIAGNOSIS — M25519 Pain in unspecified shoulder: Secondary | ICD-10-CM | POA: Diagnosis not present

## 2015-12-24 DIAGNOSIS — J208 Acute bronchitis due to other specified organisms: Secondary | ICD-10-CM | POA: Diagnosis not present

## 2015-12-24 DIAGNOSIS — Z6832 Body mass index (BMI) 32.0-32.9, adult: Secondary | ICD-10-CM | POA: Diagnosis not present

## 2016-01-11 DIAGNOSIS — F418 Other specified anxiety disorders: Secondary | ICD-10-CM | POA: Diagnosis not present

## 2016-01-11 DIAGNOSIS — E119 Type 2 diabetes mellitus without complications: Secondary | ICD-10-CM | POA: Diagnosis not present

## 2016-01-11 DIAGNOSIS — Z23 Encounter for immunization: Secondary | ICD-10-CM | POA: Diagnosis not present

## 2016-01-11 DIAGNOSIS — Z1159 Encounter for screening for other viral diseases: Secondary | ICD-10-CM | POA: Diagnosis not present

## 2016-01-11 DIAGNOSIS — Z6832 Body mass index (BMI) 32.0-32.9, adult: Secondary | ICD-10-CM | POA: Diagnosis not present

## 2016-01-11 DIAGNOSIS — R05 Cough: Secondary | ICD-10-CM | POA: Diagnosis not present

## 2016-02-01 DIAGNOSIS — D2312 Other benign neoplasm of skin of left eyelid, including canthus: Secondary | ICD-10-CM | POA: Diagnosis not present

## 2016-02-03 DIAGNOSIS — D2312 Other benign neoplasm of skin of left eyelid, including canthus: Secondary | ICD-10-CM | POA: Diagnosis not present

## 2016-02-24 DIAGNOSIS — Z720 Tobacco use: Secondary | ICD-10-CM | POA: Diagnosis not present

## 2016-02-24 DIAGNOSIS — Z6832 Body mass index (BMI) 32.0-32.9, adult: Secondary | ICD-10-CM | POA: Diagnosis not present

## 2016-02-24 DIAGNOSIS — R05 Cough: Secondary | ICD-10-CM | POA: Diagnosis not present

## 2016-02-24 DIAGNOSIS — J45909 Unspecified asthma, uncomplicated: Secondary | ICD-10-CM | POA: Diagnosis not present

## 2016-03-06 ENCOUNTER — Other Ambulatory Visit (INDEPENDENT_AMBULATORY_CARE_PROVIDER_SITE_OTHER): Payer: Self-pay

## 2016-03-06 ENCOUNTER — Ambulatory Visit (INDEPENDENT_AMBULATORY_CARE_PROVIDER_SITE_OTHER): Payer: Medicare Other | Admitting: Orthopaedic Surgery

## 2016-03-06 ENCOUNTER — Institutional Professional Consult (permissible substitution): Payer: Medicare Other | Admitting: Pulmonary Disease

## 2016-03-06 ENCOUNTER — Encounter (INDEPENDENT_AMBULATORY_CARE_PROVIDER_SITE_OTHER): Payer: Self-pay | Admitting: Orthopaedic Surgery

## 2016-03-06 VITALS — BP 147/71 | HR 86

## 2016-03-06 DIAGNOSIS — M79602 Pain in left arm: Secondary | ICD-10-CM

## 2016-03-06 DIAGNOSIS — M25512 Pain in left shoulder: Principal | ICD-10-CM

## 2016-03-06 DIAGNOSIS — G8929 Other chronic pain: Secondary | ICD-10-CM

## 2016-03-06 DIAGNOSIS — M79601 Pain in right arm: Secondary | ICD-10-CM | POA: Diagnosis not present

## 2016-03-06 DIAGNOSIS — M25511 Pain in right shoulder: Principal | ICD-10-CM

## 2016-03-06 NOTE — Progress Notes (Signed)
Office Visit Note   Patient: Alexa Buchanan           Date of Birth: 03/04/1950           MRN: [id number] Visit Date: 03/06/2016              Requested by: Alysia PennaScott Holwerda, MD [address] [place], [place] [address] PCP: Alysia PennaHOLWERDA, SCOTT, MD   Assessment & Plan: Visit Diagnoses: bilateral arm pain appears to be referred from shoulders-early adhesive capsulitis  Plan: [facility]  Follow-Up Instructions: No Follow-up on file.   Orders:  No orders of the defined types were placed in this encounter.  No orders of the defined types were placed in this encounter.     Procedures: No procedures performed   Clinical Data: No additional findings.   Subjective: No chief complaint on file.   Pt having sharp BIL  bicep pain and has been going on for years.  Mrs. Aye has been experiencing bilateral arm pain for "a long time. She denies history of injury or trauma. She's had a prior rotator cuff tear repair of her right shoulder notes that it is "perfect". She might be experiencing a little bit more trouble on the left than she is on the right. It only seems to be a problem when she she attempts to fasten her bra from the back. She denies numbness or tingling. There is no pain distal to the elbow. She  has no trouble sleeping. There is no difficulty with motion overhead. There really is no compromise of her activities.  Mrs Kerin Pernaolinski did have a lumpectomy earlier in 2017 with radioactive seed implants.  Review of Systems   Objective: Vital Signs: LMP 02/28/1995   Physical Exam  Ortho Exam painless range of motion of cervical spine in flexion, extension and internal/external rotation. No referred pain to either shoulder with neck motion.Right Shoulder was minimally tight in overhead motion . Both shoulders were tight with external rotation. Negative impingement and negative empty can testing. No evidence of weakness. Neurovascular  exam intact distally. Specialty Comments:  No specialty comments available.  Imaging: No results found.   PMFS History: Patient Active Problem List   Diagnosis Date Noted  . Acute bronchitis 02/10/2014  . Smoker 11/12/2013  . Type II or unspecified type diabetes mellitus without mention of complication, uncontrolled 12/03/2012  . Elevated blood pressure reading without diagnosis of hypertension 09/04/2012  . OSA (obstructive sleep apnea) 07/27/2011  . Insomnia 07/27/2011  . Wheezing 07/27/2011  . Unspecified adverse effect of unspecified drug, medicinal and biological substance 07/04/2011  . Vitamin D deficiency 12/29/2010  . CERVICAL RADICULOPATHY, RIGHT 04/21/2010  . 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  . POSTMENOPAUSAL SYNDROME 06/03/2009  . DIVERTICULOSIS, COLON 09/14/2008  . DYSMETABOLIC SYNDROME X 09/10/2007  . IBS 09/10/2007  . 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 DEPRESSION 02/04/2007  . MIGRAINE HEADACHE 02/04/2007  . STRESS INCONTINENCE 02/04/2007  . HISTOPLASMOSIS, HX OF 02/04/2007  . LIVER FUNCTION TESTS, ABNORMAL, HX OF 02/04/2007  . COLITIS, HX OF 02/04/2007   Past Medical History:  Diagnosis Date  . Arthritis   . Cancer (HCC)    Breast  . Complication of anesthesia    woke up to soon.  reported "bad gag reflex"     . Depression    pt states she is not depressed  . Diabetes mellitus    Type 2  . Diverticulosis   . Dysmetabolic syndrome X   .  Histoplasmosis 1971  . IBS (irritable bowel syndrome)   . Migraines    ice pick headaches, Dr. Marylou Flesher  . OSA (obstructive sleep apnea) 07/27/2011   Home sleep study 08/18/11 >> AHI 8.1, SpO2 low 74%.  pt stated she does not have sleep apnes  . Seasonal allergies   . UTI symptoms     Family History  Problem Relation Age of Onset  . Leukemia Father     CLL  . Cancer Father     leukemia  . Hyperlipidemia Mother   . Multiple sclerosis Mother   . Hypertension Mother     . Diabetes Maternal Grandmother     also had malignant lower neck mass of unknown etiology  . Breast cancer Sister     age 19  . Hypertension Sister   . Stroke Maternal Grandfather 61  . Coronary artery disease Maternal Uncle   . Uterine cancer Paternal Aunt   . Heart disease Maternal Aunt     porcine valve  . Heart disease Maternal Uncle     valvular disease    Past Surgical History:  Procedure Laterality Date  . BREAST LUMPECTOMY WITH RADIOACTIVE SEED LOCALIZATION Right 07/22/2015   Procedure: BREAST LUMPECTOMY WITH RADIOACTIVE SEED LOCALIZATION;  Surgeon: Abigail Miyamoto, MD;  Location: [facility] OR;  Service: General;  Laterality: Right;  . COLONOSCOPY     Tics; Dr Juanda Chance  . DILATION AND CURETTAGE OF UTERUS    . Gastric Plication  1981  . KNEE ARTHROSCOPY  1990  . LASER ABLATION OF VASCULAR LESION  2009   Dr. Dorma Russell  . NOSE SURGERY    . RECTOCELE REPAIR  2003   with vaginal wall repair and sling  . ROTATOR CUFF REPAIR  2016  . TONSILLECTOMY     1982/1992  . VAGINAL HYSTERECTOMY  2003   Vag Hyst,BSO,Sling, posterior repair  . VULVA SURGERY     keratoses  . WRIST SURGERY  1988   left, tendon repair   Social History   Occupational History  . Not on file.   Social History Main Topics  . Smoking status: Current Every Day Smoker    Packs/day: 0.50    Years: 30.00    Types: Cigarettes  . Smokeless tobacco: Never Used     Comment: Smoked 1968-present; up to 1 ppd  . Alcohol use No  . Drug use: No  . Sexual activity: No     Comment: 1st intercourse 66 yo-Fewer than 5 partners

## 2016-03-07 ENCOUNTER — Ambulatory Visit (INDEPENDENT_AMBULATORY_CARE_PROVIDER_SITE_OTHER): Payer: Medicare Other | Admitting: Pulmonary Disease

## 2016-03-07 ENCOUNTER — Encounter: Payer: Self-pay | Admitting: Pulmonary Disease

## 2016-03-07 VITALS — BP 126/72 | HR 72 | Ht 65.0 in | Wt 193.6 lb

## 2016-03-07 DIAGNOSIS — R05 Cough: Secondary | ICD-10-CM | POA: Diagnosis not present

## 2016-03-07 DIAGNOSIS — R059 Cough, unspecified: Secondary | ICD-10-CM

## 2016-03-07 LAB — NITRIC OXIDE: NITRIC OXIDE: 12

## 2016-03-07 NOTE — Patient Instructions (Addendum)
Please call us back if your cough is not improving for a revisit. Follow up as needed.

## 2016-03-07 NOTE — Progress Notes (Signed)
Alexa Buchanan    [id number]    February 06, 1951  Primary Care Physician:HOLWERDA, Lorin Picket, MD  Referring Physician: Alysia Penna, MD [address] [place], [place] [address]  Chief complaint:  Chronic cough  HPI: [name] is a 66 year old with past history of arthritis, breast cancer, depression, diabetes, diverticulosis. She has an episode of cold and respiratory tract infection in number. This was treated with Hydromet cough syrup, Z-Pak which improved her symptoms. She had a travel to [facility] in December with recurrence of cough. Her chief complaints are nonproductive cough. She denies any dyspnea, sputum production, wheezing, fevers, chills. She has been given Symbicort and albuterol by primary care physician which has helped with her symptoms. She is no longer using these inhalers. Has any seasonal allergies, postnasal drip. She has occasional acid reflux for which she uses over-the-counter medication. Overall she feels that her symptoms are slowly improving.  She had a sleep study in 2013 which showed mild sleep apnea with AHI of 8.3. She declined CPAP. She had a subsequent sleep study at a different facility which did not show sleep apnea as per the patient. 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  She is a half pack per day smoker for over 30 years. 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  Outpatient Encounter Prescriptions as of 03/07/2016  Medication Sig  . aspirin EC 81 MG tablet Take 81 mg by mouth daily.  . Calcium Carbonate-Vitamin D (CALCIUM + D PO) Take 1 tablet by mouth 2 (two) times daily.   . cetirizine (ZYRTEC) 10 MG tablet Take 10 mg by mouth daily.  . Cholecalciferol (VITAMIN D3) 2000 UNITS TABS Take 2,000 Units by mouth 2 (two) times daily.   Marland Kitchen gabapentin (NEURONTIN) 100 MG capsule Take 100 mg by mouth.  Marland Kitchen  glucose blood (ONE TOUCH ULTRA TEST) test strip CHECK BLOOD SUGAR TWICE A DAY AS DIRECTED.  Demetra Shiner Device MISC One Touch Delica, test once daily  . Liraglutide (VICTOZA) 18 MG/3ML SOPN Inject 1.8 MG Subcutaneously each day.--- must have office visit and labs before further refills. (Patient taking differently: Inject 7.2 mg into the skin daily. )  . metFORMIN (GLUCOPHAGE-XR) 500 MG 24 hr tablet TAKE 1 TABLET ONCE DAILY.  . Multiple Vitamin (MULTIVITAMIN) capsule Take 1 capsule by mouth daily. Centrum Silver.  [name] DELICA LANCETS 33G MISC CHECK BLOOD SUGAR ONCE A DAY.  Marland Kitchen oxybutynin (DITROPAN-XL) 10 MG 24 hr tablet Take 15 mg by mouth Daily.   Marland Kitchen trimethoprim (TRIMPEX) 100 MG tablet Take 100 mg by mouth 2 (two) times daily.  Marland Kitchen zolpidem (AMBIEN) 10 MG tablet Take 10 mg by mouth at bedtime as needed for sleep.    Facility-Administered Encounter Medications as of 03/07/2016  Medication  . pneumococcal 23 valent vaccine (PNU-IMMUNE) injection 0.5 mL    Allergies as of 03/07/2016  . (No Known Allergies)    Past Medical History:  Diagnosis Date  . Arthritis   . Cancer (HCC)    Breast  . Complication of anesthesia    woke up to soon.  reported "bad gag reflex"     . Depression    pt states she is not depressed  . Diabetes mellitus    Type 2  . Diverticulosis   . Dysmetabolic syndrome X   .  Histoplasmosis 1971  . IBS (irritable bowel syndrome)   . Migraines    ice pick headaches, Dr. Marylou Flesherohmier  . OSA (obstructive sleep apnea) 07/27/2011   Home sleep study 08/18/11 >> AHI 8.1, SpO2 low 74%.  pt stated she does not have sleep apnes  . Seasonal allergies   . UTI symptoms    Past Surgical History:  Procedure Laterality Date  . BREAST LUMPECTOMY WITH RADIOACTIVE SEED LOCALIZATION Right 07/22/2015   Procedure: BREAST LUMPECTOMY WITH RADIOACTIVE SEED LOCALIZATION;  Surgeon: Abigail Miyamotoouglas Blackman, MD;  Location: [facility] OR;  Service: General;  Laterality: Right;  . COLONOSCOPY     Tics; Dr Juanda ChanceBrodie    . DILATION AND CURETTAGE OF UTERUS    . Gastric Plication  1981  . KNEE ARTHROSCOPY  1990  . LASER ABLATION OF VASCULAR LESION  2009   Dr. Dorma RussellKraus  . NOSE SURGERY    . RECTOCELE REPAIR  2003   with vaginal wall repair and sling  . ROTATOR CUFF REPAIR  2016  . TONSILLECTOMY     1982/1992  . VAGINAL HYSTERECTOMY  2003   Vag Hyst,BSO,Sling, posterior repair  . VULVA SURGERY     keratoses  . WRIST SURGERY  1988   left, tendon repair   Family History  Problem Relation Age of Onset  . Leukemia Father     CLL  . Hyperlipidemia Mother   . Multiple sclerosis Mother   . Hypertension Mother   . Diabetes Maternal Grandmother     also had malignant lower neck mass of unknown etiology  . Breast cancer Sister     age 68  . Hypertension Sister     related to migraines  . Migraines Sister   . Stroke Maternal Grandfather [phone]  . Coronary artery disease Maternal Uncle   . Uterine cancer Paternal Aunt   . Heart disease Maternal Aunt     porcine valve  . Heart disease Maternal Uncle     valvular disease   Social History   Social History  . Marital status: Married    Spouse name: N/A  . Number of children: N/A  . Years of education: N/A   Occupational History  . Not on file.   Social History Main Topics  . Smoking status: Current Every Day Smoker    Packs/day: 0.50    Years: 30.00    Types: Cigarettes  . Smokeless tobacco: Never Used     Comment: Smoked 1968-present; up to 1 ppd  . Alcohol use No  . Drug use: No  . Sexual activity: No     Comment: 1st intercourse 66 yo-Fewer than 5 partners   Other Topics Concern  . 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  HENT: Negative.   Eyes: Negative for  blurred vision.  Respiratory: as per HPI  Cardiovascular: Negative for chest pain and palpitations.  Gastrointestinal: Negative for vomiting, diarrhea, blood per rectum. Genitourinary: Negative for dysuria, urgency, frequency and hematuria.  Musculoskeletal: Negative for myalgias, back pain and joint pain.  Skin: Negative for itching and rash.  /Neurological: Negative for dizziness, tremors, focal weakness, seizures and loss of consciousness.  Endo/Heme/Allergies: Negative for environmental allergies.  Psychiatric/Behavioral:  Negative for depression, suicidal ideas and hallucinations.  All other systems reviewed and are negative.  Physical Exam: Blood pressure 126/72, pulse 72, height 5\' 5"  (1.651 m), weight 193 lb 9.6 oz (87.8 kg), last menstrual period 02/28/1995, SpO2 98 %. Gen:      No acute distress HEENT:  EOMI, sclera anicteric Neck:     No masses; no thyromegaly Lungs:    Clear to auscultation bilaterally; normal respiratory effort CV:         Regular rate and rhythm; no murmurs Abd:      + bowel sounds; soft, non-tender; no palpable masses, no distension Ext:    No edema; adequate peripheral perfusion Skin:      Warm and dry; no rash Neuro: alert and oriented x 3 Psych: normal mood and affect  Data Reviewed: FENO 03/08/15- 12  Home sleep study from 08/18/11 >> AHI 8.1, SpO2 low 74%.  CT scan 02/25/15- no consolidation or effusion. 1.6 cm calcified granuloma over the right lower lobe, 2 mm calcified granuloma over right lower lobe. Multiple small calcified granuloma and liver and spleen. All images personally reviewed.  Assessment:  Chronic cough This is likely postinfectious from recent bout of bronchitis. She feels that she is slowly improving and does not want any interventions. She has stopped the Symbicort and albuterol. I discussed getting pulmonary function tests given her significant smoking history but she has declined at this moment. She'll call us back if the  symptoms do not improve for further evaluation.  Active smoker She continues to smoke actively daily. I discussed smoking cessation, but she feels she can do this under her own. Time spent counseling-5 minutes She is a candidate for low-dose screening CTs of the program but she prefers to follow up with this with her primary care physician.    OSA Sleep study in 2013 showed very mild sleep apnea. She's had a repeat sleep study that did not show any apnea as per the patient. She is not on any therapy currently.  Plan/Recommendations: Return as needed.  Chilton Greathouse MD [facility] Pager [phone] 03/07/2016, 12:20 PM  CC: Alysia Penna, MD

## 2016-03-13 DIAGNOSIS — E1140 Type 2 diabetes mellitus with diabetic neuropathy, unspecified: Secondary | ICD-10-CM | POA: Diagnosis not present

## 2016-03-14 DIAGNOSIS — M25512 Pain in left shoulder: Secondary | ICD-10-CM | POA: Diagnosis not present

## 2016-03-14 DIAGNOSIS — M25511 Pain in right shoulder: Secondary | ICD-10-CM | POA: Diagnosis not present

## 2016-03-20 DIAGNOSIS — R918 Other nonspecific abnormal finding of lung field: Secondary | ICD-10-CM | POA: Diagnosis not present

## 2016-03-20 DIAGNOSIS — F418 Other specified anxiety disorders: Secondary | ICD-10-CM | POA: Diagnosis not present

## 2016-03-20 DIAGNOSIS — E1140 Type 2 diabetes mellitus with diabetic neuropathy, unspecified: Secondary | ICD-10-CM | POA: Diagnosis not present

## 2016-03-20 DIAGNOSIS — N6089 Other benign mammary dysplasias of unspecified breast: Secondary | ICD-10-CM | POA: Diagnosis not present

## 2016-03-20 DIAGNOSIS — R748 Abnormal levels of other serum enzymes: Secondary | ICD-10-CM | POA: Diagnosis not present

## 2016-03-20 DIAGNOSIS — N390 Urinary tract infection, site not specified: Secondary | ICD-10-CM | POA: Diagnosis not present

## 2016-03-20 DIAGNOSIS — Z0000 Encounter for general adult medical examination without abnormal findings: Secondary | ICD-10-CM | POA: Diagnosis not present

## 2016-03-20 DIAGNOSIS — E119 Type 2 diabetes mellitus without complications: Secondary | ICD-10-CM | POA: Diagnosis not present

## 2016-03-20 DIAGNOSIS — Z6832 Body mass index (BMI) 32.0-32.9, adult: Secondary | ICD-10-CM | POA: Diagnosis not present

## 2016-03-20 DIAGNOSIS — M25519 Pain in unspecified shoulder: Secondary | ICD-10-CM | POA: Diagnosis not present

## 2016-03-22 ENCOUNTER — Other Ambulatory Visit: Payer: Self-pay | Admitting: Internal Medicine

## 2016-03-22 ENCOUNTER — Ambulatory Visit
Admission: RE | Admit: 2016-03-22 | Discharge: 2016-03-22 | Disposition: A | Discharge status: Home / Self Care | Payer: Medicare Other | Source: Ambulatory Visit | Attending: Internal Medicine | Admitting: Internal Medicine

## 2016-03-22 DIAGNOSIS — R748 Abnormal levels of other serum enzymes: Secondary | ICD-10-CM

## 2016-03-22 DIAGNOSIS — M25511 Pain in right shoulder: Secondary | ICD-10-CM | POA: Diagnosis not present

## 2016-03-22 DIAGNOSIS — M25512 Pain in left shoulder: Secondary | ICD-10-CM | POA: Diagnosis not present

## 2016-03-22 DIAGNOSIS — K760 Fatty (change of) liver, not elsewhere classified: Secondary | ICD-10-CM | POA: Diagnosis not present

## 2016-03-22 MED ORDER — IOPAMIDOL (ISOVUE-300) INJECTION 61%
100.0000 mL | Freq: Once | INTRAVENOUS | Status: AC | PRN
  Administered 2016-03-22: 100 mL via INTRAVENOUS

## 2016-03-23 ENCOUNTER — Encounter: Payer: Self-pay | Admitting: Gastroenterology

## 2016-04-04 DIAGNOSIS — R3915 Urgency of urination: Secondary | ICD-10-CM | POA: Diagnosis not present

## 2016-04-04 DIAGNOSIS — N3941 Urge incontinence: Secondary | ICD-10-CM | POA: Diagnosis not present

## 2016-04-04 DIAGNOSIS — Z87440 Personal history of urinary (tract) infections: Secondary | ICD-10-CM | POA: Diagnosis not present

## 2016-04-04 DIAGNOSIS — R350 Frequency of micturition: Secondary | ICD-10-CM | POA: Diagnosis not present

## 2016-04-14 ENCOUNTER — Encounter (INDEPENDENT_AMBULATORY_CARE_PROVIDER_SITE_OTHER): Payer: Self-pay | Admitting: Orthopaedic Surgery

## 2016-04-14 ENCOUNTER — Ambulatory Visit (INDEPENDENT_AMBULATORY_CARE_PROVIDER_SITE_OTHER): Payer: Medicare Other | Admitting: Orthopaedic Surgery

## 2016-04-14 VITALS — BP 128/65 | HR 85 | Ht 64.0 in | Wt 185.0 lb

## 2016-04-14 DIAGNOSIS — G8929 Other chronic pain: Secondary | ICD-10-CM

## 2016-04-14 DIAGNOSIS — M25512 Pain in left shoulder: Secondary | ICD-10-CM

## 2016-04-14 NOTE — Progress Notes (Signed)
Office Visit Note   Patient: Alexa Buchanan           Date of Birth: 10/06/1950           MRN: [id number] Visit Date: 04/14/2016              Requested by: Alysia PennaScott Holwerda, MD [address] [place], [place] [address] PCP: Alysia PennaHOLWERDA, SCOTT, MD   Assessment & Plan: Visit Diagnoses: Mild bilateral adhesive capsulitis both shoulders  Plan: Continue with home exercise program per physical therapy. Follow up as needed Mrs Doroteo Glassmanolinsky is relatively comfortable at present with an exercise routine . no further diagnostic studies necessary at this time as she is doing well without functional loss. Follow-Up Instructions: No Follow-up on file.   Orders:  No orders of the defined types were placed in this encounter.  No orders of the defined types were placed in this encounter.     Procedures: No procedures performed   Clinical Data: No additional findings.   Subjective: No chief complaint on file.   Pt presents with follow up of left shoulder pain. Her bilateral arm pain appears to be referred from shoulders-early adhesive capsulitis  [facility]. Pt seems to be helping eith her ROM and she would like to get another visit.     Review of Systems   Objective: Vital Signs: LMP 02/28/1995   Physical Exam  Ortho Exam negative impingement and empty can testing both shoulders. Some loss of external rotation bilaterally with pain on rotation beyond about 20 on the right and about 30 on the left. No crepitation. No localized areas of tenderness. Biceps intact. Good grip and release. No neck pain or referred discomfort from cervical spine  Specialty Comments:  No specialty comments available.  Imaging: No results found.   PMFS History: Patient Active Problem List   Diagnosis Date Noted  . Acute bronchitis 02/10/2014  . Smoker 11/12/2013  . Type II or unspecified type diabetes mellitus without mention of complication,  uncontrolled 12/03/2012  . Elevated blood pressure reading without diagnosis of hypertension 09/04/2012  . OSA (obstructive sleep apnea) 07/27/2011  . Insomnia 07/27/2011  . Wheezing 07/27/2011  . Unspecified adverse effect of unspecified drug, medicinal and biological substance 07/04/2011  . Vitamin D deficiency 12/29/2010  . CERVICAL RADICULOPATHY, RIGHT 04/21/2010  . 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  . POSTMENOPAUSAL SYNDROME 06/03/2009  . DIVERTICULOSIS, COLON 09/14/2008  . DYSMETABOLIC SYNDROME X 09/10/2007  . IBS 09/10/2007  . 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 DEPRESSION 02/04/2007  . MIGRAINE HEADACHE 02/04/2007  . STRESS INCONTINENCE 02/04/2007  . HISTOPLASMOSIS, HX OF 02/04/2007  . LIVER FUNCTION TESTS, ABNORMAL, HX OF 02/04/2007  . COLITIS, HX OF 02/04/2007   Past Medical History:  Diagnosis Date  . Arthritis   . Cancer (HCC)    Breast  . Complication of anesthesia    woke up to soon.  reported "bad gag reflex"     . Depression    pt states she is not depressed  . Diabetes mellitus    Type 2  . Diverticulosis   . Dysmetabolic syndrome X   . Histoplasmosis 1971  . IBS (irritable bowel syndrome)   . Migraines    ice pick headaches, Dr. Marylou Flesherohmier  . OSA (obstructive sleep apnea) 07/27/2011   Home sleep study 08/18/11 >> AHI 8.1, SpO2 low 74%.  pt stated she does not have sleep apnes  . Seasonal allergies   . UTI symptoms  Family History  Problem Relation Age of Onset  . Leukemia Father     CLL  . Hyperlipidemia Mother   . Multiple sclerosis Mother   . Hypertension Mother   . Diabetes Maternal Grandmother     also had malignant lower neck mass of unknown etiology  . Breast cancer Sister     age 50  . Hypertension Sister     related to migraines  . Migraines Sister   . Stroke Maternal Grandfather 8  . Coronary artery disease Maternal Uncle   . Uterine cancer Paternal Aunt   . Heart disease Maternal Aunt      porcine valve  . Heart disease Maternal Uncle     valvular disease    Past Surgical History:  Procedure Laterality Date  . BREAST LUMPECTOMY WITH RADIOACTIVE SEED LOCALIZATION Right 07/22/2015   Procedure: BREAST LUMPECTOMY WITH RADIOACTIVE SEED LOCALIZATION;  Surgeon: Abigail Miyamoto, MD;  Location: [facility] OR;  Service: General;  Laterality: Right;  . COLONOSCOPY     Tics; Dr Juanda Chance  . DILATION AND CURETTAGE OF UTERUS    . Gastric Plication  1981  . KNEE ARTHROSCOPY  1990  . LASER ABLATION OF VASCULAR LESION  2009   Dr. Dorma Russell  . NOSE SURGERY    . RECTOCELE REPAIR  2003   with vaginal wall repair and sling  . ROTATOR CUFF REPAIR  2016  . TONSILLECTOMY     1982/1992  . VAGINAL HYSTERECTOMY  2003   Vag Hyst,BSO,Sling, posterior repair  . VULVA SURGERY     keratoses  . WRIST SURGERY  1988   left, tendon repair   Social History   Occupational History  . Not on file.   Social History Main Topics  . Smoking status: Current Every Day Smoker    Packs/day: 0.50    Years: 30.00    Types: Cigarettes  . Smokeless tobacco: Never Used     Comment: Smoked 1968-present; up to 1 ppd  . Alcohol use No  . Drug use: No  . Sexual activity: No     Comment: 1st intercourse 66 yo-Fewer than 5 partners

## 2016-04-27 ENCOUNTER — Other Ambulatory Visit (INDEPENDENT_AMBULATORY_CARE_PROVIDER_SITE_OTHER): Payer: Medicare Other

## 2016-04-27 ENCOUNTER — Ambulatory Visit (INDEPENDENT_AMBULATORY_CARE_PROVIDER_SITE_OTHER): Payer: Medicare Other | Admitting: Gastroenterology

## 2016-04-27 ENCOUNTER — Encounter: Payer: Self-pay | Admitting: Gastroenterology

## 2016-04-27 VITALS — BP 146/74 | HR 80 | Ht 63.0 in | Wt 195.1 lb

## 2016-04-27 DIAGNOSIS — R7989 Other specified abnormal findings of blood chemistry: Secondary | ICD-10-CM

## 2016-04-27 DIAGNOSIS — R945 Abnormal results of liver function studies: Principal | ICD-10-CM

## 2016-04-27 LAB — HEPATIC FUNCTION PANEL
ALT: 68 U/L — AB (ref 0–35)
AST: 32 U/L (ref 0–37)
Albumin: 4.4 g/dL (ref 3.5–5.2)
Alkaline Phosphatase: 215 U/L — ABNORMAL HIGH (ref 39–117)
BILIRUBIN DIRECT: 0.1 mg/dL (ref 0.0–0.3)
BILIRUBIN TOTAL: 0.6 mg/dL (ref 0.2–1.2)
Total Protein: 7.8 g/dL (ref 6.0–8.3)

## 2016-04-27 LAB — GAMMA GT: GGT: 596 U/L — ABNORMAL HIGH (ref 7–51)

## 2016-04-27 LAB — IBC PANEL
Iron: 84 ug/dL (ref 42–145)
Saturation Ratios: 23.6 % (ref 20.0–50.0)
Transferrin: 254 mg/dL (ref 212.0–360.0)

## 2016-04-27 LAB — FERRITIN: Ferritin: 443.6 ng/mL — ABNORMAL HIGH (ref 10.0–291.0)

## 2016-04-27 NOTE — Patient Instructions (Signed)
If you are age 68 or older, your body mass index should be between 23-30. Your Body mass index is 34.56 kg/m. If this is out of the aforementioned range listed, please consider follow up with your Primary Care Provider.  If you are age 68 or younger, your body mass index should be between 19-25. Your Body mass index is 34.56 kg/m. If this is out of the aformentioned range listed, please consider follow up with your Primary Care Provider.   Thank you for choosing [facility]  Dr Amada JupiterHenry Danis III

## 2016-04-27 NOTE — Progress Notes (Signed)
[facility] Consult Note:  History: Alexa Buchanan 04/27/2016  Referring physician: Alysia PennaHOLWERDA, SCOTT, MD  Reason for consult/chief complaint: elevated LFT's and abnormal CT (hepatic steatosis)   Subjective  HPI:  This is a 66 year old woman referred by primary care for elevated LFTs. Within the last few months she was noted to have an elevated alkaline phosphatase, and it had gone up further on a January visit with her PCP. There office note indicates that a GGT was planned, but if it was done those results were not forwarded to us. She was told she needed urgent imaging after that, and there was the possibility of malignancy. She has been very concerned about her diagnosis ever since then. She denies a personal or family history of liver disease, she does not drink alcohol regularly. Darl PikesSusan denies abdominal pain, nausea, vomiting, jaundice, itching or acholic stools.   ROS:  Review of Systems  Constitutional: Negative for appetite change and unexpected weight change.  HENT: Negative for mouth sores and voice change.   Eyes: Negative for pain and redness.  Respiratory: Negative for cough and shortness of breath.   Cardiovascular: Negative for chest pain and palpitations.  Genitourinary: Negative for dysuria and hematuria.  Musculoskeletal: Negative for arthralgias and myalgias.  Skin: Negative for pallor and rash.  Neurological: Negative for weakness and headaches.  Hematological: Negative for adenopathy.     Past Medical History: Past Medical History:  Diagnosis Date  . Arthritis   . Complication of anesthesia    woke up to soon.  reported "bad gag reflex"     . Diabetes mellitus    Type 2  . Diverticulosis   . Dysmetabolic syndrome X   . Histoplasmosis 1971  . IBS (irritable bowel syndrome)   . Migraines    ice pick headaches, Dr. Marylou Flesherohmier  . OSA (obstructive sleep apnea) 07/27/2011   Home sleep study 08/18/11 >> AHI 8.1, SpO2 low 74%.  pt stated she does not  have sleep apnes  . Seasonal allergies   . UTI symptoms      Past Surgical History: Past Surgical History:  Procedure Laterality Date  . BREAST LUMPECTOMY WITH RADIOACTIVE SEED LOCALIZATION Right 07/22/2015   Procedure: BREAST LUMPECTOMY WITH RADIOACTIVE SEED LOCALIZATION;  Surgeon: Abigail Miyamotoouglas Blackman, MD;  Location: [facility] OR;  Service: General;  Laterality: Right;  . COLONOSCOPY     Tics; Dr Juanda ChanceBrodie  . DILATION AND CURETTAGE OF UTERUS    . Gastric Plication  1981  . KNEE ARTHROSCOPY Left 1990  . LASER ABLATION OF VASCULAR LESION  2009   Dr. Dorma RussellKraus, wart removed from nose  . RECTOCELE REPAIR  2003   with vaginal wall repair and sling  . ROTATOR CUFF REPAIR Right 2016  . TONSILLECTOMY     1982/1992  . VAGINAL HYSTERECTOMY  2003   Vag Hyst,BSO,Sling, posterior repair  . VULVA SURGERY     keratoses  . WRIST SURGERY Left 1988    tendon repair     Family History: Family History  Problem Relation Age of Onset  . Leukemia Father     CLL  . Hyperlipidemia Mother   . Multiple sclerosis Mother   . Hypertension Mother   . Diabetes Maternal Grandmother     also had malignant lower neck mass of unknown etiology  . Breast cancer Sister     age 68  . Hypertension Sister     related to migraines  . Migraines Sister   . Stroke Maternal Grandfather [id number]  . Coronary artery  disease Maternal Uncle   . Heart disease Maternal Aunt     porcine valve  . Heart disease Maternal Uncle     valvular disease    Social History: Social History   Social History  . Marital status: Married    Spouse name: N/A  . Number of children: 2  . Years of education: N/A   Occupational History  . Orthoptist    Social History Main Topics  . Smoking status: Current Every Day Smoker    Packs/day: 0.50    Years: 30.00    Types: Cigarettes  . Smokeless tobacco: Never Used     Comment: Smoked 1968-present; up to 1 ppd  . Alcohol use No  . Drug use: No  . Sexual activity: No     Comment: 1st intercourse  66 yo-Fewer than 5 partners   Other Topics Concern  . 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 aspirin EC 81 MG tablet Take 81 mg by mouth daily.    . Calcium Carbonate-Vitamin D (CALCIUM + D PO) Take 1 tablet by mouth 2 (two) times daily.     . cetirizine (ZYRTEC) 10 MG tablet Take 10 mg by mouth daily.    . Cholecalciferol (VITAMIN D3) 2000 UNITS TABS Take 2,000 Units by mouth 2 (two) times daily.     Marland Kitchen gabapentin (NEURONTIN) 100 MG capsule Take 100 mg by mouth 2 (two) times daily.     Marland Kitchen glucose blood (ONE TOUCH ULTRA TEST) test strip CHECK BLOOD SUGAR TWICE A DAY AS DIRECTED. 50 each 6  . Lancet Device MISC One Touch [place], test once daily 1 each 2  . liraglutide (VICTOZA) 18 MG/3ML SOPN Inject 1.2 mg into the skin daily.    . metFORMIN (GLUCOPHAGE-XR) 500 MG 24 hr tablet TAKE 1 TABLET ONCE DAILY. 90 tablet 1  . Multiple Vitamin (MULTIVITAMIN) capsule Take 1 capsule by mouth daily. Centrum Silver.    [name] DELICA LANCETS 33G MISC CHECK BLOOD SUGAR ONCE A DAY. 100 each 3  . oxybutynin (DITROPAN XL) 15 MG 24 hr tablet TAKE 1 TABLET DAILY.    Marland Kitchen trimethoprim (TRIMPEX) 100 MG tablet Take 100 mg by mouth as needed.     . zolpidem (AMBIEN) 10 MG tablet Take 5 mg by mouth as needed for sleep.      Current Facility-Administered Medications  Medication Dose Route Frequency Provider Last Rate Last Dose  . pneumococcal 23 valent vaccine (PNU-IMMUNE) injection 0.5 mL  0.5 mL Intramuscular Tomorrow-1000 Pecola Lawless, MD          ___________________________________________________________________ Objective   Exam:  BP (!) 146/74 (BP Location: Left Arm, Patient Position: Sitting,  Cuff Size: Normal)   Pulse 80   Ht 5\' 3"  (1.6 m) Comment: height measured without shoes  Wt 195 lb 2 oz (88.5 kg)   LMP 02/28/1995   BMI 34.56 kg/m    General: this is a(n) Obese woman in no acute distress. She is alert, pleasant and conversational.   Eyes: sclera anicteric, no redness  ENT: oral mucosa moist without lesions, no cervical or supraclavicular lymphadenopathy, good dentition  CV: RRR without murmur, S1/S2, no  JVD, no peripheral edema  Resp: clear to auscultation bilaterally, normal RR and effort noted  GI: soft, no tenderness, with active bowel sounds. No guarding or palpable organomegaly noted.  Skin; warm and dry, no rash or jaundice noted  Neuro: awake, alert and oriented x 3. Normal gross motor function and fluent speech  Labs:  From 03/12/2015: AST 24 ALT 48: Phosphatase 138 total bilirubin 0.4  From 03/13/2016: AST 28 ALT 46 alkaline phosphatase 201 total bilirubin 0.5 WBC 9 hemoglobin 14 platelets 362  TSH normal  From 03/20/2016: AST 25 ALT 53 alkaline phosphatase 218 total bilirubin 0.5     Radiologic Studies:  CT abdomen and pelvis 03/22/2016 was personally reviewed and the report is noted in Epic. She has a diffusely heterogeneous echotexture consistent with imaging in 2016. There is no extra or intrahepatic biliary ductal dilatation. There are a few punctate calcifications of unlikely clinical significance within the hepatic parenchyma.  Assessment: Encounter Diagnosis  Name Primary?  . Elevated liver function tests Yes    Assuming her GGT is elevated when rechecked today, this LFT pattern is most consistent with intrinsic liver condition. The differential is broad, including infiltrative disease, autoimmune and metabolic liver disease, does not appear to be medication effect from anything she is on, and it would be an unusual but possible finding from severe NASH.  Plan:  LFTs today, GGT, a panel of metabolic and autoimmune liver labs. I  told her that based on the results, next step might either be MRCP or more likely liver biopsy. I don't think she is in any immediate danger from this and she is reassured.   Thank you for the courtesy of this consult.  Please call me with any questions or concerns.  Charlie Pitter III  CC: Alysia Penna, MD

## 2016-04-28 LAB — ANTI-SMOOTH MUSCLE ANTIBODY, IGG: Smooth Muscle Ab: <20 U (ref <20)

## 2016-04-28 LAB — ANA W/REFLEX: Anti Nuclear Antibody(ANA): NEGATIVE

## 2016-05-01 LAB — ALPHA-1-ANTITRYPSIN: A1 ANTITRYPSIN SER: 153 mg/dL (ref 83–199)

## 2016-05-01 LAB — CERULOPLASMIN: Ceruloplasmin: 38 mg/dL (ref 18–53)

## 2016-05-01 LAB — MITOCHONDRIAL ANTIBODIES: MITOCHONDRIAL M2 AB, IGG: 130 U — AB (ref <=20.0)

## 2016-05-02 ENCOUNTER — Telehealth: Payer: Self-pay | Admitting: Gastroenterology

## 2016-05-02 NOTE — Telephone Encounter (Signed)
I spoke with Alexa Buchanan about her lab results and that her tests strongly suggest PBC.  She also knows I need a little additional lab work and we will schedule a liver biopsy.  Labs: PT/INR Vitamin A, E, 1-25 Vit D  Liver biopsy by interventional radiology next available (hopefully within a couple weeks). Mon, Tue and Fri most weeks are best for her. She is going out of town later this week for a few days.  Please coordinate labs and biopsy with her.  When we have a date for the biopsy, please schedule her to see me in clinic 5-7 working days later so biopsy result will be available.

## 2016-05-03 ENCOUNTER — Other Ambulatory Visit: Payer: Self-pay

## 2016-05-03 DIAGNOSIS — R945 Abnormal results of liver function studies: Principal | ICD-10-CM

## 2016-05-03 DIAGNOSIS — R7989 Other specified abnormal findings of blood chemistry: Secondary | ICD-10-CM

## 2016-05-03 DIAGNOSIS — E559 Vitamin D deficiency, unspecified: Secondary | ICD-10-CM

## 2016-05-04 ENCOUNTER — Other Ambulatory Visit (INDEPENDENT_AMBULATORY_CARE_PROVIDER_SITE_OTHER): Payer: Medicare Other

## 2016-05-04 DIAGNOSIS — R7989 Other specified abnormal findings of blood chemistry: Secondary | ICD-10-CM | POA: Diagnosis not present

## 2016-05-04 DIAGNOSIS — E559 Vitamin D deficiency, unspecified: Secondary | ICD-10-CM | POA: Diagnosis not present

## 2016-05-04 LAB — PROTIME-INR
INR: 1 ratio (ref 0.8–1.0)
Prothrombin Time: 10.2 s (ref 9.6–13.1)

## 2016-05-07 LAB — VITAMIN D 1,25 DIHYDROXY
VITAMIN D3 1, 25 (OH): 68 pg/mL
Vitamin D 1, 25 (OH)2 Total: 68 pg/mL (ref 18–72)

## 2016-05-08 ENCOUNTER — Telehealth: Payer: Self-pay

## 2016-05-08 LAB — VITAMIN A

## 2016-05-08 LAB — VITAMIN E

## 2016-05-08 NOTE — Telephone Encounter (Signed)
Alexa Buchanan with [facility] called with lab review for several patients.  For this patient: He stated that the Vitamin A and Vitamin E labs were not able to be resulted due to collection procedure.   Vitamin A lab was not received in a frozen vial and the Vitamin E was not collected in either an amber or red top tube and protected from the light.   Forwarding to the ordering provider and the Lab Manager.

## 2016-05-08 NOTE — Telephone Encounter (Signed)
They should re draw (not urgently), call and apologize to patient for mistake.

## 2016-05-09 NOTE — Telephone Encounter (Signed)
Forwarding to Winn-DixieCynthia (The Personal assistantlab manager)

## 2016-05-11 ENCOUNTER — Other Ambulatory Visit: Payer: Self-pay | Admitting: Radiology

## 2016-05-11 ENCOUNTER — Other Ambulatory Visit: Payer: Self-pay | Admitting: General Surgery

## 2016-05-12 ENCOUNTER — Other Ambulatory Visit: Payer: Self-pay

## 2016-05-12 ENCOUNTER — Telehealth: Payer: Self-pay

## 2016-05-12 ENCOUNTER — Telehealth: Payer: Self-pay | Admitting: Gastroenterology

## 2016-05-12 ENCOUNTER — Encounter (HOSPITAL_COMMUNITY): Payer: Self-pay

## 2016-05-12 ENCOUNTER — Ambulatory Visit (HOSPITAL_COMMUNITY)
Admission: RE | Admit: 2016-05-12 | Discharge: 2016-05-12 | Disposition: A | Discharge status: Home / Self Care | Payer: Medicare Other | Source: Ambulatory Visit | Attending: Gastroenterology | Admitting: Gastroenterology

## 2016-05-12 ENCOUNTER — Other Ambulatory Visit: Payer: Medicare Other

## 2016-05-12 DIAGNOSIS — R7989 Other specified abnormal findings of blood chemistry: Secondary | ICD-10-CM | POA: Diagnosis present

## 2016-05-12 DIAGNOSIS — K589 Irritable bowel syndrome without diarrhea: Secondary | ICD-10-CM | POA: Diagnosis not present

## 2016-05-12 DIAGNOSIS — Z7984 Long term (current) use of oral hypoglycemic drugs: Secondary | ICD-10-CM | POA: Diagnosis not present

## 2016-05-12 DIAGNOSIS — E119 Type 2 diabetes mellitus without complications: Secondary | ICD-10-CM | POA: Diagnosis not present

## 2016-05-12 DIAGNOSIS — Z7951 Long term (current) use of inhaled steroids: Secondary | ICD-10-CM | POA: Insufficient documentation

## 2016-05-12 DIAGNOSIS — Z8249 Family history of ischemic heart disease and other diseases of the circulatory system: Secondary | ICD-10-CM | POA: Diagnosis not present

## 2016-05-12 DIAGNOSIS — Z79899 Other long term (current) drug therapy: Secondary | ICD-10-CM | POA: Insufficient documentation

## 2016-05-12 DIAGNOSIS — K739 Chronic hepatitis, unspecified: Secondary | ICD-10-CM | POA: Insufficient documentation

## 2016-05-12 DIAGNOSIS — E559 Vitamin D deficiency, unspecified: Secondary | ICD-10-CM | POA: Diagnosis not present

## 2016-05-12 DIAGNOSIS — Z806 Family history of leukemia: Secondary | ICD-10-CM | POA: Insufficient documentation

## 2016-05-12 DIAGNOSIS — K830 Cholangitis: Secondary | ICD-10-CM | POA: Insufficient documentation

## 2016-05-12 DIAGNOSIS — Z7982 Long term (current) use of aspirin: Secondary | ICD-10-CM | POA: Insufficient documentation

## 2016-05-12 DIAGNOSIS — G4733 Obstructive sleep apnea (adult) (pediatric): Secondary | ICD-10-CM | POA: Insufficient documentation

## 2016-05-12 DIAGNOSIS — K732 Chronic active hepatitis, not elsewhere classified: Secondary | ICD-10-CM | POA: Diagnosis not present

## 2016-05-12 DIAGNOSIS — F17210 Nicotine dependence, cigarettes, uncomplicated: Secondary | ICD-10-CM | POA: Diagnosis not present

## 2016-05-12 DIAGNOSIS — R740 Nonspecific elevation of levels of transaminase and lactic acid dehydrogenase [LDH]: Secondary | ICD-10-CM | POA: Diagnosis not present

## 2016-05-12 DIAGNOSIS — M1990 Unspecified osteoarthritis, unspecified site: Secondary | ICD-10-CM | POA: Insufficient documentation

## 2016-05-12 DIAGNOSIS — Z803 Family history of malignant neoplasm of breast: Secondary | ICD-10-CM | POA: Insufficient documentation

## 2016-05-12 DIAGNOSIS — R945 Abnormal results of liver function studies: Secondary | ICD-10-CM

## 2016-05-12 LAB — CBC
HCT: 38.4 % (ref 36.0–46.0)
Hemoglobin: 12.7 g/dL (ref 12.0–15.0)
MCH: 30.8 pg (ref 26.0–34.0)
MCHC: 33.1 g/dL (ref 30.0–36.0)
MCV: 93.2 fL (ref 78.0–100.0)
PLATELETS: 328 10*3/uL (ref 150–400)
RBC: 4.12 MIL/uL (ref 3.87–5.11)
RDW: 13.7 % (ref 11.5–15.5)
WBC: 8.5 10*3/uL (ref 4.0–10.5)

## 2016-05-12 LAB — PROTIME-INR
INR: 0.9
PROTHROMBIN TIME: 12.2 s (ref 11.4–15.2)

## 2016-05-12 LAB — APTT: aPTT: 37 seconds — ABNORMAL HIGH (ref 24–36)

## 2016-05-12 LAB — GLUCOSE, CAPILLARY: Glucose-Capillary: 146 mg/dL — ABNORMAL HIGH (ref 65–99)

## 2016-05-12 MED ORDER — FENTANYL CITRATE (PF) 100 MCG/2ML IJ SOLN
INTRAMUSCULAR | Status: AC | PRN
Start: 2016-05-12 — End: 2016-05-12
  Administered 2016-05-12: 50 ug via INTRAVENOUS

## 2016-05-12 MED ORDER — FENTANYL CITRATE (PF) 100 MCG/2ML IJ SOLN
INTRAMUSCULAR | Status: AC
  Filled 2016-05-12: qty 2

## 2016-05-12 MED ORDER — LIDOCAINE HCL 1 % IJ SOLN
INTRAMUSCULAR | Status: AC
  Filled 2016-05-12: qty 20

## 2016-05-12 MED ORDER — TRAMADOL HCL 50 MG PO TABS: 50.0000 mg | ORAL_TABLET | Freq: Four times a day (QID) | ORAL | 0 refills | Status: DC | PRN

## 2016-05-12 MED ORDER — HYDROCODONE-ACETAMINOPHEN 5-325 MG PO TABS
1.0000 | ORAL_TABLET | ORAL | Status: DC | PRN
  Administered 2016-05-12: 1 via ORAL

## 2016-05-12 MED ORDER — MIDAZOLAM HCL 2 MG/2ML IJ SOLN
INTRAMUSCULAR | Status: AC
  Filled 2016-05-12: qty 2

## 2016-05-12 MED ORDER — GELATIN ABSORBABLE 12-7 MM EX MISC
CUTANEOUS | Status: AC
  Filled 2016-05-12: qty 1

## 2016-05-12 MED ORDER — HYDROCODONE-ACETAMINOPHEN 5-325 MG PO TABS
ORAL_TABLET | ORAL | Status: AC
  Administered 2016-05-12: 1 via ORAL
  Filled 2016-05-12: qty 1

## 2016-05-12 MED ORDER — MIDAZOLAM HCL 2 MG/2ML IJ SOLN
INTRAMUSCULAR | Status: AC | PRN
  Administered 2016-05-12 (×3): 1 mg via INTRAVENOUS

## 2016-05-12 MED ORDER — SODIUM CHLORIDE 0.9 % IV SOLN: INTRAVENOUS | Status: DC

## 2016-05-12 NOTE — Telephone Encounter (Signed)
Patient had liver biopsy done today. She is wondering if it's okay to restart her ASA EC 81 mg and metformin today? She also is wanting an Rx for the pain. Let me know, thanks.

## 2016-05-12 NOTE — Telephone Encounter (Signed)
Per email to Lovett Calendertamara, cynthia has contacted patient and patient will return on Monday 3/19 to redo labs

## 2016-05-12 NOTE — Telephone Encounter (Signed)
Alexa Buchanan spoke to Darbydaleynthia and the lab has not contacted the patient yet.

## 2016-05-12 NOTE — H&P (Signed)
Chief Complaint: Patient was seen in consultation today for random liver core biopsy at the request of Alexa Buchanan  Referring Physician(s): Alexa Buchanan  Supervising Physician: Alexa Buchanan  Patient Status: [facility] - Out-[facility]  History of Present Illness: Alexa Buchanan is a 66 y.o. female   Noted elevated liver functions at annual PE in Jan 2018 Follow up labs shows increasing liver functions and Alk Phosphatase rise. CT 1/24: IMPRESSION: 1. Hepatic steatosis. 2. Negative CT appearance of the biliary tree. 3. Remote granulomatous disease. 4. Moderately distended urinary bladder. Changes of cystocele repair.  Follows with GI MD- Dr Loletha Carrow Now scheduled for liver core biopsy  Past Medical History:  Diagnosis Date  . Arthritis   . Complication of anesthesia    woke up to soon.  reported "bad gag reflex"     . Diabetes mellitus    Type 2  . Diverticulosis   . Dysmetabolic syndrome X   . Histoplasmosis 1971  . IBS (irritable bowel syndrome)   . Migraines    ice pick headaches, Dr. Beacher May  . OSA (obstructive sleep apnea) 07/27/2011   Home sleep study 08/18/11 >> AHI 8.1, SpO2 low 74%.  pt stated she does not have sleep apnes  . Seasonal allergies   . UTI symptoms     Past Surgical History:  Procedure Laterality Date  . BREAST LUMPECTOMY WITH RADIOACTIVE SEED LOCALIZATION Right 07/22/2015   Procedure: BREAST LUMPECTOMY WITH RADIOACTIVE SEED LOCALIZATION;  Surgeon: Coralie Keens, MD;  Location: [facility];  Service: General;  Laterality: Right;  . COLONOSCOPY     Tics; Dr Olevia Perches  . DILATION AND CURETTAGE OF UTERUS    . Gastric Plication  2197  . KNEE ARTHROSCOPY Left 1990  . LASER ABLATION OF VASCULAR LESION  2009   Dr. Thornell Mule, wart removed from nose  . RECTOCELE REPAIR  2003   with vaginal wall repair and sling  . ROTATOR CUFF REPAIR Right 2016  . TONSILLECTOMY     1982/1992  . VAGINAL HYSTERECTOMY  2003   Vag Hyst,BSO,Sling, posterior repair  .  VULVA SURGERY     keratoses  . WRIST SURGERY Left 1988    tendon repair    Allergies: Patient has no known allergies.  Medications: Prior to Admission medications   Medication Sig Start Date End Date Taking? Authorizing Provider  albuterol (PROVENTIL HFA;VENTOLIN HFA) 108 (90 Base) MCG/ACT inhaler Inhale 1 puff into the lungs every 4 (four) hours as needed for wheezing or shortness of breath.   Yes Historical Provider, MD  aspirin EC 81 MG tablet Take 81 mg by mouth daily.   Yes Historical Provider, MD  Calcium Carbonate-Vitamin D (CALCIUM + D PO) Take 1 tablet by mouth 2 (two) times daily.    Yes Historical Provider, MD  cetirizine (ZYRTEC) 10 MG tablet Take 10 mg by mouth daily.   Yes Historical Provider, MD  Cholecalciferol (VITAMIN D3) 2000 UNITS TABS Take 2,000 Units by mouth 2 (two) times daily.    Yes Historical Provider, MD  Fluticasone-Salmeterol (ADVAIR) 250-50 MCG/DOSE AEPB Inhale 1 puff into the lungs 2 (two) times daily.   Yes Historical Provider, MD  gabapentin (NEURONTIN) 100 MG capsule Take 100 mg by mouth 2 (two) times daily.  11/10/11  Yes Historical Provider, MD  liraglutide (VICTOZA) 18 MG/3ML SOPN Inject 1.2 mg into the skin daily.   Yes Historical Provider, MD  metFORMIN (GLUCOPHAGE-XR) 500 MG 24 hr tablet TAKE 1 TABLET ONCE DAILY. 07/06/14  Yes  Hendricks Limes, MD  Multiple Vitamin (MULTIVITAMIN) capsule Take 1 capsule by mouth daily. Centrum Silver.   Yes Historical Provider, MD  oxybutynin (DITROPAN XL) 15 MG 24 hr tablet TAKE 1 TABLET DAILY. 04/04/16  Yes Historical Provider, MD  trimethoprim (TRIMPEX) 100 MG tablet Take 100 mg by mouth daily as needed (ACID REFLUX/ UPSET STOMACH).    Yes Historical Provider, MD  zolpidem (AMBIEN) 10 MG tablet Take 5 mg by mouth as needed for sleep.  09/11/12  Yes Historical Provider, MD  glucose blood (ONE TOUCH ULTRA TEST) test strip CHECK BLOOD SUGAR TWICE A DAY AS DIRECTED. 05/07/14   Hendricks Limes, MD  Lancet Device MISC One  Touch [place], test once daily 02/10/14   [place] Circle, FNP  [facility] DELICA LANCETS 07M MISC CHECK BLOOD SUGAR ONCE A DAY. 11/10/13   Hendricks Limes, MD     Family History  Problem Relation Age of Onset  . Leukemia Father     CLL  . Hyperlipidemia Mother   . Multiple sclerosis Mother   . Hypertension Mother   . Diabetes Maternal Grandmother     also had malignant lower neck mass of unknown etiology  . Breast cancer Sister     age 28  . Hypertension Sister     related to migraines  . Migraines Sister   . Stroke Maternal Grandfather 60  . Coronary artery disease Maternal Uncle   . Heart disease Maternal Aunt     porcine valve  . Heart disease Maternal Uncle     valvular disease    Social History   Social History  . Marital status: Married    Spouse name: N/A  . Number of children: 2  . Years of education: N/A   Occupational History  . Building surveyor    Social History Main Topics  . Smoking status: Current Every Day Smoker    Packs/day: 0.50    Years: 30.00    Types: Cigarettes  . Smokeless tobacco: Never Used     Comment: Smoked 1968-present; up to 1 ppd  . Alcohol use No  . Drug use: No  . Sexual activity: No     Comment: 1st intercourse 66 yo-Fewer than 5 partners   Other Topics Concern  . 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  All other systems are negative.  Review of Systems  Constitutional: Negative for activity change, appetite change, fatigue and fever.  Respiratory: Negative for cough and shortness of breath.   Gastrointestinal: Negative for abdominal pain.  Psychiatric/Behavioral: Negative for behavioral problems and confusion.    Vital Signs: BP 133/63    Pulse 77   Temp 97.7 F (36.5 C) (Oral)   Resp 18   Ht 5' 3.5" (1.613 m)   Wt 185 lb (83.9 kg)   LMP 02/28/1995   SpO2 97%   BMI 32.26 kg/m   Physical Exam  Constitutional: She is oriented to person, place, and time. She appears well-nourished.  Cardiovascular: Normal rate, regular rhythm and normal heart sounds.   Pulmonary/Chest: Effort normal and breath sounds normal. She has no wheezes.  Abdominal: Soft. Bowel sounds are normal. There  is no tenderness.  Musculoskeletal: Normal range of motion.  Neurological: She is alert and oriented to person, place, and time.  Skin: Skin is warm and dry.  Psychiatric: She has a normal mood and affect. Her behavior is normal. Judgment and thought content normal.  Nursing note and vitals reviewed.   Mallampati Score:  MD Evaluation Airway: WNL Heart: WNL Abdomen: WNL Chest/ Lungs: WNL ASA  Classification: 2 Mallampati/Airway Score: Two  Imaging: No results found.  Labs:  CBC:  Recent Labs  07/19/15 1438 05/12/16 0605  WBC 10.7* 8.5  HGB 14.0 12.7  HCT 42.2 38.4  PLT 314 328    COAGS:  Recent Labs  05/04/16 1134 05/12/16 0605  INR 1.0 0.90  APTT  --  37*    BMP:  Recent Labs  07/19/15 1438  NA 138  K 4.0  CL 105  CO2 23  GLUCOSE 144*  BUN 18  CALCIUM 9.8  CREATININE 0.79  GFRNONAA >60  GFRAA >60    LIVER FUNCTION TESTS:  Recent Labs  04/27/16 1105  BILITOT 0.6  AST 32  ALT 68*  ALKPHOS 215*  PROT 7.8  ALBUMIN 4.4    TUMOR MARKERS: No results for input(s): AFPTM, CEA, CA199, CHROMGRNA in the last 8760 hours.  Assessment and Plan:  Increasing liver functions CT reveals hepatic steatosis Now scheduled for random liver core biopsy Risks and Benefits discussed with the patient including, but not limited to bleeding, infection, damage to adjacent structures or low yield requiring additional tests. All of the patient's questions were answered, patient is agreeable to proceed. Consent  signed and in chart.   Thank you for this interesting consult.  I greatly enjoyed meeting Niharika Savino and look forward to participating in their care.  A copy of this report was sent to the requesting provider on this date.  Electronically Signed: Dimitrius Steedman A 05/12/2016, 7:10 AM   I spent a total of  30 Minutes   in face to face in clinical consultation, greater than 50% of which was counseling/coordinating care for liver core bx

## 2016-05-12 NOTE — Procedures (Signed)
Interventional Radiology Procedure Note  Procedure: Successful US guided random liver biopsy.  Complications: None.  Estimated Blood Loss: None.  Recommendations: - Bedrest x 3 hrs - DC home  Signed,  Sterling BigHeath K. Lavan Imes, MD

## 2016-05-12 NOTE — Telephone Encounter (Addendum)
Can resume aspirin Monday, metformin can be taken today.   She has a vicodin Rx listed on her meds.  If does not have any of that on hand, then please call in Ultram 50 mg, one tablet every 6 hours as needed for pain.  Disp #12, no refills

## 2016-05-12 NOTE — Telephone Encounter (Signed)
Spoke to patient, she does not have any vicodin left, that Rx was from shoulder surgery 1 1/2 years ago. [facility]am [facility]. Patient advised to not take her ASA until Monday, she may resume metformin today. Patient advised if she has questions or concerns to call the office.

## 2016-05-12 NOTE — Discharge Instructions (Signed)
Liver Biopsy, Care After  These instructions give you information on caring for yourself after your procedure. Your doctor may also give you more specific instructions. Call your doctor if you have any problems or questions after your procedure.  Follow these instructions at home:  · Rest at home for 1-2 days or as told by your doctor.  · Have someone stay with you for at least 24 hours.  · Do not do these things in the first 24 hours:  ? Drive.  ? Use machinery.  ? Take care of other people.  ? Sign legal documents.  ? Take a bath or shower.  · There are many different ways to close and cover a cut (incision). For example, a cut can be closed with stitches, skin glue, or adhesive strips. Follow your doctor's instructions on:  ? Taking care of your cut.  ? Changing and removing your bandage (dressing).  ? Removing whatever was used to close your cut.  · Do not drink alcohol in the first week.  · Do not lift more than 5 pounds or play contact sports for the first 2 weeks.  · Take medicines only as told by your doctor. For 1 week, do not take medicine that has aspirin in it or medicines like ibuprofen.  · Get your test results.  Contact a doctor if:  · A cut bleeds and leaves more than just a small spot of blood.  · A cut is red, puffs up (swells), or hurts more than before.  · Fluid or something else comes from a cut.  · A cut smells bad.  · You have a fever or chills.  Get help right away if:  · You have swelling, bloating, or pain in your belly (abdomen).  · You get dizzy or faint.  · You have a rash.  · You feel sick to your stomach (nauseous) or throw up (vomit).  · You have trouble breathing, feel short of breath, or feel faint.  · Your chest hurts.  · You have problems talking or seeing.  · You have trouble balancing or moving your arms or legs.  This information is not intended to replace advice given to you by your health care provider. Make sure you discuss any questions you have with your health care  provider.  Document Released: 11/23/2007 Document Revised: 07/22/2015 Document Reviewed: 04/11/2013  Elsevier Interactive Patient Education © 2017 Elsevier Inc.

## 2016-05-15 ENCOUNTER — Other Ambulatory Visit: Payer: Medicare Other

## 2016-05-15 DIAGNOSIS — G4733 Obstructive sleep apnea (adult) (pediatric): Secondary | ICD-10-CM

## 2016-05-16 ENCOUNTER — Other Ambulatory Visit: Payer: Self-pay

## 2016-05-16 DIAGNOSIS — K743 Primary biliary cirrhosis: Secondary | ICD-10-CM

## 2016-05-16 MED ORDER — URSODIOL 300 MG PO CAPS: 600.0000 mg | ORAL_CAPSULE | Freq: Two times a day (BID) | ORAL | 6 refills | Status: DC

## 2016-05-17 ENCOUNTER — Telehealth: Payer: Self-pay

## 2016-05-17 NOTE — Telephone Encounter (Signed)
Spoke to patient, she will pick up her new Rx and start taking it on Friday, since she will be out of town until then. She has put it on her calendar to come get her labs done on 5/15 and follow up with Dr. Myrtie Neitheranis on 5/22.

## 2016-05-18 LAB — VITAMIN E
Gamma-Tocopherol (Vit E): 1.5 mg/L (ref <=4.3)
Vitamin E (Alpha Tocopherol): 23.5 mg/L — ABNORMAL HIGH (ref 5.7–19.9)

## 2016-05-18 LAB — VITAMIN A: Vitamin A (Retinoic Acid): 57 ug/dL (ref 38–98)

## 2016-05-19 ENCOUNTER — Ambulatory Visit: Payer: Medicare Other | Admitting: Gastroenterology

## 2016-06-11 ENCOUNTER — Encounter: Payer: Self-pay | Admitting: Gastroenterology

## 2016-06-12 ENCOUNTER — Telehealth: Payer: Self-pay

## 2016-06-12 MED ORDER — URSODIOL 300 MG PO CAPS: 600.0000 mg | ORAL_CAPSULE | Freq: Two times a day (BID) | ORAL | 0 refills | Status: DC

## 2016-06-12 NOTE — Telephone Encounter (Signed)
90 day rx sent

## 2016-06-12 NOTE — Telephone Encounter (Signed)
Patient states that she does not need tramadol. She needs a 90 day supply of ursodiol

## 2016-06-21 DIAGNOSIS — D2261 Melanocytic nevi of right upper limb, including shoulder: Secondary | ICD-10-CM | POA: Diagnosis not present

## 2016-06-21 DIAGNOSIS — L820 Inflamed seborrheic keratosis: Secondary | ICD-10-CM | POA: Diagnosis not present

## 2016-06-21 DIAGNOSIS — D1801 Hemangioma of skin and subcutaneous tissue: Secondary | ICD-10-CM | POA: Diagnosis not present

## 2016-06-21 DIAGNOSIS — D485 Neoplasm of uncertain behavior of skin: Secondary | ICD-10-CM | POA: Diagnosis not present

## 2016-06-21 DIAGNOSIS — L821 Other seborrheic keratosis: Secondary | ICD-10-CM | POA: Diagnosis not present

## 2016-06-23 DIAGNOSIS — R05 Cough: Secondary | ICD-10-CM | POA: Diagnosis not present

## 2016-06-23 DIAGNOSIS — K743 Primary biliary cirrhosis: Secondary | ICD-10-CM | POA: Diagnosis not present

## 2016-06-23 DIAGNOSIS — N6089 Other benign mammary dysplasias of unspecified breast: Secondary | ICD-10-CM | POA: Diagnosis not present

## 2016-06-23 DIAGNOSIS — E1140 Type 2 diabetes mellitus with diabetic neuropathy, unspecified: Secondary | ICD-10-CM | POA: Diagnosis not present

## 2016-07-06 ENCOUNTER — Other Ambulatory Visit (INDEPENDENT_AMBULATORY_CARE_PROVIDER_SITE_OTHER): Payer: Medicare Other

## 2016-07-06 DIAGNOSIS — K743 Primary biliary cirrhosis: Secondary | ICD-10-CM | POA: Diagnosis not present

## 2016-07-06 LAB — HEPATIC FUNCTION PANEL
ALBUMIN: 4.2 g/dL (ref 3.5–5.2)
ALK PHOS: 117 U/L (ref 39–117)
ALT: 25 U/L (ref 0–35)
AST: 17 U/L (ref 0–37)
BILIRUBIN DIRECT: 0.1 mg/dL (ref 0.0–0.3)
TOTAL PROTEIN: 6.9 g/dL (ref 6.0–8.3)
Total Bilirubin: 0.4 mg/dL (ref 0.2–1.2)

## 2016-07-18 ENCOUNTER — Encounter: Payer: Self-pay | Admitting: Gastroenterology

## 2016-07-18 ENCOUNTER — Ambulatory Visit (INDEPENDENT_AMBULATORY_CARE_PROVIDER_SITE_OTHER): Payer: Medicare Other | Admitting: Gastroenterology

## 2016-07-18 VITALS — BP 134/62 | HR 76 | Ht 64.0 in | Wt 200.0 lb

## 2016-07-18 DIAGNOSIS — K743 Primary biliary cirrhosis: Secondary | ICD-10-CM | POA: Diagnosis not present

## 2016-07-18 DIAGNOSIS — K580 Irritable bowel syndrome with diarrhea: Secondary | ICD-10-CM | POA: Diagnosis not present

## 2016-07-18 NOTE — Patient Instructions (Signed)
If you are age 68 or older, your body mass index should be between 23-30. Your Body mass index is 34.33 kg/m. If this is out of the aforementioned range listed, please consider follow up with your Primary Care Provider.  If you are age 664 or younger, your body mass index should be between 19-25. Your Body mass index is 34.33 kg/m. If this is out of the aformentioned range listed, please consider follow up with your Primary Care Provider.   Please have labs completed in 3 months.  Thank you for choosing [facility]  Dr Amada JupiterHenry Danis III

## 2016-07-18 NOTE — Progress Notes (Signed)
     [facility] Progress Note  Chief Complaint: Primary biliary cholangitis  Subjective  History:  Alexa Buchanan is feeling well since I last saw her. 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 I started her on UDCA 600 mg twice daily. 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 Nevertheless, she feels it is worth it given the long-term control of this condition. She was doing some extensive reading about PBC and its possible outcomes, and understood the importance of medicine.  She denies rectal bleeding or weight loss. 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 She reports that years ago her primary care provider gave her once daily PPI with a thought might be reflux. She has described it as an intermittent burning feeling in her mid back. Does not occur with certain position or time of day or eating. Sometimes it feels better if she drinks milk but not necessarily. She recently stopped the PPI because it was not clear she needed it. I told her I did not think the symptoms sounded typical for reflux. ROS: Cardiovascular:  no chest pain Respiratory: no dyspnea  The patient's Past Medical, Family and Social History were reviewed and are on file in the EMR.  Objective:  Med list reviewed  Vital signs in last 24 hrs: Vitals:   07/18/16 1134  BP: 134/62  Pulse: 76    No exam today. Entire 15 minute visit spent reviewing results, diagnosis and plan.    Recent Labs:   Hepatic Function Latest Ref Rng & Units 07/06/2016 04/27/2016 11/07/2013  Total Protein 6.0 - 8.3 g/dL 6.9 7.8 7.0  Albumin 3.5 - 5.2 g/dL 4.2 4.4 3.9  AST 0 - 37 U/L 17 32 21  ALT 0 - 35 U/L 25 68(H) 27  Alk Phosphatase 39 - 117 U/L 117 215(H) 80  Total Bilirubin 0.2 - 1.2 mg/dL 0.4 0.6 0.5  Bilirubin, Direct 0.0 - 0.3 mg/dL 0.1 0.1 0.1   Liver biopsy results were reviewed over the phone with the  patient and again today.  '@ASSESSMENTPLANBEGIN'$ @ Assessment: Encounter Diagnoses  Name Primary?  . 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 Irritable bowel syndrome with diarrhea     She will continue this current dose of UDCA indefinitely. I have encouraged her to speak with her pharmacist and see if it might: A different form the disease or to swallow. Even if it is lower dose she might want to take more tablets if they are easier to swallow.  Plan:  Repeat hepatic function panel in 3 months, see me in a year unless she is having further problems or worsening of LFTs.  Nelida Meuse III

## 2016-09-05 DIAGNOSIS — Z87440 Personal history of urinary (tract) infections: Secondary | ICD-10-CM | POA: Diagnosis not present

## 2016-09-05 DIAGNOSIS — R300 Dysuria: Secondary | ICD-10-CM | POA: Diagnosis not present

## 2016-09-05 DIAGNOSIS — N3010 Interstitial cystitis (chronic) without hematuria: Secondary | ICD-10-CM | POA: Diagnosis not present

## 2016-10-04 DIAGNOSIS — K743 Primary biliary cirrhosis: Secondary | ICD-10-CM | POA: Diagnosis not present

## 2016-10-04 DIAGNOSIS — M25512 Pain in left shoulder: Secondary | ICD-10-CM | POA: Diagnosis not present

## 2016-10-04 DIAGNOSIS — E1140 Type 2 diabetes mellitus with diabetic neuropathy, unspecified: Secondary | ICD-10-CM | POA: Diagnosis not present

## 2016-10-04 DIAGNOSIS — F418 Other specified anxiety disorders: Secondary | ICD-10-CM | POA: Diagnosis not present

## 2016-10-11 ENCOUNTER — Telehealth: Payer: Self-pay | Admitting: Gastroenterology

## 2016-10-11 ENCOUNTER — Other Ambulatory Visit: Payer: Self-pay | Admitting: Gastroenterology

## 2016-10-11 MED ORDER — URSODIOL 300 MG PO CAPS: 600.0000 mg | ORAL_CAPSULE | Freq: Two times a day (BID) | ORAL | 1 refills | Status: DC

## 2016-10-11 NOTE — Telephone Encounter (Signed)
Completed as directed.

## 2016-10-11 NOTE — Telephone Encounter (Signed)
Yes, please refill at this dose/instruction:  300 mg capsule.  2 capsules (600 mg) by mouth twice daily.  Disp #360, RF 1

## 2016-10-16 ENCOUNTER — Telehealth: Payer: Self-pay

## 2016-10-16 ENCOUNTER — Other Ambulatory Visit: Payer: Self-pay

## 2016-10-16 DIAGNOSIS — K743 Primary biliary cirrhosis: Secondary | ICD-10-CM

## 2016-10-16 NOTE — Telephone Encounter (Signed)
Left a detailed voice mail on cell phone number to have 3 month repeat lab work (hepatic panel).

## 2016-10-17 ENCOUNTER — Other Ambulatory Visit (INDEPENDENT_AMBULATORY_CARE_PROVIDER_SITE_OTHER): Payer: Medicare Other

## 2016-10-17 DIAGNOSIS — K743 Primary biliary cirrhosis: Secondary | ICD-10-CM | POA: Diagnosis not present

## 2016-10-17 LAB — HEPATIC FUNCTION PANEL
ALBUMIN: 3.9 g/dL (ref 3.5–5.2)
ALK PHOS: 83 U/L (ref 39–117)
ALT: 20 U/L (ref 0–35)
AST: 15 U/L (ref 0–37)
Bilirubin, Direct: 0.2 mg/dL (ref 0.0–0.3)
Total Bilirubin: 0.4 mg/dL (ref 0.2–1.2)
Total Protein: 7 g/dL (ref 6.0–8.3)

## 2016-11-02 DIAGNOSIS — H2513 Age-related nuclear cataract, bilateral: Secondary | ICD-10-CM | POA: Diagnosis not present

## 2016-11-21 ENCOUNTER — Other Ambulatory Visit: Payer: Self-pay | Admitting: Gynecology

## 2016-11-21 DIAGNOSIS — Z1231 Encounter for screening mammogram for malignant neoplasm of breast: Secondary | ICD-10-CM

## 2016-11-22 ENCOUNTER — Ambulatory Visit
Admission: RE | Admit: 2016-11-22 | Discharge: 2016-11-22 | Disposition: A | Discharge status: Home / Self Care | Payer: Medicare Other | Source: Ambulatory Visit | Attending: Gynecology | Admitting: Gynecology

## 2016-11-22 DIAGNOSIS — Z1231 Encounter for screening mammogram for malignant neoplasm of breast: Secondary | ICD-10-CM | POA: Diagnosis not present

## 2016-11-24 ENCOUNTER — Ambulatory Visit (INDEPENDENT_AMBULATORY_CARE_PROVIDER_SITE_OTHER): Payer: Medicare Other | Admitting: Gynecology

## 2016-11-24 ENCOUNTER — Encounter: Payer: Self-pay | Admitting: Gynecology

## 2016-11-24 VITALS — BP 124/74 | Ht 65.0 in | Wt 197.0 lb

## 2016-11-24 DIAGNOSIS — Z01411 Encounter for gynecological examination (general) (routine) with abnormal findings: Secondary | ICD-10-CM | POA: Diagnosis not present

## 2016-11-24 DIAGNOSIS — N952 Postmenopausal atrophic vaginitis: Secondary | ICD-10-CM

## 2016-11-24 NOTE — Patient Instructions (Signed)
Followup for bone density as scheduled. 

## 2016-11-24 NOTE — Progress Notes (Signed)
    Nayanna Seaborn 03/08/1950 [id number]        66 y.o.  [id number] for breast and pelvic exam   Past medical history,surgical history, problem list, medications, allergies, family history and social history were all reviewed and documented as reviewed in the EPIC chart.  ROS:  Performed with pertinent positives and negatives included in the history, assessment and plan.   Additional significant findings:  None  Exam: [name] assistant Vitals:   11/24/16 0914  BP: 124/74  Weight: 197 lb (89.4 kg)  Height:  (1.651 m)   Body mass index is 32.78 kg/m.  General appearance:  Normal affect, orientation and appearance. Skin: Grossly normal HEENT: Without gross lesions.  No cervical or supraclavicular adenopathy. Thyroid normal.  Lungs:  Clear without wheezing, rales or rhonchi Cardiac: RR, without RMG Abdominal:  Soft, nontender, without masses, guarding, rebound, organomegaly or hernia Breasts:  Examined lying and sitting without masses, retractions, discharge or axillary adenopathy. Pelvic:  Ext, BUS, Vagina: With atrophic changes  Adnexa: Without masses or tenderness    Anus and perineum: Normal   Rectovaginal: Normal sphincter tone without palpated masses or tenderness.    Assessment/Plan:  66 y.o. [id number] female for breast and pelvic exam  1. Postmenopausal/atrophic genital changes. Status post TVH BSO sling for prolapse 2003. No significant menopausal symptoms such as hot flushes, night sweats or vaginal dryness. 2. Pap smear 2015. No Pap smear done today. Status post hysterectomy for benign indications, over the age of 30, No history of abnormal Pap smears. We'll plan to stop screening per current screening guidelines. 3. DEXA 2012 normal. Never followed up for DEXA last year as recommended. Again recommended baseline DEXA now. Patient smokes and has an increased risk for bone loss based on age, postmenopausal status and smoking. Patient agrees to schedule. 4. Mammography  10/2016. Continue with annual mammography next year. Breast exam normal today. 5. Colonoscopy 2010. Reported repeat interval 10 years. 6. Health maintenance. No routine lab work done as patient does this at her primary physician's office. Follow up 1 year, sooner as needed.   Dara Lords MD, 9:53 AM 11/24/2016

## 2016-12-19 ENCOUNTER — Ambulatory Visit (INDEPENDENT_AMBULATORY_CARE_PROVIDER_SITE_OTHER): Payer: Medicare Other

## 2016-12-19 ENCOUNTER — Other Ambulatory Visit: Payer: Self-pay | Admitting: Gynecology

## 2016-12-19 DIAGNOSIS — Z780 Asymptomatic menopausal state: Secondary | ICD-10-CM

## 2016-12-19 DIAGNOSIS — Z01411 Encounter for gynecological examination (general) (routine) with abnormal findings: Secondary | ICD-10-CM

## 2016-12-20 ENCOUNTER — Encounter: Payer: Self-pay | Admitting: Gynecology

## 2017-01-12 DIAGNOSIS — E1140 Type 2 diabetes mellitus with diabetic neuropathy, unspecified: Secondary | ICD-10-CM | POA: Diagnosis not present

## 2017-01-12 DIAGNOSIS — K743 Primary biliary cirrhosis: Secondary | ICD-10-CM | POA: Diagnosis not present

## 2017-01-12 DIAGNOSIS — R918 Other nonspecific abnormal finding of lung field: Secondary | ICD-10-CM | POA: Diagnosis not present

## 2017-01-12 DIAGNOSIS — K219 Gastro-esophageal reflux disease without esophagitis: Secondary | ICD-10-CM | POA: Diagnosis not present

## 2017-03-08 ENCOUNTER — Other Ambulatory Visit: Payer: Self-pay | Admitting: Gastroenterology

## 2017-03-08 DIAGNOSIS — K743 Primary biliary cirrhosis: Secondary | ICD-10-CM

## 2017-03-08 NOTE — Telephone Encounter (Signed)
Refill request for Actigall. Last seen 06-2016. No current follow up.

## 2017-03-08 NOTE — Telephone Encounter (Signed)
I will send in the med refill.  Please arrange a hepatic function panel in the next few weeks.  If she has had those labs done with primary care within last 2 months, then please get the results so I can review.

## 2017-03-09 NOTE — Telephone Encounter (Signed)
Pt notified and aware. She agrees to have the labs completed.

## 2017-03-13 ENCOUNTER — Other Ambulatory Visit (INDEPENDENT_AMBULATORY_CARE_PROVIDER_SITE_OTHER): Payer: Medicare Other

## 2017-03-13 DIAGNOSIS — K743 Primary biliary cirrhosis: Secondary | ICD-10-CM | POA: Diagnosis not present

## 2017-03-13 LAB — HEPATIC FUNCTION PANEL
ALK PHOS: 105 U/L (ref 39–117)
ALT: 22 U/L (ref 0–35)
AST: 15 U/L (ref 0–37)
Albumin: 4.3 g/dL (ref 3.5–5.2)
BILIRUBIN DIRECT: 0 mg/dL (ref 0.0–0.3)
TOTAL PROTEIN: 7.5 g/dL (ref 6.0–8.3)
Total Bilirubin: 0.4 mg/dL (ref 0.2–1.2)

## 2017-03-20 DIAGNOSIS — E1165 Type 2 diabetes mellitus with hyperglycemia: Secondary | ICD-10-CM | POA: Diagnosis not present

## 2017-03-21 DIAGNOSIS — E1165 Type 2 diabetes mellitus with hyperglycemia: Secondary | ICD-10-CM | POA: Diagnosis not present

## 2017-04-12 DIAGNOSIS — Z87440 Personal history of urinary (tract) infections: Secondary | ICD-10-CM | POA: Diagnosis not present

## 2017-04-12 DIAGNOSIS — N3010 Interstitial cystitis (chronic) without hematuria: Secondary | ICD-10-CM | POA: Diagnosis not present

## 2017-04-12 DIAGNOSIS — G4700 Insomnia, unspecified: Secondary | ICD-10-CM | POA: Diagnosis not present

## 2017-04-24 ENCOUNTER — Ambulatory Visit (INDEPENDENT_AMBULATORY_CARE_PROVIDER_SITE_OTHER): Payer: Medicare Other | Admitting: Orthopaedic Surgery

## 2017-04-24 ENCOUNTER — Encounter (INDEPENDENT_AMBULATORY_CARE_PROVIDER_SITE_OTHER): Payer: Self-pay | Admitting: Orthopaedic Surgery

## 2017-04-24 ENCOUNTER — Encounter: Payer: Self-pay | Admitting: Gastroenterology

## 2017-04-24 VITALS — BP 136/63 | HR 85 | Resp 14 | Ht 63.0 in | Wt 197.0 lb

## 2017-04-24 DIAGNOSIS — M7502 Adhesive capsulitis of left shoulder: Secondary | ICD-10-CM

## 2017-04-24 DIAGNOSIS — M25512 Pain in left shoulder: Secondary | ICD-10-CM | POA: Diagnosis not present

## 2017-04-24 DIAGNOSIS — M7542 Impingement syndrome of left shoulder: Secondary | ICD-10-CM | POA: Diagnosis not present

## 2017-04-24 MED ORDER — OXYCODONE HCL 5 MG PO TABS: 5.0000 mg | ORAL_TABLET | Freq: Four times a day (QID) | ORAL | 0 refills | Status: DC | PRN

## 2017-04-24 NOTE — Progress Notes (Signed)
Office Visit Note   Patient: Alexa Buchanan           Date of Birth: 12-18-1950           MRN: [id number] Visit Date: 04/24/2017              Requested by: Alysia Penna, MD [address] [place], [place] [address] PCP: Alysia Penna, MD   Assessment & Plan: Visit Diagnoses:  1. Adhesive capsulitis of left shoulder   2. Left shoulder pain, unspecified chronicity   3. Impingement syndrome of left shoulder     Plan: #1: MRI scan of the left shoulder rule out rotator cuff tear #2: I have checked with the pharmacist to come for medication for pain that does not affect the liver.  Apparently this is a problem but the pharmacist felt that by cutting the dosage between one third and one half would be acceptable.  Therefore I have written her prescription for oxycodone so there will not be any Tylenol in it.  At this pill in for try to use it for pain. #3: Return after MRI scan.  Follow-Up Instructions: Return in about 2 weeks (around 05/08/2017) for review of mri.   Face-to-face time spent with patient was greater than 30 minutes.  Greater than 50% of the time was spent in counseling and coordination of care.  Orders:  No orders of the defined types were placed in this encounter.  Meds ordered this encounter  Medications  . oxyCODONE (ROXICODONE) 5 MG immediate release tablet    Sig: Take 1 tablet (5 mg total) by mouth every 6 (six) hours as needed for moderate pain or severe pain.    Dispense:  20 tablet    Refill:  0    Order Specific Question:   Supervising Provider    Answer:   Valeria Batman [[id number]]      Procedures: No procedures performed   Clinical Data: No additional findings.   Subjective: Chief Complaint  Patient presents with  . Left Shoulder - Pain, Weakness, Follow-up    Pt presents with follow up of chronic left shoulder pain. She relates she did everything in the plan but still has pain. The L shoulder became increasingly painful over the last  3 days. No injury. Arm is burning as well.    HPI  Alexa Buchanan is a very pleasant 67 year old white female who is seen today for evaluation of her left shoulder.  She was seen back in January 2018 and at that time had bilateral shoulder pain.  She did have a prior right rotator cuff repair.  This was treated conservatively and she did well.  She does states that she has chronic shoulder pain on the left.  She though about 3 days ago all of a sudden had this significant left shoulder pain which has been worsening.  She denies any history of injury or trauma.  This is now worsened to the point where she has difficulty with range of motion.  She has a burning type sensation.  Pain in the proximal humeral area.  Pain in the biceps.  She is limited in what she can use because of a liver disorder recently diagnosed.  Seen today for evaluation.  Review of Systems  Constitutional: Positive for fatigue. Negative for chills and fever.  HENT: Negative for hearing loss and tinnitus.   Eyes: Negative for itching.  Respiratory: Negative for chest tightness and shortness of breath.   Cardiovascular: Negative for chest  pain, palpitations and leg swelling.  Gastrointestinal: Negative for blood in stool, constipation and diarrhea.  Endocrine: Negative for polyuria.  Genitourinary: Negative for dysuria.  Musculoskeletal: Negative for back pain, joint swelling, neck pain and neck stiffness.  Allergic/Immunologic: Negative for immunocompromised state.  Neurological: Positive for weakness and light-headedness. Negative for dizziness, numbness and headaches.  Hematological: Does not bruise/bleed easily.  Psychiatric/Behavioral: Negative for sleep disturbance. The patient is not nervous/anxious.      Objective: Vital Signs: BP 136/63   Pulse 85   Resp 14   Ht 5\' 3"  (1.6 m)   Wt 197 lb (89.4 kg)   LMP 02/28/1995   BMI 34.90 kg/m   Physical Exam  Constitutional: She is oriented to person, place, and time. She  appears well-developed and well-nourished.  HENT:  Head: Normocephalic and atraumatic.  Eyes: EOM are normal. Pupils are equal, round, and reactive to light.  Pulmonary/Chest: Effort normal.  Neurological: She is alert and oriented to person, place, and time.  Skin: Skin is warm and dry.  Psychiatric: She has a normal mood and affect. Her behavior is normal. Judgment and thought content normal.    Ortho Exam  Today she has diffuse tenderness about the shoulder girdles.  Pain in the supraspinatus area.  I can get her to about 80 degrees of abduction about 110 degrees of forward flexion.  Limited with internal and external rotation.  Unable to reach more than her mid waist with internal rotation try to reach her back.  She does have positive empty can test.  Overall her strength is good.  Some breakaway weakness in abduction.  Specialty Comments:  No specialty comments available.  Imaging: No results found.   PMFS History: Current Outpatient Medications  Medication Sig Dispense Refill  . aspirin EC 81 MG tablet Take 81 mg by mouth daily.    . Calcium Carbonate-Vitamin D (CALCIUM + D PO) Take 1 tablet by mouth 2 (two) times daily.     . cetirizine (ZYRTEC) 10 MG tablet Take 10 mg by mouth daily.    . Cholecalciferol (VITAMIN D3) 2000 UNITS TABS Take 2,000 Units by mouth 2 (two) times daily.     Marland Kitchen gabapentin (NEURONTIN) 100 MG capsule Take 100 mg by mouth 2 (two) times daily.     Marland Kitchen glucose blood (ONE TOUCH ULTRA TEST) test strip CHECK BLOOD SUGAR TWICE A DAY AS DIRECTED. 50 each 6  . liraglutide (VICTOZA) 18 MG/3ML SOPN Inject 1.2 mg into the skin daily.    . metFORMIN (GLUCOPHAGE-XR) 500 MG 24 hr tablet TAKE 1 TABLET ONCE DAILY. 90 tablet 1  . Multiple Vitamin (MULTIVITAMIN) capsule Take 1 capsule by mouth daily. Centrum Silver.    [name] DELICA LANCETS 33G MISC CHECK BLOOD SUGAR ONCE A DAY. 100 each 3  . oxybutynin (DITROPAN XL) 15 MG 24 hr tablet TAKE 1 TABLET DAILY.    Marland Kitchen  oxyCODONE (ROXICODONE) 5 MG immediate release tablet Take 1 tablet (5 mg total) by mouth every 6 (six) hours as needed for moderate pain or severe pain. 20 tablet 0  . trimethoprim (TRIMPEX) 100 MG tablet Take 100 mg by mouth daily as needed (ACID REFLUX/ UPSET STOMACH).     Marland Kitchen ursodiol (ACTIGALL) 300 MG capsule TAKE (2) CAPSULES BY MOUTH TWICE DAILY. 360 capsule 1  . zolpidem (AMBIEN) 10 MG tablet Take 5 mg by mouth as needed for sleep.      Current Facility-Administered Medications  Medication Dose Route Frequency Provider Last Rate Last  Dose  . pneumococcal 23 valent vaccine (PNU-IMMUNE) injection 0.5 mL  0.5 mL Intramuscular Tomorrow-1000 Pecola LawlessHopper, William F, MD        Patient Active Problem List   Diagnosis Date Noted  . Acute bronchitis 02/10/2014  . Smoker 11/12/2013  . Type II or unspecified type diabetes mellitus without mention of complication, uncontrolled 12/03/2012  . Elevated blood pressure reading without diagnosis of hypertension 09/04/2012  . OSA (obstructive sleep apnea) 07/27/2011  . Insomnia 07/27/2011  . Wheezing 07/27/2011  . Unspecified adverse effect of unspecified drug, medicinal and biological substance 07/04/2011  . Vitamin D deficiency 12/29/2010  . CERVICAL RADICULOPATHY, RIGHT 04/21/2010  . 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  . DIVERTICULOSIS, COLON 09/14/2008  . DYSMETABOLIC SYNDROME X 09/10/2007  . IBS 09/10/2007  . 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 DEPRESSION 02/04/2007  . MIGRAINE HEADACHE 02/04/2007  . STRESS INCONTINENCE 02/04/2007  . HISTOPLASMOSIS, HX OF 02/04/2007  . LIVER FUNCTION TESTS, ABNORMAL, HX OF 02/04/2007  . COLITIS, HX OF 02/04/2007   Past Medical History:  Diagnosis Date  . Arthritis   . Complication of anesthesia    woke up to soon.  reported "bad gag reflex"     . Diabetes mellitus    Type 2  . Diverticulosis   . Dysmetabolic syndrome X   . Histoplasmosis 1971  . IBS (irritable  bowel syndrome)   . Liver disease    PBC  . Migraines    ice pick headaches, Dr. Marylou Flesherohmier  . OSA (obstructive sleep apnea) 07/27/2011   Home sleep study 08/18/11 >> AHI 8.1, SpO2 low 74%.  pt stated she does not have sleep apnes  . Seasonal allergies   . UTI symptoms     Family History  Problem Relation Age of Onset  . Leukemia Father        CLL  . Hyperlipidemia Mother   . Multiple sclerosis Mother   . Hypertension Mother   . Diabetes Maternal Grandmother        also had malignant lower neck mass of unknown etiology  . Breast cancer Sister        age 69  . Hypertension Sister        related to migraines  . Migraines Sister   . Stroke Maternal Grandfather [id number]  . Coronary artery disease Maternal Uncle   . Heart disease Maternal Aunt        porcine valve  . Heart disease Maternal Uncle        valvular disease    Past Surgical History:  Procedure Laterality Date  . BREAST LUMPECTOMY Right 2017   atypical lobular hyperplasia  . BREAST LUMPECTOMY WITH RADIOACTIVE SEED LOCALIZATION Right 07/22/2015   Procedure: BREAST LUMPECTOMY WITH RADIOACTIVE SEED LOCALIZATION;  Surgeon: Abigail Miyamotoouglas Blackman, MD;  Location: [facility] OR;  Service: General;  Laterality: Right;  . COLONOSCOPY     Tics; Dr Juanda ChanceBrodie  . DILATION AND CURETTAGE OF UTERUS    . Gastric Plication  1981  . KNEE ARTHROSCOPY Left 1990  . LASER ABLATION OF VASCULAR LESION  2009   Dr. Dorma RussellKraus, wart removed from nose  . RECTOCELE REPAIR  2003   with vaginal wall repair and sling  . ROTATOR CUFF REPAIR Right 2016  . TONSILLECTOMY     1982/1992  . VAGINAL HYSTERECTOMY  2003   Vag Hyst,BSO,Sling, posterior repair  . VULVA SURGERY     keratoses  . WRIST SURGERY Left 1988    tendon repair   Social History  Occupational History  . Occupation: Orthoptist  Tobacco Use  . Smoking status: Current Every Day Smoker    Packs/day: 0.50    Years: 30.00    Pack years: 15.00    Types: Cigarettes  . Smokeless tobacco: Never Used  .  Tobacco comment: Smoked 1968-present; up to 1 ppd  Substance and Sexual Activity  . Alcohol use: No  . Drug use: No  . Sexual activity: No    Birth control/protection: Surgical    Comment: 1st intercourse 67 yo-Fewer than 5 partners

## 2017-04-27 ENCOUNTER — Ambulatory Visit
Admission: RE | Admit: 2017-04-27 | Discharge: 2017-04-27 | Disposition: A | Discharge status: Home / Self Care | Payer: Medicare Other | Source: Ambulatory Visit | Attending: Orthopedic Surgery | Admitting: Orthopedic Surgery

## 2017-04-27 DIAGNOSIS — M25512 Pain in left shoulder: Secondary | ICD-10-CM

## 2017-04-27 DIAGNOSIS — M7522 Bicipital tendinitis, left shoulder: Secondary | ICD-10-CM | POA: Diagnosis not present

## 2017-04-29 IMAGING — US US BIOPSY
1 series · 13 of 24 positions shown · non-contrast
Comparison: none

INDICATION: 66-year-old female with transaminitis and positive mitochondrial
antibodies concerning for primary biliary cirrhosis. She presents
for ultrasound-guided random liver biopsy to confirm.

[Series 1: us biopsy · 0.26mm/px · 13 of 24 slices shown]
[im 1/24]
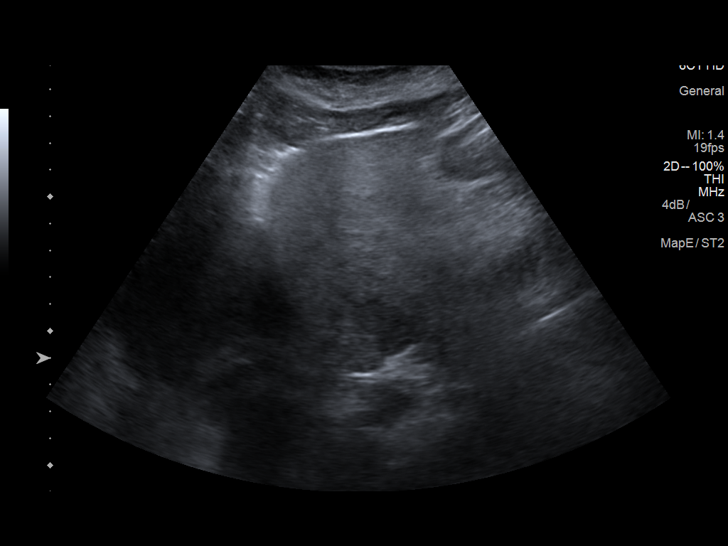
[im 3/24]
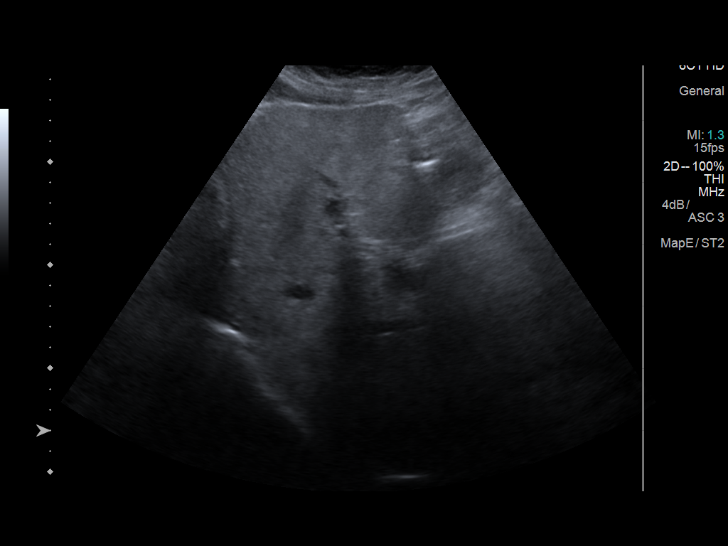
[im 5/24]
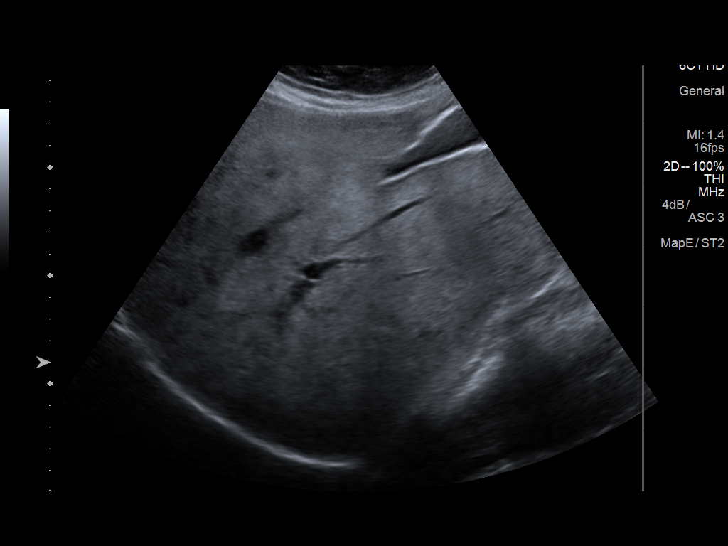
[im 7/24]
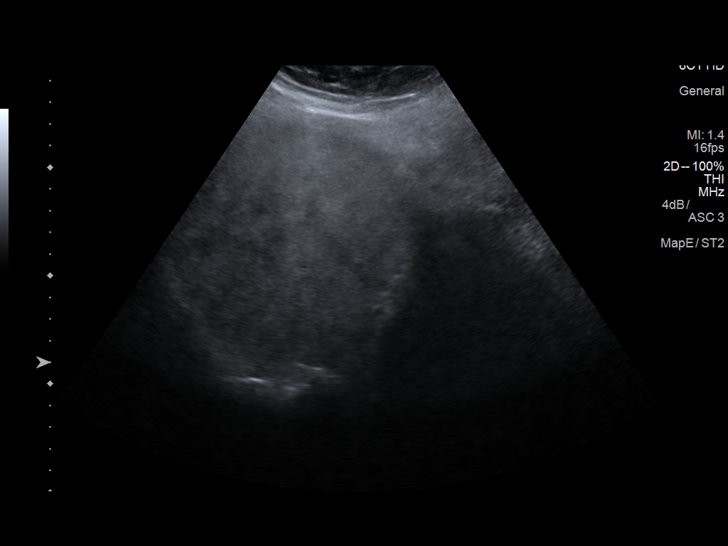
[im 9/24]
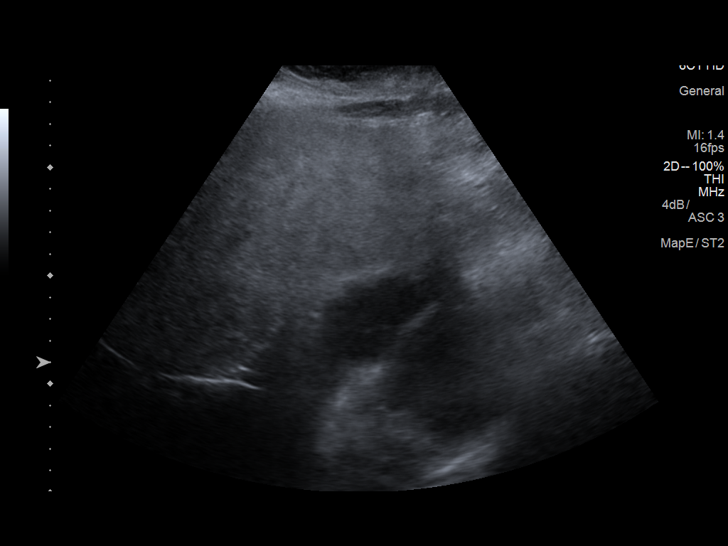
[im 11/24]
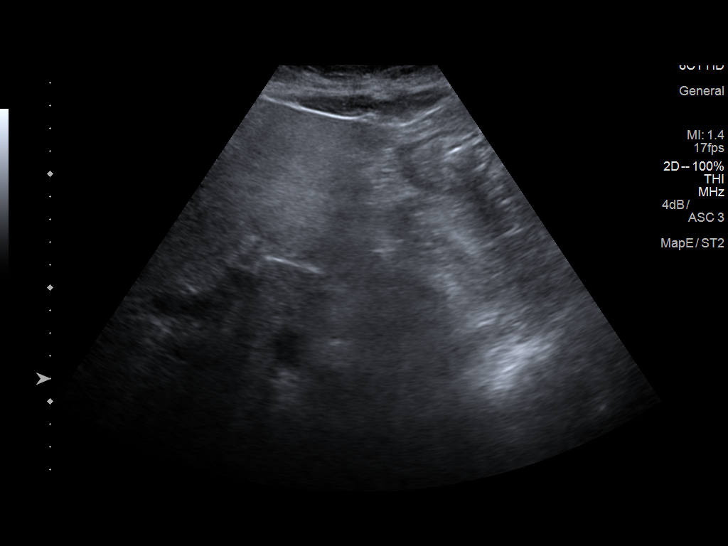
[im 13/24]
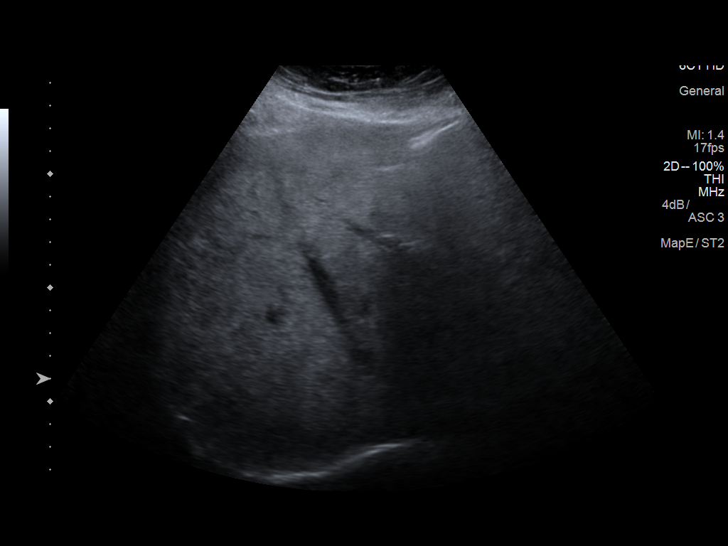
[im 14/24]
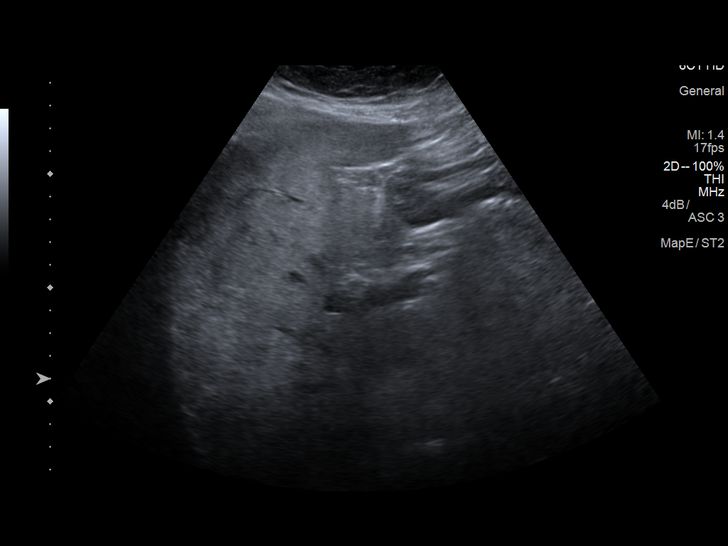
[im 16/24]
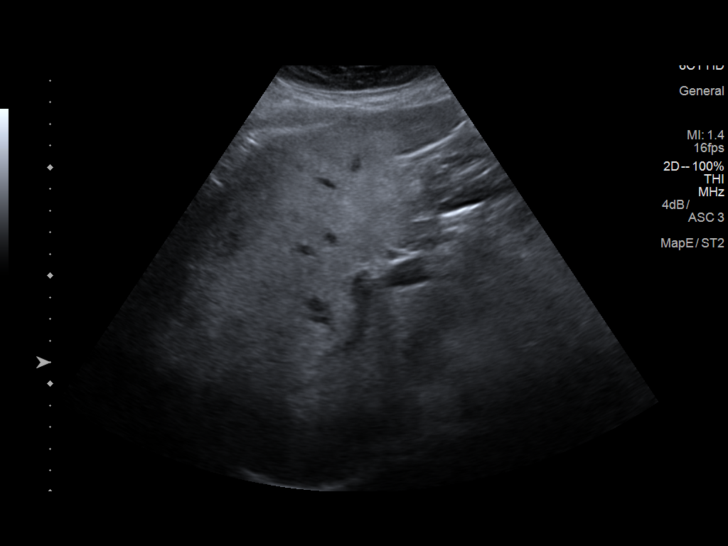
[im 18/24]
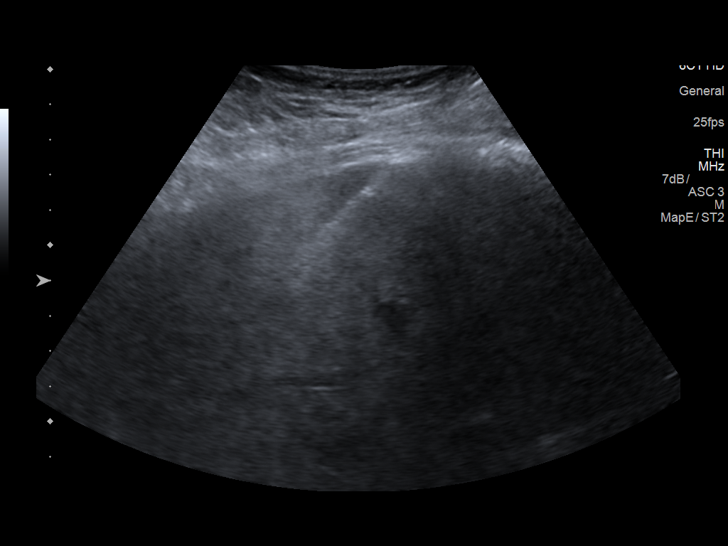
[im 20/24]
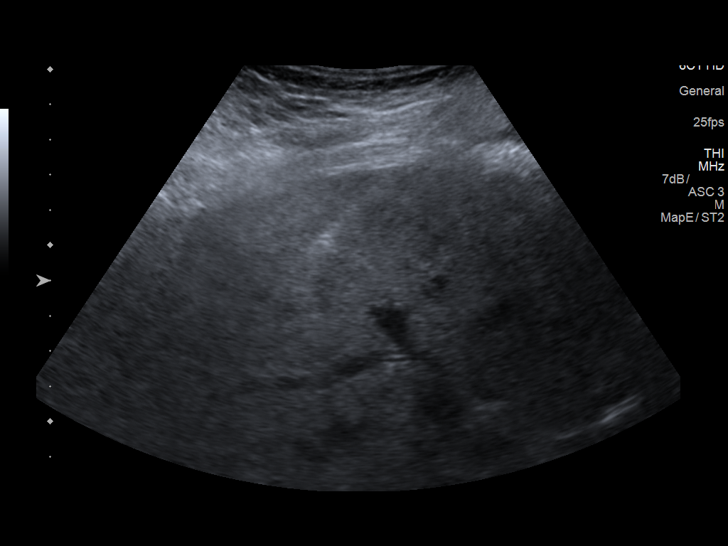
[im 22/24]
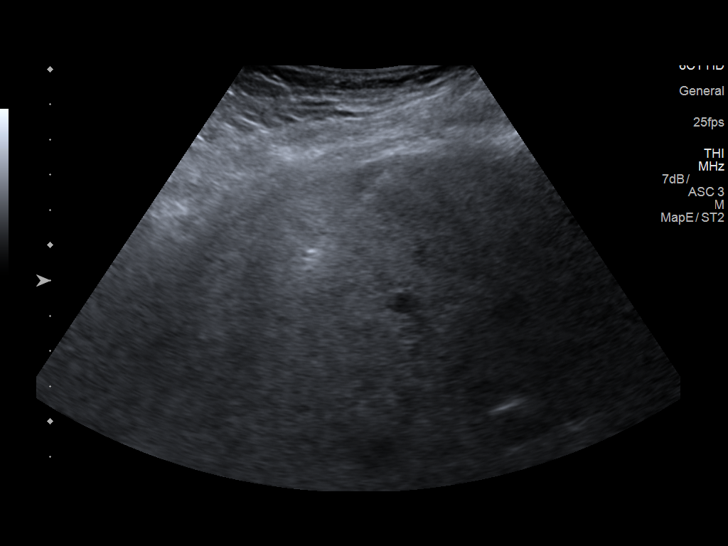
[im 24/24]
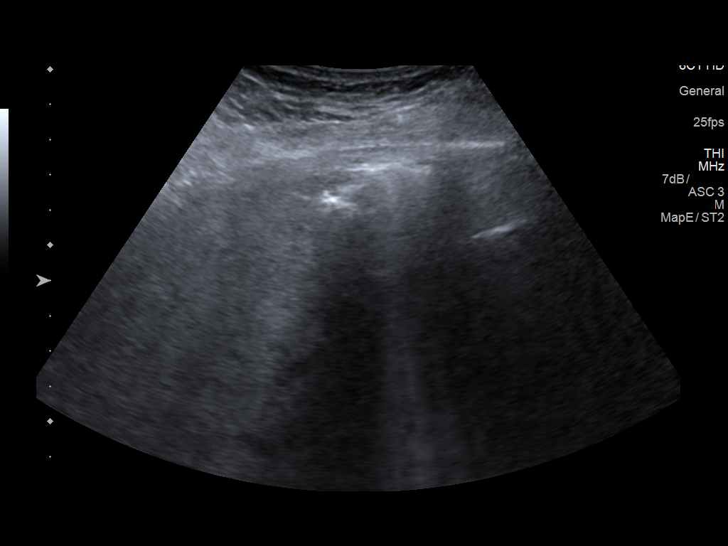

[13 of 24 positions shown; findings below may reference images not displayed]

EXAM:
Ultrasound-guided core biopsy of the liver

MEDICATIONS:
None.

ANESTHESIA/SEDATION:
Fentanyl 100 mcg IV; Versed Three mg IV

Moderate Sedation Time:  18 minutes

The patient was continuously monitored during the procedure by the
interventional radiology nurse under my direct supervision.

FLUOROSCOPY TIME:  Fluoroscopy Time: 0 minutes 0 seconds (0 mGy).

COMPLICATIONS:
None immediate.

PROCEDURE:
Informed consent was obtained from the patient following explanation
of the procedure, risks, benefits and alternatives. The patient
understands, agrees and consents for the procedure. All questions
were addressed. A time out was performed.

The right upper quadrant was interrogated with ultrasound. A
relatively avascular plane of the liver was identified. A suitable
skin entry site was selected and marked. The region was then
sterilely prepped and draped in standard fashion using chlorhexidine
skin prep. Local anesthesia was attained by infiltration with 1%
lidocaine. A small dermatotomy was made.

Under real-time sonographic guidance, a 17 gauge trocar needle was
advanced into the liver. Multiple 18 gauge core biopsies were then
coaxially obtained. Needle placement was confirmed on all biopsy
passes with real-time sonography. Biopsy specimens were placed in
formalin and delivered to pathology for further analysis.

As the introducer needle was removed, the biopsy tract was embolized
with a Gel-Foam slurry. Post biopsy ultrasound imaging demonstrates
no active bleeding or perihepatic hematoma. The patient tolerated
the procedure well.
IMPRESSION: Technically successful ultrasound-guided random core biopsy of the
liver.

## 2017-04-30 ENCOUNTER — Encounter (INDEPENDENT_AMBULATORY_CARE_PROVIDER_SITE_OTHER): Payer: Self-pay | Admitting: Orthopaedic Surgery

## 2017-04-30 ENCOUNTER — Ambulatory Visit (INDEPENDENT_AMBULATORY_CARE_PROVIDER_SITE_OTHER): Payer: Medicare Other | Admitting: Orthopaedic Surgery

## 2017-04-30 VITALS — BP 141/73 | HR 87 | Resp 14 | Ht 65.0 in | Wt 180.0 lb

## 2017-04-30 DIAGNOSIS — G8929 Other chronic pain: Secondary | ICD-10-CM | POA: Diagnosis not present

## 2017-04-30 DIAGNOSIS — M25512 Pain in left shoulder: Secondary | ICD-10-CM | POA: Diagnosis not present

## 2017-04-30 NOTE — Progress Notes (Signed)
Office Visit Note   Patient: Alexa Buchanan           Date of Birth: 1950-09-15           MRN: [id number] Visit Date: 04/30/2017              Requested by: Alysia Penna, MD [address] [place], [place] [address] PCP: Alysia Penna, MD   Assessment & Plan: Visit Diagnoses:  1. Chronic left shoulder pain     Plan: MRI scan reveals mild tendinosis of the supraspinatus tendon and mild tendinosis of the subscapularis tendon with a high-grade partial-thickness bursal surface tear. Teres minor and subscapularis tendons were intact. No muscle atrophy. Mild tendinosis of the intra-articular portion along head of the biceps tendon. Acromioclavicular joint reveals mild arthropathy. No joint effusion or chondral defect of the glenohumeral joint. No acute osseous abnormalities or aggressive lesions. Long discussion regarding the findings. Suggest exercises, NSAIDs, ice or heat. Also discussed her arthroscopic debridement if she continues to have any compromise of her activities. Presently she is not "ready for that. We'll plan to see back as necessary. Carotid consider cortisone injection over time  Follow-Up Instructions: Return if symptoms worsen or fail to improve.   Orders:  No orders of the defined types were placed in this encounter.  No orders of the defined types were placed in this encounter.     Procedures: No procedures performed   Clinical Data: No additional findings.   Subjective: Chief Complaint  Patient presents with  . Left Shoulder - Follow-up    Left shoulder follow up over MRI.   Chronic history of left shoulder pain. Ordered MRI scan. Here to review the scan. Results as above  HPI  Review of Systems   Objective: Vital Signs: BP (!) 141/73 (BP Location: Left Arm, Patient Position: Sitting, Cuff Size: Normal)   Pulse 87   Resp 14   Ht 5\' 5"  (1.651 m)   Wt 180 lb (81.6 kg)   LMP 02/28/1995   BMI 29.95 kg/m   Physical Exam  Ortho Exam awake alert  and oriented 3. Comfortable sitting. Able to raise her arm fully over her head and somewhat of an a circuitous arc. Minimally positive impingement testing. No popping or clicking. No pain along the acromioclavicular joint. Biceps intact. Skin intact. Good grip and release. No pain range of motion cervical spine  Specialty Comments:  No specialty comments available.  Imaging: No results found.   PMFS History: Patient Active Problem List   Diagnosis Date Noted  . Acute bronchitis 02/10/2014  . Smoker 11/12/2013  . Type II or unspecified type diabetes mellitus without mention of complication, uncontrolled 12/03/2012  . Elevated blood pressure reading without diagnosis of hypertension 09/04/2012  . OSA (obstructive sleep apnea) 07/27/2011  . Insomnia 07/27/2011  . Wheezing 07/27/2011  . Unspecified adverse effect of unspecified drug, medicinal and biological substance 07/04/2011  . Vitamin D deficiency 12/29/2010  . CERVICAL RADICULOPATHY, RIGHT 04/21/2010  . 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  . DIVERTICULOSIS, COLON 09/14/2008  . DYSMETABOLIC SYNDROME X 09/10/2007  . IBS 09/10/2007  . 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 DEPRESSION 02/04/2007  . MIGRAINE HEADACHE 02/04/2007  . STRESS INCONTINENCE 02/04/2007  . HISTOPLASMOSIS, HX OF 02/04/2007  . LIVER FUNCTION TESTS, ABNORMAL, HX OF 02/04/2007  . COLITIS, HX OF 02/04/2007   Past Medical History:  Diagnosis Date  . Arthritis   . Complication of anesthesia    woke up to soon.  reported "bad  gag reflex"     . Diabetes mellitus    Type 2  . Diverticulosis   . Dysmetabolic syndrome X   . Histoplasmosis 1971  . IBS (irritable bowel syndrome)   . Liver disease    PBC  . Migraines    ice pick headaches, Dr. Marylou Flesherohmier  . OSA (obstructive sleep apnea) 07/27/2011   Home sleep study 08/18/11 >> AHI 8.1, SpO2 low 74%.  pt stated she does not have sleep apnes  . Seasonal allergies   . UTI  symptoms     Family History  Problem Relation Age of Onset  . Leukemia Father        CLL  . Hyperlipidemia Mother   . Multiple sclerosis Mother   . Hypertension Mother   . Diabetes Maternal Grandmother        also had malignant lower neck mass of unknown etiology  . Breast cancer Sister        age 69  . Hypertension Sister        related to migraines  . Migraines Sister   . Stroke Maternal Grandfather 359  . Coronary artery disease Maternal Uncle   . Heart disease Maternal Aunt        porcine valve  . Heart disease Maternal Uncle        valvular disease    Past Surgical History:  Procedure Laterality Date  . BREAST LUMPECTOMY Right 2017   atypical lobular hyperplasia  . BREAST LUMPECTOMY WITH RADIOACTIVE SEED LOCALIZATION Right 07/22/2015   Procedure: BREAST LUMPECTOMY WITH RADIOACTIVE SEED LOCALIZATION;  Surgeon: Abigail Miyamotoouglas Blackman, MD;  Location: [facility] OR;  Service: General;  Laterality: Right;  . COLONOSCOPY     Tics; Dr Juanda ChanceBrodie  . DILATION AND CURETTAGE OF UTERUS    . Gastric Plication  1981  . KNEE ARTHROSCOPY Left 1990  . LASER ABLATION OF VASCULAR LESION  2009   Dr. Dorma RussellKraus, wart removed from nose  . RECTOCELE REPAIR  2003   with vaginal wall repair and sling  . ROTATOR CUFF REPAIR Right 2016  . TONSILLECTOMY     1982/1992  . VAGINAL HYSTERECTOMY  2003   Vag Hyst,BSO,Sling, posterior repair  . VULVA SURGERY     keratoses  . WRIST SURGERY Left 1988    tendon repair   Social History   Occupational History  . Occupation: Orthoptistweb design  Tobacco Use  . Smoking status: Current Every Day Smoker    Packs/day: 0.50    Years: 30.00    Pack years: 15.00    Types: Cigarettes  . Smokeless tobacco: Never Used  . Tobacco comment: Smoked 1968-present; up to 1 ppd  Substance and Sexual Activity  . Alcohol use: No  . Drug use: No  . Sexual activity: No    Birth control/protection: Surgical    Comment: 1st intercourse 67 yo-Fewer than 5 partners     Valeria BatmanPeter W Marcus Groll,  MD   Note - This record has been created using [employer] software.  Chart creation errors have been sought, but may not always  have been located. Such creation errors do not reflect on  the standard of medical care.

## 2017-05-01 DIAGNOSIS — E1140 Type 2 diabetes mellitus with diabetic neuropathy, unspecified: Secondary | ICD-10-CM | POA: Diagnosis not present

## 2017-05-01 DIAGNOSIS — Z79899 Other long term (current) drug therapy: Secondary | ICD-10-CM | POA: Diagnosis not present

## 2017-05-08 DIAGNOSIS — H2513 Age-related nuclear cataract, bilateral: Secondary | ICD-10-CM | POA: Diagnosis not present

## 2017-05-08 DIAGNOSIS — K589 Irritable bowel syndrome without diarrhea: Secondary | ICD-10-CM | POA: Diagnosis not present

## 2017-05-08 DIAGNOSIS — K219 Gastro-esophageal reflux disease without esophagitis: Secondary | ICD-10-CM | POA: Diagnosis not present

## 2017-05-08 DIAGNOSIS — F418 Other specified anxiety disorders: Secondary | ICD-10-CM | POA: Diagnosis not present

## 2017-05-08 DIAGNOSIS — Z0000 Encounter for general adult medical examination without abnormal findings: Secondary | ICD-10-CM | POA: Diagnosis not present

## 2017-05-25 ENCOUNTER — Encounter: Payer: Self-pay | Admitting: Gastroenterology

## 2017-07-11 NOTE — Progress Notes (Addendum)
[facility] Progress Note  Chief Complaint: Primary biliary cholangitis  Subjective  History:  This is a 67 year old woman diagnosed last year with PBC that responded quickly to ursodiol with quick normalization of LFTs.  She has had continued normal LFTs since starting ursodiol 600 mg twice daily.  Alexa Buchanan continues to feel generally well. The ursodiol is still a little difficult to take due to the size of tablets, but she manages. She will have some loose stool couple of days a week responds well to Imodium. Her appetite is good and her weight stable. She has had no periods of jaundiced and has no itching.  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  ROS: Cardiovascular:  no chest pain Respiratory: no dyspnea  The patient's Past Medical, Family and Social History were reviewed and are on file in the EMR.  Objective:  Med list reviewed  Current Outpatient Medications:  .  aspirin EC 81 MG tablet, Take 81 mg by mouth daily., Disp: , Rfl:  .  Calcium Carbonate-Vitamin D (CALCIUM + D PO), Take 1 tablet by mouth 2 (two) times daily. , Disp: , Rfl:  .  cetirizine (ZYRTEC) 10 MG tablet, Take 10 mg by mouth daily., Disp: , Rfl:  .  Cholecalciferol (VITAMIN D3) 2000 UNITS TABS, Take 2,000 Units by mouth 2 (two) times daily. , Disp: , Rfl:  .  gabapentin (NEURONTIN) 100 MG capsule, Take 100 mg by mouth 2 (two) times daily. , Disp: , Rfl:  .  glucose blood (ONE TOUCH ULTRA TEST) test strip, CHECK BLOOD SUGAR TWICE A DAY AS DIRECTED., Disp: 50 each, Rfl: 6 .  liraglutide (VICTOZA) 18 MG/3ML SOPN, Inject 1.2 mg into the skin daily., Disp: , Rfl:  .  metFORMIN (GLUCOPHAGE-XR) 500 MG 24 hr tablet, TAKE 1 TABLET ONCE DAILY., Disp: 90 tablet, Rfl: 1 .  Multiple Vitamin (MULTIVITAMIN) capsule, Take 1 capsule by mouth daily. Centrum Silver., Disp: , Rfl:  .  ONETOUCH DELICA LANCETS 40X MISC, CHECK BLOOD SUGAR ONCE A DAY., Disp: 100 each, Rfl: 3 .  oxybutynin (DITROPAN  XL) 15 MG 24 hr tablet, TAKE 1 TABLET DAILY., Disp: , Rfl:  .  trimethoprim (TRIMPEX) 100 MG tablet, Take 100 mg by mouth daily as needed (ACID REFLUX/ UPSET STOMACH). , Disp: , Rfl:  .  ursodiol (ACTIGALL) 300 MG capsule, TAKE (2) CAPSULES BY MOUTH TWICE DAILY., Disp: 360 capsule, Rfl: 1 .  zolpidem (AMBIEN) 10 MG tablet, Take 5 mg by mouth as needed for sleep. , Disp: , Rfl:   Current Facility-Administered Medications:  .  pneumococcal 23 valent vaccine (PNU-IMMUNE) injection 0.5 mL, 0.5 mL, Intramuscular, Tomorrow-1000, Hendricks Limes, MD   Vital signs in last 24 hrs: Vitals:   07/12/17 0820  BP: 116/64  Pulse: 84    Physical Exam  Well-appearing  HEENT: sclera anicteric, oral mucosa moist without lesions  Neck: supple, no thyromegaly, JVD or lymphadenopathy  Cardiac: RRR without murmurs, S1S2 heard, no peripheral edema  Pulm: clear to auscultation bilaterally, normal RR and effort noted  Abdomen: soft, no tenderness, with active bowel sounds. No guarding or palpable hepatosplenomegaly.  Skin; warm and dry, no jaundice or rash  Recent Labs:  Hepatic Function Latest Ref Rng & Units 03/13/2017 10/17/2016 07/06/2016  Total Protein 6.0 - 8.3 g/dL 7.5 7.0 6.9  Albumin 3.5 - 5.2 g/dL 4.3 3.9 4.2  AST 0 - 37 U/L _0 ALT 0 - 35 U/L 22 20  25  Alk Phosphatase 39 - 117 U/L 105 83 117  Total Bilirubin 0.2 - 1.2 mg/dL 0.4 0.4 0.4  Bilirubin, Direct 0.0 - 0.3 mg/dL 0.0 0.2 0.1      _0 @ Assessment: Encounter Diagnosis  Name Primary?  . 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 Alexa Buchanan tells me that she had lab work at a primary care visit in March, so we will obtain those see if LFTs were done. If so, then I will make arrangements for them to be checked again at a 6 month interval. If they're stable at that point, we will probably check again 6 months later and she can  see me about a year from now. I would certainly see her sooner as needed.   Total time 20 minutes, over half spent face-to-face with patient in counseling and coordination of care.   Nelida Meuse III   Addendum 07/16/17  Labs from Dr. Ardeth Perfect at [facility] dated 05/01/17: Normal CBC, AST,ALT, Alk Phos, T. Bili and lipids.  THS normal and Hgb A1c 7.5  Will recheck LFts in Sept 2019  - H. Danis

## 2017-07-12 ENCOUNTER — Encounter: Payer: Self-pay | Admitting: Gastroenterology

## 2017-07-12 ENCOUNTER — Ambulatory Visit: Payer: Medicare Other | Admitting: Gastroenterology

## 2017-07-12 VITALS — BP 116/64 | HR 84 | Ht 63.0 in | Wt 197.6 lb

## 2017-07-12 DIAGNOSIS — K743 Primary biliary cirrhosis: Secondary | ICD-10-CM | POA: Diagnosis not present

## 2017-07-12 NOTE — Patient Instructions (Signed)
If you are age 69 or older, your body mass index should be between 23-30. Your Body mass index is 35 kg/m. If this is out of the aforementioned range listed, please consider follow up with your Primary Care Provider.  If you are age 71 or younger, your body mass index should be between 19-25. Your Body mass index is 35 kg/m. If this is out of the aformentioned range listed, please consider follow up with your Primary Care Provider.   Thank you for choosing [facility]  Dr Amada Jupiter III

## 2017-07-16 ENCOUNTER — Telehealth: Payer: Self-pay | Admitting: Gastroenterology

## 2017-07-16 NOTE — Telephone Encounter (Signed)
Pt has been notified and aware.  

## 2017-07-16 NOTE — Telephone Encounter (Signed)
Recall for repeat labs done.

## 2017-07-16 NOTE — Telephone Encounter (Signed)
Please let Devanshi know I received and reviewed the labs from PCP dated march 5th.  They were normal, and I made a record of that in her office note.  Set an Epic recall for Sept 1st for Alexa Buchanan to have LFTs checked.  - HD

## 2017-07-30 DIAGNOSIS — D1801 Hemangioma of skin and subcutaneous tissue: Secondary | ICD-10-CM | POA: Diagnosis not present

## 2017-07-30 DIAGNOSIS — D2271 Melanocytic nevi of right lower limb, including hip: Secondary | ICD-10-CM | POA: Diagnosis not present

## 2017-07-30 DIAGNOSIS — L820 Inflamed seborrheic keratosis: Secondary | ICD-10-CM | POA: Diagnosis not present

## 2017-07-30 DIAGNOSIS — L738 Other specified follicular disorders: Secondary | ICD-10-CM | POA: Diagnosis not present

## 2017-08-17 DIAGNOSIS — K743 Primary biliary cirrhosis: Secondary | ICD-10-CM | POA: Diagnosis not present

## 2017-08-17 DIAGNOSIS — R918 Other nonspecific abnormal finding of lung field: Secondary | ICD-10-CM | POA: Diagnosis not present

## 2017-08-17 DIAGNOSIS — M19049 Primary osteoarthritis, unspecified hand: Secondary | ICD-10-CM | POA: Diagnosis not present

## 2017-08-17 DIAGNOSIS — E1169 Type 2 diabetes mellitus with other specified complication: Secondary | ICD-10-CM | POA: Diagnosis not present

## 2017-08-25 ENCOUNTER — Other Ambulatory Visit: Payer: Self-pay | Admitting: Gastroenterology

## 2017-10-02 ENCOUNTER — Encounter: Payer: Self-pay | Admitting: Gastroenterology

## 2017-10-09 ENCOUNTER — Other Ambulatory Visit: Payer: Self-pay | Admitting: Gynecology

## 2017-10-09 DIAGNOSIS — Z1231 Encounter for screening mammogram for malignant neoplasm of breast: Secondary | ICD-10-CM

## 2017-11-14 DIAGNOSIS — E119 Type 2 diabetes mellitus without complications: Secondary | ICD-10-CM | POA: Diagnosis not present

## 2017-11-21 ENCOUNTER — Other Ambulatory Visit: Payer: Self-pay | Admitting: Gastroenterology

## 2017-11-21 NOTE — Telephone Encounter (Signed)
Incoming refill request ursodiol 300mg  BID. Last seen 06-2017. Labs are due.

## 2017-11-23 ENCOUNTER — Ambulatory Visit
Admission: RE | Admit: 2017-11-23 | Discharge: 2017-11-23 | Disposition: A | Discharge status: Home / Self Care | Payer: Medicare Other | Source: Ambulatory Visit | Attending: Gynecology | Admitting: Gynecology

## 2017-11-23 DIAGNOSIS — Z1231 Encounter for screening mammogram for malignant neoplasm of breast: Secondary | ICD-10-CM

## 2017-11-29 ENCOUNTER — Encounter: Payer: Medicare Other | Admitting: Gynecology

## 2017-11-30 ENCOUNTER — Ambulatory Visit: Payer: Medicare Other | Admitting: Gynecology

## 2017-11-30 ENCOUNTER — Encounter: Payer: Self-pay | Admitting: Gynecology

## 2017-11-30 VITALS — BP 124/74 | Ht 63.0 in | Wt 198.0 lb

## 2017-11-30 DIAGNOSIS — Z01419 Encounter for gynecological examination (general) (routine) without abnormal findings: Secondary | ICD-10-CM | POA: Diagnosis not present

## 2017-11-30 DIAGNOSIS — N952 Postmenopausal atrophic vaginitis: Secondary | ICD-10-CM

## 2017-11-30 NOTE — Patient Instructions (Signed)
Call us if you decide to pursue genetic counseling and we will help you set up that appointment

## 2017-11-30 NOTE — Progress Notes (Signed)
    Shavonna Corella Petrilla September 03, 1950 [id number]        67 y.o.  [id number] for breast and pelvic exam.  Without gynecologic complaints.  Past medical history,surgical history, problem list, medications, allergies, family history and social history were all reviewed and documented as reviewed in the EPIC chart.  ROS:  Performed with pertinent positives and negatives included in the history, assessment and plan.   Additional significant findings : None   Exam: [name] assistant Vitals:   11/30/17 0834  BP: 124/74  Weight: 198 lb (89.8 kg)  Height: 5\' 3"  (1.6 m)   Body mass index is 35.07 kg/m.  General appearance:  Normal affect, orientation and appearance. Skin: Grossly normal HEENT: Without gross lesions.  No cervical or supraclavicular adenopathy. Thyroid normal.  Lungs:  Clear without wheezing, rales or rhonchi Cardiac: RR, without RMG Abdominal:  Soft, nontender, without masses, guarding, rebound, organomegaly or hernia Breasts:  Examined lying and sitting without masses, retractions, discharge or axillary adenopathy. Pelvic:  Ext, BUS, Vagina: With atrophic changes.  Adnexa: Without masses or tenderness    Anus and perineum: Normal   Rectovaginal: Normal sphincter tone without palpated masses or tenderness.    Assessment/Plan:  67 y.o. [id number] female for breast and pelvic exam.   1. Postmenopausal/atrophic genital changes.  Status post [facility] BSO sling for prolapse 2003.  No significant menopausal symptoms. 2. Pap smear 2015.  No Pap smear done today.  Per current screening guidelines we both agree to stop screening based on age and hysterectomy history.  No history of abnormal Pap smears. 3. Mammography 10/2017.  We revisited the issue of her sisters breast cancer diagnosed in her 27s.  No other family history of breast or ovarian cancer.  Somewhat unclear of the diagnoses for her sister as her sister refuses to talk about it but apparently received no post op treatment.  We again  discussed genetic linkage possibilities and I offered referral to genetic counselor to discuss whether genetic testing for the patient would be appropriate.  At this point the patient declines but states she wants to have it done and will call back when she is ready to proceed with this.  Breast exam normal today.  Continue with annual mammography next year. 4. Colonoscopy 2010.  Reported repeat interval 10 years. 5. DEXA 2018 normal.  Plan repeat DEXA at 5-year interval. 6. Health maintenance.  No routine lab work done as patient does this elsewhere.  Follow-up 1 year, sooner as needed.   Dara Lords MD, 9:03 AM 11/30/2017

## 2017-12-01 DIAGNOSIS — Z23 Encounter for immunization: Secondary | ICD-10-CM | POA: Diagnosis not present

## 2017-12-18 DIAGNOSIS — N3010 Interstitial cystitis (chronic) without hematuria: Secondary | ICD-10-CM | POA: Diagnosis not present

## 2017-12-18 DIAGNOSIS — Z87440 Personal history of urinary (tract) infections: Secondary | ICD-10-CM | POA: Diagnosis not present

## 2017-12-31 NOTE — Telephone Encounter (Signed)
No additional notes recorded °

## 2018-01-03 DIAGNOSIS — E1169 Type 2 diabetes mellitus with other specified complication: Secondary | ICD-10-CM | POA: Diagnosis not present

## 2018-01-03 DIAGNOSIS — R918 Other nonspecific abnormal finding of lung field: Secondary | ICD-10-CM | POA: Diagnosis not present

## 2018-01-03 DIAGNOSIS — R928 Other abnormal and inconclusive findings on diagnostic imaging of breast: Secondary | ICD-10-CM | POA: Diagnosis not present

## 2018-01-03 DIAGNOSIS — R58 Hemorrhage, not elsewhere classified: Secondary | ICD-10-CM | POA: Diagnosis not present

## 2018-04-01 DIAGNOSIS — D1801 Hemangioma of skin and subcutaneous tissue: Secondary | ICD-10-CM | POA: Diagnosis not present

## 2018-04-01 DIAGNOSIS — L820 Inflamed seborrheic keratosis: Secondary | ICD-10-CM | POA: Diagnosis not present

## 2018-04-05 DIAGNOSIS — R918 Other nonspecific abnormal finding of lung field: Secondary | ICD-10-CM | POA: Diagnosis not present

## 2018-04-05 DIAGNOSIS — R05 Cough: Secondary | ICD-10-CM | POA: Diagnosis not present

## 2018-04-05 DIAGNOSIS — E1169 Type 2 diabetes mellitus with other specified complication: Secondary | ICD-10-CM | POA: Diagnosis not present

## 2018-04-05 DIAGNOSIS — F418 Other specified anxiety disorders: Secondary | ICD-10-CM | POA: Diagnosis not present

## 2018-04-29 ENCOUNTER — Other Ambulatory Visit: Payer: Self-pay | Admitting: Gastroenterology

## 2018-04-29 NOTE — Telephone Encounter (Signed)
Recall has been placed for office follow up visit.

## 2018-04-29 NOTE — Telephone Encounter (Signed)
Alexa Buchanan last saw me in May 2019 for PBC. I just refilled her ursodiol (90 day supply).  No current follow up appointment.   Please arrange April/May office visit with me or set clinical reminder if schedule not out that far.  - HD

## 2018-06-21 ENCOUNTER — Encounter: Payer: Self-pay | Admitting: Gastroenterology

## 2018-07-26 ENCOUNTER — Other Ambulatory Visit: Payer: Self-pay | Admitting: Gastroenterology

## 2018-07-26 DIAGNOSIS — K743 Primary biliary cirrhosis: Secondary | ICD-10-CM

## 2018-07-26 NOTE — Telephone Encounter (Signed)
Refill request for ursodiol 300 mg, 2 po BID. Last seen 07-13-2018.

## 2018-07-29 NOTE — Telephone Encounter (Signed)
I refilled her medicine.  However, it has been a year since her last visit with me.  Please make arrangements for her to have a CBC, CMP, PT/INR drawn, and then a telemedicine visit with me shortly afterwards so I can review results with her at that time.

## 2018-07-29 NOTE — Telephone Encounter (Signed)
Left message for return call.

## 2018-07-30 ENCOUNTER — Telehealth: Payer: Self-pay | Admitting: Gastroenterology

## 2018-07-30 NOTE — Telephone Encounter (Signed)
Follow up scheduled for 08-19-2018. Labs have been ordered.

## 2018-07-30 NOTE — Telephone Encounter (Signed)
Pt return your call.

## 2018-08-01 ENCOUNTER — Other Ambulatory Visit (INDEPENDENT_AMBULATORY_CARE_PROVIDER_SITE_OTHER): Payer: Medicare Other

## 2018-08-01 DIAGNOSIS — K743 Primary biliary cirrhosis: Secondary | ICD-10-CM

## 2018-08-01 LAB — COMPREHENSIVE METABOLIC PANEL
ALT: 20 U/L (ref 0–35)
AST: 14 U/L (ref 0–37)
Albumin: 4 g/dL (ref 3.5–5.2)
Alkaline Phosphatase: 91 U/L (ref 39–117)
BUN: 18 mg/dL (ref 6–23)
CO2: 23 mEq/L (ref 19–32)
Calcium: 9.6 mg/dL (ref 8.4–10.5)
Chloride: 103 mEq/L (ref 96–112)
Creatinine, Ser: 0.8 mg/dL (ref 0.40–1.20)
GFR: 70.86 mL/min (ref >60.00)
Glucose, Bld: 165 mg/dL — ABNORMAL HIGH (ref 70–99)
Potassium: 4.2 mEq/L (ref 3.5–5.1)
Sodium: 139 mEq/L (ref 135–145)
Total Bilirubin: 0.3 mg/dL (ref 0.2–1.2)
Total Protein: 6.8 g/dL (ref 6.0–8.3)

## 2018-08-01 LAB — CBC WITH DIFFERENTIAL/PLATELET
Basophils Absolute: 0 10*3/uL (ref 0.0–0.1)
Basophils Relative: 0.4 % (ref 0.0–3.0)
Eosinophils Absolute: 0.2 10*3/uL (ref 0.0–0.7)
Eosinophils Relative: 2.5 % (ref 0.0–5.0)
HCT: 41.2 % (ref 36.0–46.0)
Hemoglobin: 14.2 g/dL (ref 12.0–15.0)
Lymphocytes Relative: 39.3 % (ref 12.0–46.0)
Lymphs Abs: 3.8 10*3/uL (ref 0.7–4.0)
MCHC: 34.5 g/dL (ref 30.0–36.0)
MCV: 93.7 fl (ref 78.0–100.0)
Monocytes Absolute: 0.6 10*3/uL (ref 0.1–1.0)
Monocytes Relative: 5.7 % (ref 3.0–12.0)
Neutro Abs: 5 10*3/uL (ref 1.4–7.7)
Neutrophils Relative %: 52.1 % (ref 43.0–77.0)
Platelets: 330 10*3/uL (ref 150.0–400.0)
RBC: 4.39 Mil/uL (ref 3.87–5.11)
RDW: 13.9 % (ref 11.5–15.5)
WBC: 9.6 10*3/uL (ref 4.0–10.5)

## 2018-08-01 LAB — PROTIME-INR
INR: 0.9 ratio (ref 0.8–1.0)
Prothrombin Time: 10 s (ref 9.6–13.1)

## 2018-08-02 ENCOUNTER — Ambulatory Visit: Payer: Medicare Other | Admitting: Gastroenterology

## 2018-08-19 ENCOUNTER — Ambulatory Visit (INDEPENDENT_AMBULATORY_CARE_PROVIDER_SITE_OTHER): Payer: Medicare Other | Admitting: Gastroenterology

## 2018-08-19 ENCOUNTER — Encounter: Payer: Self-pay | Admitting: Gastroenterology

## 2018-08-19 VITALS — Ht 65.0 in | Wt 190.0 lb

## 2018-08-19 DIAGNOSIS — K743 Primary biliary cirrhosis: Secondary | ICD-10-CM

## 2018-08-19 DIAGNOSIS — K5909 Other constipation: Secondary | ICD-10-CM

## 2018-08-19 NOTE — Progress Notes (Addendum)
This patient contacted our office requesting a physician telemedicine consultation regarding clinical questions and/or test results.   Participants on the conference : myself and patient   The patient consented to this consultation and was aware that a charge will be placed through their insurance.  I was in my office and the patient was at home   Encounter time:  Total time 22 minutes, with 19 minutes spent with patient on Doximity   _____________________________________________________________________________________________           [facility] Progress Note  Chief Complaint: PBC  Subjective  History: PBC - last seen May 2019 with LFTs having normalized on UDCA.  She has generally been feeling well.  Intermittent constipation since reduced physical activity during COVID restrictions last few months.  Denies rectal bleeding. Was not able to see primary care several months ago, so had recent labs with [place]. Last bone density study normal in October 2018. Denies chronic abdominal pain, dysphagia, odynophagia.  Anxious to see her grandchildren in [place], hopefully sometime in next several months.  ROS: Cardiovascular:  no chest pain Respiratory: no dyspnea  The patient's Past Medical, Family and Social History were reviewed and are on file in the EMR.  Objective:  Med list reviewed  Current Outpatient Medications:  .  aspirin EC 81 MG tablet, Take 81 mg by mouth daily., Disp: , Rfl:  .  Calcium Carbonate-Vitamin D (CALCIUM + D PO), Take 1 tablet by mouth 2 (two) times daily. , Disp: , Rfl:  .  cetirizine (ZYRTEC) 10 MG tablet, Take 10 mg by mouth daily., Disp: , Rfl:  .  Cholecalciferol (VITAMIN D3) 2000 UNITS TABS, Take 2,000 Units by mouth 2 (two) times daily. , Disp: , Rfl:  .  gabapentin (NEURONTIN) 100 MG capsule, Take 100 mg by mouth 2 (two) times daily. , Disp: , Rfl:  .  glucose blood (ONE TOUCH ULTRA TEST) test strip, CHECK BLOOD SUGAR TWICE A DAY AS  DIRECTED., Disp: 50 each, Rfl: 6 .  liraglutide (VICTOZA) 18 MG/3ML SOPN, Inject 1.2 mg into the skin daily., Disp: , Rfl:  .  metFORMIN (GLUCOPHAGE-XR) 500 MG 24 hr tablet, TAKE 1 TABLET ONCE DAILY., Disp: 90 tablet, Rfl: 1 .  Multiple Vitamin (MULTIVITAMIN) capsule, Take 1 capsule by mouth daily. Centrum Silver., Disp: , Rfl:  .  ONETOUCH DELICA LANCETS 20N MISC, CHECK BLOOD SUGAR ONCE A DAY., Disp: 100 each, Rfl: 3 .  oxybutynin (DITROPAN XL) 15 MG 24 hr tablet, TAKE 1 TABLET DAILY., Disp: , Rfl:  .  trimethoprim (TRIMPEX) 100 MG tablet, Take 100 mg by mouth daily as needed (ACID REFLUX/ UPSET STOMACH). , Disp: , Rfl:  .  ursodiol (ACTIGALL) 300 MG capsule, TAKE (2) CAPSULES BY MOUTH TWICE DAILY., Disp: 360 capsule, Rfl: 0 .  zolpidem (AMBIEN) 10 MG tablet, Take 5 mg by mouth as needed for sleep. , Disp: , Rfl:   Current Facility-Administered Medications:  .  pneumococcal 23 valent vaccine (PNU-IMMUNE) injection 0.5 mL, 0.5 mL, Intramuscular, Tomorrow-1000, Hopper, Darrick Penna, MD    Exam-virtual visit  She is well-appearing  Recent Labs:   CMP Latest Ref Rng & Units 08/01/2018 03/13/2017 10/17/2016  Glucose 70 - 99 mg/dL 165(H) - -  BUN 6 - 23 mg/dL 18 - -  Creatinine 0.40 - 1.20 mg/dL 0.80 - -  Sodium 135 - 145 mEq/L 139 - -  Potassium 3.5 - 5.1 mEq/L 4.2 - -  Chloride 96 - 112 mEq/L 103 - -  CO2 19 - 32 mEq/L 23 - -  Calcium 8.4 - 10.5 mg/dL 9.6 - -  Total Protein 6.0 - 8.3 g/dL 6.8 7.5 7.0  Total Bilirubin 0.2 - 1.2 mg/dL 0.3 0.4 0.4  Alkaline Phos 39 - 117 U/L 91 105 83  AST 0 - 37 U/L 14 15 15   ALT 0 - 35 U/L 20 22 20    CBC Latest Ref Rng & Units 08/01/2018 05/12/2016 07/19/2015  WBC 4.0 - 10.5 K/uL 9.6 8.5 10.7(H)  Hemoglobin 12.0 - 15.0 g/dL 16.114.2 09.612.7 04.514.0  Hematocrit 36.0 - 46.0 % 41.2 38.4 42.2  Platelets 150.0 - 400.0 K/uL 330.0 328 314   INR 0.9   @ASSESSMENTPLANBEGIN @ Assessment: Encounter Diagnoses  Name Primary?  . Primary biliary cholangitis ([facility]) Yes  .  Chronic constipation    She has done very well since the PBC diagnosis, with durable normalization of LFTs on ursodiol.  Recent change in bowel habits, likely change in schedule, reduce physical activity. However due for screening colonoscopy this year.(Last done by Dr. Juanda ChanceBrodie August 2010)  Should have repeat DXA late 2021  (3 yr interval) due to PBC.  Plan: Continue current medicines.  We will place lab recall for hepatic function panel in 6 months. We will contact her to schedule screening colonoscopy.   Charlie PitterHenry L Danis III

## 2018-08-21 ENCOUNTER — Telehealth: Payer: Self-pay

## 2018-08-21 NOTE — Telephone Encounter (Signed)
Patient declined to schedule a colonoscopy due to her 2 past experiences. She would like to talk to her PCP about going to a different facility for her colonoscopy.

## 2018-08-22 NOTE — Telephone Encounter (Signed)
Unfortunate, but understood - thanks for checking.

## 2018-09-27 DIAGNOSIS — E1169 Type 2 diabetes mellitus with other specified complication: Secondary | ICD-10-CM | POA: Diagnosis not present

## 2018-09-30 ENCOUNTER — Telehealth: Payer: Self-pay | Admitting: *Deleted

## 2018-09-30 NOTE — Telephone Encounter (Signed)
Thank you for letting me know - sorry to hear that.

## 2018-09-30 NOTE — Telephone Encounter (Signed)
FYI- Dr. Loletha Carrow, below is the copied and pasted patient advice request:    Alexa Buchanan,   So sorry for your previous experiences, [facility] is disappointed to lose you as a valued patient. You can contact Medical Records and ask for a Release of Information form. Their phone number is [phone], fax is [phone]. Address is [address], [place], [place], [address]rd floor.   I will also let Dr. Loletha Carrow know that you will be changing practices.   Mattison Stuckey RN,   ----- Message -----      From:Alexa Buchanan      Sent:09/30/2018  9:52 AM EDT        To:Doran Stabler, MD   Subject:Non-Urgent Medical Question  I want to move my medical records to another physican/practice. What are the steps to do this? Is there a form I need to complete and send in? If so, can you please send the form or attach it to your reply? +++ This has everything to do with the upcoming colonoscopy, not with Dr. Loletha Carrow, who I think is great! But after two awful experiences with prior colonoscopies, I need to make this change now. Thank you for your cooperation.

## 2018-10-01 DIAGNOSIS — D2272 Melanocytic nevi of left lower limb, including hip: Secondary | ICD-10-CM | POA: Diagnosis not present

## 2018-10-01 DIAGNOSIS — L820 Inflamed seborrheic keratosis: Secondary | ICD-10-CM | POA: Diagnosis not present

## 2018-10-01 DIAGNOSIS — L738 Other specified follicular disorders: Secondary | ICD-10-CM | POA: Diagnosis not present

## 2018-10-01 DIAGNOSIS — D2271 Melanocytic nevi of right lower limb, including hip: Secondary | ICD-10-CM | POA: Diagnosis not present

## 2018-10-04 DIAGNOSIS — K743 Primary biliary cirrhosis: Secondary | ICD-10-CM | POA: Diagnosis not present

## 2018-10-04 DIAGNOSIS — R82998 Other abnormal findings in urine: Secondary | ICD-10-CM | POA: Diagnosis not present

## 2018-10-04 DIAGNOSIS — E1140 Type 2 diabetes mellitus with diabetic neuropathy, unspecified: Secondary | ICD-10-CM | POA: Diagnosis not present

## 2018-10-04 DIAGNOSIS — Z0000 Encounter for general adult medical examination without abnormal findings: Secondary | ICD-10-CM | POA: Diagnosis not present

## 2018-10-04 DIAGNOSIS — E1169 Type 2 diabetes mellitus with other specified complication: Secondary | ICD-10-CM | POA: Diagnosis not present

## 2018-10-07 ENCOUNTER — Other Ambulatory Visit: Payer: Self-pay | Admitting: Internal Medicine

## 2018-10-07 DIAGNOSIS — Z1231 Encounter for screening mammogram for malignant neoplasm of breast: Secondary | ICD-10-CM

## 2018-10-18 ENCOUNTER — Other Ambulatory Visit: Payer: Self-pay | Admitting: Gastroenterology

## 2018-10-28 DIAGNOSIS — K743 Primary biliary cirrhosis: Secondary | ICD-10-CM | POA: Diagnosis not present

## 2018-10-28 DIAGNOSIS — K582 Mixed irritable bowel syndrome: Secondary | ICD-10-CM | POA: Diagnosis not present

## 2018-10-28 DIAGNOSIS — Z1210 Encounter for screening for malignant neoplasm of intestinal tract, unspecified: Secondary | ICD-10-CM | POA: Diagnosis not present

## 2018-11-09 DIAGNOSIS — Z23 Encounter for immunization: Secondary | ICD-10-CM | POA: Diagnosis not present

## 2018-11-28 ENCOUNTER — Ambulatory Visit
Admission: RE | Admit: 2018-11-28 | Discharge: 2018-11-28 | Disposition: A | Discharge status: Home / Self Care | Payer: Medicare Other | Source: Ambulatory Visit | Attending: Internal Medicine | Admitting: Internal Medicine

## 2018-11-28 ENCOUNTER — Other Ambulatory Visit: Payer: Self-pay

## 2018-11-28 DIAGNOSIS — Z1231 Encounter for screening mammogram for malignant neoplasm of breast: Secondary | ICD-10-CM

## 2018-12-04 ENCOUNTER — Encounter: Payer: Self-pay | Admitting: Gynecology

## 2018-12-24 DIAGNOSIS — Z1159 Encounter for screening for other viral diseases: Secondary | ICD-10-CM | POA: Diagnosis not present

## 2018-12-27 DIAGNOSIS — Z1211 Encounter for screening for malignant neoplasm of colon: Secondary | ICD-10-CM | POA: Diagnosis not present

## 2018-12-27 DIAGNOSIS — K5730 Diverticulosis of large intestine without perforation or abscess without bleeding: Secondary | ICD-10-CM | POA: Diagnosis not present

## 2019-01-13 ENCOUNTER — Other Ambulatory Visit: Payer: Self-pay | Admitting: Gastroenterology

## 2019-01-13 NOTE — Telephone Encounter (Signed)
Dr Loletha Carrow, this lady declined to schedule a colonoscopy and is in progress of changing GI practices. Please advise on request for medication refills.

## 2019-01-15 NOTE — Telephone Encounter (Signed)
Since she told us 3 months ago she was changing practices, I assumed she had already done so.  I do not want her to be without her medicine for this condition, so I have sent one last refill for 2 months, which should give her plenty of time to establish care with a new practice.  Please let her know.

## 2019-01-16 DIAGNOSIS — R3915 Urgency of urination: Secondary | ICD-10-CM | POA: Diagnosis not present

## 2019-01-16 DIAGNOSIS — R350 Frequency of micturition: Secondary | ICD-10-CM | POA: Diagnosis not present

## 2019-01-16 DIAGNOSIS — N3941 Urge incontinence: Secondary | ICD-10-CM | POA: Diagnosis not present

## 2019-01-16 DIAGNOSIS — N3010 Interstitial cystitis (chronic) without hematuria: Secondary | ICD-10-CM | POA: Diagnosis not present

## 2019-03-20 DIAGNOSIS — H524 Presbyopia: Secondary | ICD-10-CM | POA: Diagnosis not present

## 2019-03-25 ENCOUNTER — Ambulatory Visit: Payer: Medicare Other

## 2019-04-03 ENCOUNTER — Ambulatory Visit: Payer: Medicare Other | Attending: Internal Medicine

## 2019-04-03 DIAGNOSIS — Z23 Encounter for immunization: Secondary | ICD-10-CM | POA: Insufficient documentation

## 2019-04-03 NOTE — Progress Notes (Signed)
   Covid-19 Vaccination Clinic  Name:  MARGARETHA MAHAN    MRN: [id number] DOB: 06-28-1950  04/03/2019  Ms. Eberwein was observed post Covid-19 immunization for 15 minutes without incidence. She was provided with Vaccine Information Sheet and instruction to access the V-Safe system.   Ms. Fackrell was instructed to call 911 with any severe reactions post vaccine: Marland Kitchen Difficulty breathing  . Swelling of your face and throat  . A fast heartbeat  . A bad rash all over your body  . Dizziness and weakness    Immunizations Administered    Name Date Dose VIS Date Route   Pfizer COVID-19 Vaccine 04/03/2019  5:29 PM 0.3 mL 02/07/2019 Intramuscular   Manufacturer: [employer], [employer]   Lot: [id number]   NDC: [address]-7

## 2019-04-05 ENCOUNTER — Ambulatory Visit: Payer: Medicare Other

## 2019-04-22 ENCOUNTER — Other Ambulatory Visit: Payer: Self-pay

## 2019-04-23 ENCOUNTER — Encounter: Payer: Self-pay | Admitting: Obstetrics and Gynecology

## 2019-04-23 ENCOUNTER — Ambulatory Visit: Payer: Medicare Other | Admitting: Obstetrics and Gynecology

## 2019-04-23 VITALS — BP 130/76 | Ht 64.0 in | Wt 206.0 lb

## 2019-04-23 DIAGNOSIS — Z01419 Encounter for gynecological examination (general) (routine) without abnormal findings: Secondary | ICD-10-CM

## 2019-04-23 NOTE — Patient Instructions (Addendum)
Your next bone scan/DEXA would be due in 2023, but since your new diagnosis with GI if you need to have more frequent scans, please make an appointment with [place] for it sooner

## 2019-04-23 NOTE — Progress Notes (Signed)
Alexa Buchanan November 12, 1950 [id number]  SUBJECTIVE:  69 y.o. [id number] female for annual routine gynecologic exam and Pap smear. She has no gynecologic concerns.  Current Outpatient Medications  Medication Sig Dispense Refill  . aspirin EC 81 MG tablet Take 81 mg by mouth daily.    . Calcium Carbonate-Vitamin D (CALCIUM + D PO) Take 1 tablet by mouth 2 (two) times daily.     . cetirizine (ZYRTEC) 10 MG tablet Take 10 mg by mouth daily.    . Cholecalciferol (VITAMIN D3) 2000 UNITS TABS Take 2,000 Units by mouth 2 (two) times daily.     Marland Kitchen gabapentin (NEURONTIN) 100 MG capsule Take 100 mg by mouth 2 (two) times daily.     Marland Kitchen glucose blood (ONE TOUCH ULTRA TEST) test strip CHECK BLOOD SUGAR TWICE A DAY AS DIRECTED. 50 each 6  . liraglutide (VICTOZA) 18 MG/3ML SOPN Inject 1.2 mg into the skin daily.    . metFORMIN (GLUCOPHAGE-XR) 500 MG 24 hr tablet TAKE 1 TABLET ONCE DAILY. 90 tablet 1  . Multiple Vitamin (MULTIVITAMIN) capsule Take 1 capsule by mouth daily. Centrum Silver.    Glory Rosebush DELICA LANCETS 50P MISC CHECK BLOOD SUGAR ONCE A DAY. 100 each 3  . oxybutynin (DITROPAN XL) 15 MG 24 hr tablet TAKE 1 TABLET DAILY.    Marland Kitchen trimethoprim (TRIMPEX) 100 MG tablet Take 100 mg by mouth daily as needed (ACID REFLUX/ UPSET STOMACH).     Marland Kitchen ursodiol (ACTIGALL) 300 MG capsule TAKE (2) CAPSULES BY MOUTH TWICE DAILY. 240 capsule 0  . zolpidem (AMBIEN) 10 MG tablet Take 5 mg by mouth as needed for sleep.      Current Facility-Administered Medications  Medication Dose Route Frequency Provider Last Rate Last Admin  . pneumococcal 23 valent vaccine (PNU-IMMUNE) injection 0.5 mL  0.5 mL Intramuscular Tomorrow-1000 Hendricks Limes, MD       Allergies: Patient has no known allergies.  Patient's last menstrual period was 02/28/1995.  Past medical history,surgical history, problem list, medications, allergies, family history and social history were all reviewed and documented as reviewed in the EPIC  chart.  ROS:  Feeling well. No dyspnea or chest pain on exertion.  No abdominal pain, change in bowel habits, black or bloody stools.  No urinary tract symptoms. GYN ROS: normal menses, no abnormal bleeding, pelvic pain or discharge, no breast pain or new or enlarging lumps on self exam. No neurological complaints.    OBJECTIVE:  BP 130/76   Ht 5\' 4"  (1.626 m)   Wt 206 lb (93.4 kg)   LMP 02/28/1995   BMI 35.36 kg/m  The patient appears well, alert, oriented x 3, in no distress. ENT normal.  Neck supple. No cervical or supraclavicular adenopathy or thyromegaly.  Lungs are clear, good air entry, no wheezes, rhonchi or rales. S1 and S2 normal, no murmurs, regular rate and rhythm.  Abdomen soft without tenderness, guarding, mass or organomegaly.  Neurological is normal, no focal findings.  BREAST EXAM: breasts appear normal, no suspicious masses, no skin or nipple changes or axillary nodes  PELVIC EXAM: VULVA: normal appearing vulva with no masses, tenderness or lesions, VAGINA: normal appearing vagina with normal color and discharge, no lesions, CERVIX: surgically absent, UTERUS: surgically absent, vaginal cuff well healed, ADNEXA: no masses, RECTAL: normal rectal, no masses   Chaperone: Caryn Bee present during the examination  ASSESSMENT:  69 y.o. T4S5681 here for annual gynecologic exam  PLAN:   1.  Postmenopausal.  Status post [facility]  BSO with a urethral sling due to pelvic organ prolapse in 2003.  No significant menopausal symptoms. 2. Pap smear 09/2013.  No significant history of abnormal Pap smears.  She is comfortable discontinuing Pap smear surveillance based on age and prior hysterectomy. 3. Mammogram 11/2018.  Normal breast exam today.  She is reminded to schedule an annual mammogram this year when due. 4. Colonoscopy 2020.  Recommended that she follow up at the recommended interval.   5. DEXA 2018.  Next DEXA recommended at 5 year interval in 2023.  She says she has been  diagnosed with a condition by her GI doctor and they recommended a more frequent survey so I will let her arrange that with [place] per their recommendation.  6. Health maintenance.  No labs today as she normally has these completed with her primary care provider.   Return annually or sooner, prn.  Theresia Majors MD  04/23/19

## 2019-04-29 ENCOUNTER — Ambulatory Visit: Payer: Medicare Other | Attending: Internal Medicine

## 2019-04-29 DIAGNOSIS — Z23 Encounter for immunization: Secondary | ICD-10-CM | POA: Insufficient documentation

## 2019-04-29 NOTE — Progress Notes (Signed)
   Covid-19 Vaccination Clinic  Name:  Alexa Buchanan    MRN: [id number] DOB: 1950-09-15  04/29/2019  Alexa Buchanan was observed post Covid-19 immunization for 15 minutes without incident. She was provided with Vaccine Information Sheet and instruction to access the V-Safe system.   Alexa Buchanan was instructed to call 911 with any severe reactions post vaccine: Marland Kitchen Difficulty breathing  . Swelling of face and throat  . A fast heartbeat  . A bad rash all over body  . Dizziness and weakness   Immunizations Administered    Name Date Dose VIS Date Route   Pfizer COVID-19 Vaccine 04/29/2019  8:21 AM 0.3 mL 02/07/2019 Intramuscular   Manufacturer: [employer], [employer]   Lot: [id number]   NDC: [address]-1

## 2019-09-03 DIAGNOSIS — K743 Primary biliary cirrhosis: Secondary | ICD-10-CM | POA: Diagnosis not present

## 2019-09-11 ENCOUNTER — Other Ambulatory Visit: Payer: Self-pay | Admitting: Gastroenterology

## 2019-09-11 DIAGNOSIS — K224 Dyskinesia of esophagus: Secondary | ICD-10-CM | POA: Diagnosis not present

## 2019-09-11 DIAGNOSIS — K743 Primary biliary cirrhosis: Secondary | ICD-10-CM

## 2019-09-30 DIAGNOSIS — Z79899 Other long term (current) drug therapy: Secondary | ICD-10-CM | POA: Diagnosis not present

## 2019-09-30 DIAGNOSIS — E1169 Type 2 diabetes mellitus with other specified complication: Secondary | ICD-10-CM | POA: Diagnosis not present

## 2019-09-30 DIAGNOSIS — Z0000 Encounter for general adult medical examination without abnormal findings: Secondary | ICD-10-CM | POA: Diagnosis not present

## 2019-10-07 DIAGNOSIS — E1169 Type 2 diabetes mellitus with other specified complication: Secondary | ICD-10-CM | POA: Diagnosis not present

## 2019-10-07 DIAGNOSIS — N3281 Overactive bladder: Secondary | ICD-10-CM | POA: Diagnosis not present

## 2019-10-07 DIAGNOSIS — Z0000 Encounter for general adult medical examination without abnormal findings: Secondary | ICD-10-CM | POA: Diagnosis not present

## 2019-10-07 DIAGNOSIS — K743 Primary biliary cirrhosis: Secondary | ICD-10-CM | POA: Diagnosis not present

## 2019-10-07 DIAGNOSIS — R82998 Other abnormal findings in urine: Secondary | ICD-10-CM | POA: Diagnosis not present

## 2019-10-14 ENCOUNTER — Ambulatory Visit
Admission: RE | Admit: 2019-10-14 | Discharge: 2019-10-14 | Disposition: A | Discharge status: Home / Self Care | Payer: Medicare Other | Source: Ambulatory Visit | Attending: Gastroenterology | Admitting: Gastroenterology

## 2019-10-14 DIAGNOSIS — K7460 Unspecified cirrhosis of liver: Secondary | ICD-10-CM | POA: Diagnosis not present

## 2019-10-14 DIAGNOSIS — K760 Fatty (change of) liver, not elsewhere classified: Secondary | ICD-10-CM | POA: Diagnosis not present

## 2019-10-14 DIAGNOSIS — K743 Primary biliary cirrhosis: Secondary | ICD-10-CM | POA: Diagnosis not present

## 2019-10-14 DIAGNOSIS — N281 Cyst of kidney, acquired: Secondary | ICD-10-CM | POA: Diagnosis not present

## 2019-10-15 ENCOUNTER — Other Ambulatory Visit: Payer: Self-pay | Admitting: Internal Medicine

## 2019-10-15 DIAGNOSIS — Z0000 Encounter for general adult medical examination without abnormal findings: Secondary | ICD-10-CM

## 2019-10-24 DIAGNOSIS — L570 Actinic keratosis: Secondary | ICD-10-CM | POA: Diagnosis not present

## 2019-10-24 DIAGNOSIS — D2272 Melanocytic nevi of left lower limb, including hip: Secondary | ICD-10-CM | POA: Diagnosis not present

## 2019-10-24 DIAGNOSIS — L821 Other seborrheic keratosis: Secondary | ICD-10-CM | POA: Diagnosis not present

## 2019-10-24 DIAGNOSIS — D2271 Melanocytic nevi of right lower limb, including hip: Secondary | ICD-10-CM | POA: Diagnosis not present

## 2019-12-06 DIAGNOSIS — Z23 Encounter for immunization: Secondary | ICD-10-CM | POA: Diagnosis not present

## 2019-12-08 DIAGNOSIS — H2513 Age-related nuclear cataract, bilateral: Secondary | ICD-10-CM | POA: Diagnosis not present

## 2019-12-08 DIAGNOSIS — E119 Type 2 diabetes mellitus without complications: Secondary | ICD-10-CM | POA: Diagnosis not present

## 2019-12-08 DIAGNOSIS — H25013 Cortical age-related cataract, bilateral: Secondary | ICD-10-CM | POA: Diagnosis not present

## 2019-12-08 DIAGNOSIS — H18593 Other hereditary corneal dystrophies, bilateral: Secondary | ICD-10-CM | POA: Diagnosis not present

## 2019-12-09 ENCOUNTER — Ambulatory Visit
Admission: RE | Admit: 2019-12-09 | Discharge: 2019-12-09 | Disposition: A | Discharge status: Home / Self Care | Payer: Medicare Other | Source: Ambulatory Visit | Attending: Internal Medicine | Admitting: Internal Medicine

## 2019-12-09 ENCOUNTER — Other Ambulatory Visit: Payer: Self-pay

## 2019-12-09 DIAGNOSIS — Z0000 Encounter for general adult medical examination without abnormal findings: Secondary | ICD-10-CM

## 2019-12-09 DIAGNOSIS — Z1231 Encounter for screening mammogram for malignant neoplasm of breast: Secondary | ICD-10-CM | POA: Diagnosis not present

## 2019-12-13 ENCOUNTER — Ambulatory Visit: Payer: Medicare Other | Attending: Internal Medicine

## 2019-12-13 DIAGNOSIS — Z23 Encounter for immunization: Secondary | ICD-10-CM

## 2019-12-13 NOTE — Progress Notes (Signed)
° °  Covid-19 Vaccination Clinic  Name:  ALIZEE MAPLE    MRN: [id number] DOB: [birth year]  12/13/2019  Ms. Lascola was observed post Covid-19 immunization for 15 minutes without incident. She was provided with Vaccine Information Sheet and instruction to access the V-Safe system.   Ms. Poudrier was instructed to call 911 with any severe reactions post vaccine:  Difficulty breathing   Swelling of face and throat   A fast heartbeat   A bad rash all over body   Dizziness and weakness

## 2019-12-31 DIAGNOSIS — H25811 Combined forms of age-related cataract, right eye: Secondary | ICD-10-CM | POA: Diagnosis not present

## 2019-12-31 DIAGNOSIS — H2511 Age-related nuclear cataract, right eye: Secondary | ICD-10-CM | POA: Diagnosis not present

## 2020-01-06 DIAGNOSIS — H25042 Posterior subcapsular polar age-related cataract, left eye: Secondary | ICD-10-CM | POA: Diagnosis not present

## 2020-01-06 DIAGNOSIS — H2512 Age-related nuclear cataract, left eye: Secondary | ICD-10-CM | POA: Diagnosis not present

## 2020-01-06 DIAGNOSIS — H25012 Cortical age-related cataract, left eye: Secondary | ICD-10-CM | POA: Diagnosis not present

## 2020-01-13 DIAGNOSIS — N3941 Urge incontinence: Secondary | ICD-10-CM | POA: Diagnosis not present

## 2020-01-13 DIAGNOSIS — N3010 Interstitial cystitis (chronic) without hematuria: Secondary | ICD-10-CM | POA: Diagnosis not present

## 2020-01-13 DIAGNOSIS — Z87440 Personal history of urinary (tract) infections: Secondary | ICD-10-CM | POA: Diagnosis not present

## 2020-01-28 DIAGNOSIS — H2512 Age-related nuclear cataract, left eye: Secondary | ICD-10-CM | POA: Diagnosis not present

## 2020-01-28 DIAGNOSIS — H25012 Cortical age-related cataract, left eye: Secondary | ICD-10-CM | POA: Diagnosis not present

## 2020-01-28 DIAGNOSIS — H25812 Combined forms of age-related cataract, left eye: Secondary | ICD-10-CM | POA: Diagnosis not present

## 2020-01-28 DIAGNOSIS — H25042 Posterior subcapsular polar age-related cataract, left eye: Secondary | ICD-10-CM | POA: Diagnosis not present

## 2020-02-06 DIAGNOSIS — R059 Cough, unspecified: Secondary | ICD-10-CM | POA: Diagnosis not present

## 2020-02-06 DIAGNOSIS — F418 Other specified anxiety disorders: Secondary | ICD-10-CM | POA: Diagnosis not present

## 2020-02-06 DIAGNOSIS — E1140 Type 2 diabetes mellitus with diabetic neuropathy, unspecified: Secondary | ICD-10-CM | POA: Diagnosis not present

## 2020-02-12 DIAGNOSIS — Z974 Presence of external hearing-aid: Secondary | ICD-10-CM | POA: Diagnosis not present

## 2020-02-12 DIAGNOSIS — H93A2 Pulsatile tinnitus, left ear: Secondary | ICD-10-CM | POA: Insufficient documentation

## 2020-02-12 DIAGNOSIS — H903 Sensorineural hearing loss, bilateral: Secondary | ICD-10-CM | POA: Diagnosis not present

## 2020-03-12 DIAGNOSIS — H93A2 Pulsatile tinnitus, left ear: Secondary | ICD-10-CM | POA: Diagnosis not present

## 2020-03-12 DIAGNOSIS — H903 Sensorineural hearing loss, bilateral: Secondary | ICD-10-CM | POA: Diagnosis not present

## 2020-04-06 DIAGNOSIS — I6523 Occlusion and stenosis of bilateral carotid arteries: Secondary | ICD-10-CM | POA: Diagnosis not present

## 2020-04-23 ENCOUNTER — Encounter: Payer: Medicare Other | Admitting: Obstetrics and Gynecology

## 2020-04-28 DIAGNOSIS — K743 Primary biliary cirrhosis: Secondary | ICD-10-CM | POA: Diagnosis not present

## 2020-04-29 ENCOUNTER — Other Ambulatory Visit: Payer: Self-pay

## 2020-04-29 ENCOUNTER — Encounter: Payer: Self-pay | Admitting: Obstetrics and Gynecology

## 2020-04-29 ENCOUNTER — Ambulatory Visit (INDEPENDENT_AMBULATORY_CARE_PROVIDER_SITE_OTHER): Payer: Medicare Other | Admitting: Obstetrics and Gynecology

## 2020-04-29 ENCOUNTER — Telehealth: Payer: Self-pay | Admitting: *Deleted

## 2020-04-29 VITALS — BP 122/78 | HR 80 | Ht 63.5 in | Wt 197.0 lb

## 2020-04-29 DIAGNOSIS — N644 Mastodynia: Secondary | ICD-10-CM | POA: Diagnosis not present

## 2020-04-29 DIAGNOSIS — Z01419 Encounter for gynecological examination (general) (routine) without abnormal findings: Secondary | ICD-10-CM

## 2020-04-29 DIAGNOSIS — Z9189 Other specified personal risk factors, not elsewhere classified: Secondary | ICD-10-CM | POA: Diagnosis not present

## 2020-04-29 NOTE — Progress Notes (Signed)
Alexa Buchanan 1950/08/23 [id number]  SUBJECTIVE:  70 y.o. [id number] female for annual routine gynecologic exam and Pap smear.  In the last week she has had some intermittent mild left-sided breast tenderness on the outer area of the breast.  Indicates no changes to physical activity or bra type/use.  No nipple drainage.  Known dense breast tissue. She has no other gynecologic concerns.  Current Outpatient Medications  Medication Sig Dispense Refill  . aspirin EC 81 MG tablet Take 81 mg by mouth daily.    . Calcium Carbonate-Vitamin D (CALCIUM + D PO) Take 1 tablet by mouth 2 (two) times daily.    . cetirizine (ZYRTEC) 10 MG tablet Take 10 mg by mouth daily.    . Cholecalciferol (VITAMIN D3) 2000 UNITS TABS Take 2,000 Units by mouth 2 (two) times daily.    Marland Kitchen gabapentin (NEURONTIN) 100 MG capsule Take 100 mg by mouth 2 (two) times daily.     Marland Kitchen glucose blood (ONE TOUCH ULTRA TEST) test strip CHECK BLOOD SUGAR TWICE A DAY AS DIRECTED. 50 each 6  . liraglutide (VICTOZA) 18 MG/3ML SOPN Inject 1.2 mg into the skin daily.    . metFORMIN (GLUCOPHAGE-XR) 500 MG 24 hr tablet TAKE 1 TABLET ONCE DAILY. 90 tablet 1  . Multiple Vitamin (MULTIVITAMIN) capsule Take 1 capsule by mouth daily. Centrum Silver.    [name] DELICA LANCETS 33G MISC CHECK BLOOD SUGAR ONCE A DAY. 100 each 3  . oxybutynin (DITROPAN XL) 15 MG 24 hr tablet TAKE 1 TABLET DAILY.    Marland Kitchen trimethoprim (TRIMPEX) 100 MG tablet Take 100 mg by mouth daily as needed (ACID REFLUX/ UPSET STOMACH).     Marland Kitchen ursodiol (ACTIGALL) 300 MG capsule TAKE (2) CAPSULES BY MOUTH TWICE DAILY. 240 capsule 0  . zolpidem (AMBIEN) 10 MG tablet Take 5 mg by mouth as needed for sleep.      Current Facility-Administered Medications  Medication Dose Route Frequency Provider Last Rate Last Admin  . pneumococcal 23 valent vaccine (PNU-IMMUNE) injection 0.5 mL  0.5 mL Intramuscular Tomorrow-1000 Pecola Lawless, MD       Allergies: Patient has no known allergies.   Patient's last menstrual period was 02/28/1995.  Past medical history,surgical history, problem list, medications, allergies, family history and social history were all reviewed and documented as reviewed in the EPIC chart.  ROS: Pertinent positives and negatives as reviewed in HPI   OBJECTIVE:  BP 122/78 (BP Location: Left Arm, Patient Position: Sitting, Cuff Size: Normal)   Pulse 80   Ht 5' 3.5" (1.613 m)   Wt 197 lb (89.4 kg)   LMP 02/28/1995   BMI 34.35 kg/m  The patient appears well, alert, oriented x 3, in no distress.  BREAST EXAM: breasts appear normal, no suspicious masses, no skin or nipple changes or axillary nodes, mild tenderness to palpation over the outer quadrant of the left breast, but no discrete mass or abnormality  PELVIC EXAM: VULVA: normal appearing vulva with trophic change, no masses, tenderness or lesions, VAGINA: normal appearing vagina with trophic change, normal color and discharge, no lesions, CERVIX: surgically absent, UTERUS: surgically absent, vaginal cuff normal, ADNEXA: no masses, RECTAL: Declined  Chaperone: Alexa Buchanan present during the examination  ASSESSMENT:  [name] here for annual gynecologic exam  PLAN:   1.  Postmenopausal.  Status post TVH BSO with a urethral sling due to pelvic organ prolapse in 2003.  No significant menopausal symptoms. 2. Pap smear 09/2013.  No significant history  of abnormal Pap smears.  She is comfortable discontinuing Pap smear surveillance based on age and prior hysterectomy. 3. Mammogram 11/2019.  Has had some left-sided breast tenderness addition to on palpation during the breast exam today.  She will keep an eye on the symptoms and if still persistent after 1 week she agrees to call the office and we will set up a left diagnostic mammogram.  Staff message is also sent to notify them of this plan.  The patient is aware I will only be in the office through the end of the week so she knows to make sure she  requests follow-up on this issue (or anything else) if needed with one of my partners.   4. Colonoscopy 2020.  She is to follow up at the recommended interval per her GI specialist 5. DEXA 2018.  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  Test is ordered, she may schedule when ready.   6. Health maintenance.  No labs today as she normally has these completed elsewhere.  Return annually or sooner, prn.  Alexa Majors MD  04/29/20

## 2020-04-29 NOTE — Telephone Encounter (Signed)
-----   Message from Theresia Majors, MD sent at 04/29/2020  8:50 AM EST ----- Regarding: Diagnostic left mammogram This patient is having tenderness in the left breast.  She will monitor for 1 more week and if still persistent symptoms then I instructed her to call the office and we will schedule a diagnostic left mammogram.  Thank you

## 2020-04-29 NOTE — Telephone Encounter (Signed)
Will wait to hear from patient if problem still continues.

## 2020-06-08 DIAGNOSIS — F418 Other specified anxiety disorders: Secondary | ICD-10-CM | POA: Diagnosis not present

## 2020-06-08 DIAGNOSIS — E1140 Type 2 diabetes mellitus with diabetic neuropathy, unspecified: Secondary | ICD-10-CM | POA: Diagnosis not present

## 2020-06-08 DIAGNOSIS — K743 Primary biliary cirrhosis: Secondary | ICD-10-CM | POA: Diagnosis not present

## 2020-06-08 DIAGNOSIS — R059 Cough, unspecified: Secondary | ICD-10-CM | POA: Diagnosis not present

## 2020-06-22 ENCOUNTER — Other Ambulatory Visit: Payer: Self-pay

## 2020-06-22 ENCOUNTER — Ambulatory Visit (INDEPENDENT_AMBULATORY_CARE_PROVIDER_SITE_OTHER): Payer: Medicare Other

## 2020-06-22 ENCOUNTER — Other Ambulatory Visit: Payer: Self-pay | Admitting: Obstetrics and Gynecology

## 2020-06-22 DIAGNOSIS — Z780 Asymptomatic menopausal state: Secondary | ICD-10-CM

## 2020-06-22 DIAGNOSIS — Z13820 Encounter for screening for osteoporosis: Secondary | ICD-10-CM

## 2020-06-22 DIAGNOSIS — Z01419 Encounter for gynecological examination (general) (routine) without abnormal findings: Secondary | ICD-10-CM

## 2020-06-22 DIAGNOSIS — Z9189 Other specified personal risk factors, not elsewhere classified: Secondary | ICD-10-CM

## 2020-08-27 DIAGNOSIS — E119 Type 2 diabetes mellitus without complications: Secondary | ICD-10-CM | POA: Diagnosis not present

## 2020-08-27 DIAGNOSIS — H26493 Other secondary cataract, bilateral: Secondary | ICD-10-CM | POA: Diagnosis not present

## 2020-08-27 DIAGNOSIS — H04123 Dry eye syndrome of bilateral lacrimal glands: Secondary | ICD-10-CM | POA: Diagnosis not present

## 2020-09-17 ENCOUNTER — Other Ambulatory Visit (HOSPITAL_BASED_OUTPATIENT_CLINIC_OR_DEPARTMENT_OTHER): Payer: Self-pay

## 2020-09-17 ENCOUNTER — Ambulatory Visit: Payer: Medicare Other | Attending: Internal Medicine

## 2020-09-17 DIAGNOSIS — Z23 Encounter for immunization: Secondary | ICD-10-CM

## 2020-09-17 MED ORDER — PFIZER-BIONT COVID-19 VAC-TRIS 30 MCG/0.3ML IM SUSP
INTRAMUSCULAR | 0 refills | Status: DC
  Filled 2020-09-17: qty 0.3, 1d supply, fill #0

## 2020-09-17 NOTE — Progress Notes (Signed)
   Covid-19 Vaccination Clinic  Name:  Alexa Buchanan    MRN: [id number] DOB: 26-Oct-1950  09/17/2020  Alexa Buchanan was observed post Covid-19 immunization for 15 minutes without incident. She was provided with Vaccine Information Sheet and instruction to access the V-Safe system.   Alexa Buchanan was instructed to call 911 with any severe reactions post vaccine: Difficulty breathing  Swelling of face and throat  A fast heartbeat  A bad rash all over body  Dizziness and weakness   Immunizations Administered     Name Date Dose VIS Date Route   PFIZER Comrnaty(Gray TOP) Covid-19 Vaccine 09/17/2020  1:26 PM 0.3 mL 02/05/2020 Intramuscular   Manufacturer: [employer], [employer]   Lot: [id number]   NDC: [phone]

## 2020-10-20 DIAGNOSIS — Z0000 Encounter for general adult medical examination without abnormal findings: Secondary | ICD-10-CM | POA: Diagnosis not present

## 2020-10-20 DIAGNOSIS — Z79899 Other long term (current) drug therapy: Secondary | ICD-10-CM | POA: Diagnosis not present

## 2020-10-20 DIAGNOSIS — E1140 Type 2 diabetes mellitus with diabetic neuropathy, unspecified: Secondary | ICD-10-CM | POA: Diagnosis not present

## 2020-10-22 DIAGNOSIS — Z961 Presence of intraocular lens: Secondary | ICD-10-CM | POA: Diagnosis not present

## 2020-10-22 DIAGNOSIS — H04123 Dry eye syndrome of bilateral lacrimal glands: Secondary | ICD-10-CM | POA: Diagnosis not present

## 2020-10-22 DIAGNOSIS — H26493 Other secondary cataract, bilateral: Secondary | ICD-10-CM | POA: Diagnosis not present

## 2020-10-26 ENCOUNTER — Other Ambulatory Visit: Payer: Self-pay | Admitting: Internal Medicine

## 2020-10-26 DIAGNOSIS — Z1231 Encounter for screening mammogram for malignant neoplasm of breast: Secondary | ICD-10-CM

## 2020-10-27 ENCOUNTER — Other Ambulatory Visit: Payer: Self-pay | Admitting: Internal Medicine

## 2020-10-27 DIAGNOSIS — Z0000 Encounter for general adult medical examination without abnormal findings: Secondary | ICD-10-CM

## 2020-10-27 DIAGNOSIS — E1169 Type 2 diabetes mellitus with other specified complication: Secondary | ICD-10-CM | POA: Diagnosis not present

## 2020-11-25 ENCOUNTER — Ambulatory Visit
Admission: RE | Admit: 2020-11-25 | Discharge: 2020-11-25 | Disposition: A | Discharge status: Home / Self Care | Payer: Medicare Other | Source: Ambulatory Visit | Attending: Internal Medicine | Admitting: Internal Medicine

## 2020-11-25 DIAGNOSIS — Z0000 Encounter for general adult medical examination without abnormal findings: Secondary | ICD-10-CM

## 2020-11-25 DIAGNOSIS — F17210 Nicotine dependence, cigarettes, uncomplicated: Secondary | ICD-10-CM | POA: Diagnosis not present

## 2020-11-29 DIAGNOSIS — D2261 Melanocytic nevi of right upper limb, including shoulder: Secondary | ICD-10-CM | POA: Diagnosis not present

## 2020-11-29 DIAGNOSIS — D2262 Melanocytic nevi of left upper limb, including shoulder: Secondary | ICD-10-CM | POA: Diagnosis not present

## 2020-11-29 DIAGNOSIS — D2371 Other benign neoplasm of skin of right lower limb, including hip: Secondary | ICD-10-CM | POA: Diagnosis not present

## 2020-11-29 DIAGNOSIS — L738 Other specified follicular disorders: Secondary | ICD-10-CM | POA: Diagnosis not present

## 2020-11-29 DIAGNOSIS — L821 Other seborrheic keratosis: Secondary | ICD-10-CM | POA: Diagnosis not present

## 2020-12-10 ENCOUNTER — Telehealth: Payer: Self-pay | Admitting: *Deleted

## 2020-12-10 NOTE — Telephone Encounter (Signed)
Patient reports internal vaginal itching and burning for 2-3 weeks. Has been using an old Rx of Premarin vaginal cream and mupirocin, no relief. Denies vaginal odor, d/c or urinary symptoms.   Last AEX with JK in 04/2020.   Advised OV recommended for further evaluation, patient agreeable. OV scheduled for 10/17 at 0900 with Dr. Edward Jolly.   Routing to provider for final review. Patient is agreeable to disposition. Will close encounter.

## 2020-12-13 ENCOUNTER — Other Ambulatory Visit: Payer: Self-pay

## 2020-12-13 ENCOUNTER — Ambulatory Visit: Payer: Medicare Other | Admitting: Obstetrics and Gynecology

## 2020-12-13 ENCOUNTER — Encounter: Payer: Self-pay | Admitting: Obstetrics and Gynecology

## 2020-12-13 VITALS — BP 122/84 | HR 84 | Wt 188.0 lb

## 2020-12-13 DIAGNOSIS — N760 Acute vaginitis: Secondary | ICD-10-CM | POA: Diagnosis not present

## 2020-12-13 LAB — WET PREP FOR TRICH, YEAST, CLUE

## 2020-12-13 MED ORDER — FLUCONAZOLE 150 MG PO TABS: 150.0000 mg | ORAL_TABLET | Freq: Once | ORAL | 0 refills | Status: AC

## 2020-12-13 MED ORDER — NYSTATIN-TRIAMCINOLONE 100000-0.1 UNIT/GM-% EX OINT: 1.0000 "application " | TOPICAL_OINTMENT | Freq: Two times a day (BID) | CUTANEOUS | 0 refills | Status: DC

## 2020-12-13 NOTE — Patient Instructions (Signed)

## 2020-12-13 NOTE — Progress Notes (Signed)
GYNECOLOGY  VISIT   HPI: 70 y.o.   Married  Caucasian  female   [phone] with Patient's last menstrual period was 02/28/1995.   here for internal vaginal irritation/sensitivity.  States she has an old prescription from Dr. Tonie Griffith time.  She received an Rx for Premarin cream and Mupirocin.  She used both of these.  Mupirocin started 3 weeks ago and then the Premarin 4 - 5 days later. Symptoms improved noticeable over the weekend.  She last used cream in the vagina yesterday.   No discharge.  No odor.  Hard to sit.  Uncomfortable.  Took a Z pack in June, 2022.   Second concern of keratosis.  Wants be to check this.   Knows Dr. Cammie Mcgee for many years.  She sees Dr. Amy Swaziland except for vulvar skin issues.  Her A1C was 6.1 at last visit about 1 - 2 months ago.   GYNECOLOGIC HISTORY: Patient's last menstrual period was 02/28/1995. Contraception:  PMP Menopausal hormone therapy:  none Last mammogram: 12-09-19 3D/Neg/BiRads1 Last pap smear:  10-16-13 Neg, 10-06-10 Neg        OB History     Gravida  3   Para  2   Term  2   Preterm      AB  1   Living  2      SAB      IAB      Ectopic      Multiple      Live Births                 Patient Active Problem List   Diagnosis Date Noted   Acute bronchitis 02/10/2014   Smoker 11/12/2013   Type II or unspecified type diabetes mellitus without mention of complication, uncontrolled 12/03/2012   Elevated blood pressure reading without diagnosis of hypertension 09/04/2012   OSA (obstructive sleep apnea) 07/27/2011   Insomnia 07/27/2011   Wheezing 07/27/2011   Unspecified adverse effect of unspecified drug, medicinal and biological substance 07/04/2011   Vitamin D deficiency 12/29/2010   CERVICAL RADICULOPATHY, RIGHT 04/21/2010   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   DIVERTICULOSIS, COLON 09/14/2008   DYSMETABOLIC SYNDROME X 09/10/2007   IBS 09/10/2007   Uncontrolled secondary diabetes with peripheral  neuropathy 02/04/2007   DEPRESSION 02/04/2007   MIGRAINE HEADACHE 02/04/2007   STRESS INCONTINENCE 02/04/2007   HISTOPLASMOSIS, HX OF 02/04/2007   LIVER FUNCTION TESTS, ABNORMAL, HX OF 02/04/2007   COLITIS, HX OF 02/04/2007    Past Medical History:  Diagnosis Date   Arthritis    Complication of anesthesia    woke up to soon.  reported "bad gag reflex"      Diabetes mellitus    Type 2   Diverticulosis    Dysmetabolic syndrome X    Histoplasmosis 1971   IBS (irritable bowel syndrome)    Liver disease    PBC   Migraines    ice pick headaches, Dr. Marylou Flesher   OSA (obstructive sleep apnea) 07/27/2011   Home sleep study 08/18/11 >> AHI 8.1, SpO2 low 74%.  pt stated she does not have sleep apnes   Seasonal allergies    UTI symptoms     Past Surgical History:  Procedure Laterality Date   BREAST EXCISIONAL BIOPSY     BREAST LUMPECTOMY WITH RADIOACTIVE SEED LOCALIZATION Right 07/22/2015   Procedure: BREAST LUMPECTOMY WITH RADIOACTIVE SEED LOCALIZATION;  Surgeon: Abigail Miyamoto, MD;  Location: [facility];  Service: General;  Laterality: Right;   COLONOSCOPY  Tics; Dr Juanda Chance   DILATION AND CURETTAGE OF UTERUS     Gastric Plication  1981   KNEE ARTHROSCOPY Left 1990   LASER ABLATION OF VASCULAR LESION  2009   Dr. Dorma Russell, wart removed from nose   RECTOCELE REPAIR  2003   with vaginal wall repair and sling   ROTATOR CUFF REPAIR Right 2016   TONSILLECTOMY     1982/1992   VAGINAL HYSTERECTOMY  2003   Vag Hyst,BSO,Sling, posterior repair   VULVA SURGERY     keratoses   WRIST SURGERY Left 1988    tendon repair    Current Outpatient Medications  Medication Sig Dispense Refill   aspirin EC 81 MG tablet Take 81 mg by mouth daily.     Calcium Carbonate-Vitamin D (CALCIUM + D PO) Take 1 tablet by mouth 2 (two) times daily.     cetirizine (ZYRTEC) 10 MG tablet Take 10 mg by mouth daily.     Cholecalciferol (VITAMIN D3) 2000 UNITS TABS Take 2,000 Units by mouth 2 (two) times daily.      gabapentin (NEURONTIN) 100 MG capsule Take 100 mg by mouth 2 (two) times daily.      glucose blood (ONE TOUCH ULTRA TEST) test strip CHECK BLOOD SUGAR TWICE A DAY AS DIRECTED. 50 each 6   HYDROcodone-acetaminophen (NORCO/VICODIN) 5-325 MG tablet Take 1 tablet by mouth every 6 (six) hours as needed.     JARDIANCE 10 MG TABS tablet Take 10 mg by mouth daily.     metFORMIN (GLUCOPHAGE-XR) 500 MG 24 hr tablet TAKE 1 TABLET ONCE DAILY. 90 tablet 1   Multiple Vitamin (MULTIVITAMIN) capsule Take 1 capsule by mouth daily. Centrum Silver.     oxybutynin (DITROPAN XL) 15 MG 24 hr tablet TAKE 1 TABLET DAILY.     ursodiol (ACTIGALL) 300 MG capsule TAKE (2) CAPSULES BY MOUTH TWICE DAILY. 240 capsule 0   zolpidem (AMBIEN) 10 MG tablet Take 5 mg by mouth as needed for sleep.      Current Facility-Administered Medications  Medication Dose Route Frequency Provider Last Rate Last Admin   pneumococcal 23 valent vaccine (PNU-IMMUNE) injection 0.5 mL  0.5 mL Intramuscular Tomorrow-1000 Pecola Lawless, MD         ALLERGIES: Patient has no known allergies.  Family History  Problem Relation Age of Onset   Leukemia Father        CLL   Hyperlipidemia Mother    Multiple sclerosis Mother    Hypertension Mother    Diabetes Maternal Grandmother        also had malignant lower neck mass of unknown etiology   Breast cancer Sister        age 36   Hypertension Sister        related to migraines   Migraines Sister    Stroke Maternal Grandfather 40   Heart disease Maternal Aunt        porcine valve   Heart disease Maternal Uncle        valvular disease   Colon cancer Neg Hx    Stomach cancer Neg Hx    Pancreatic cancer Neg Hx    Esophageal cancer Neg Hx     Social History   Socioeconomic History   Marital status: Married    Spouse name: Not on file   Number of children: 2   Years of education: Not on file   Highest education level: Not on file  Occupational History   Occupation: Orthoptist  Tobacco Use   Smoking status: Every Day    Packs/day: 0.50    Years: 30.00    Pack years: 15.00    Types: Cigarettes   Smokeless tobacco: Never   Tobacco comments:    Smoked 1968-present; up to 1 ppd  Vaping Use   Vaping Use: Former  Substance and Sexual Activity   Alcohol use: No   Drug use: No   Sexual activity: Not Currently    Birth control/protection: Surgical    Comment: 1st intercourse 70 yo-Fewer than 5 partners  Other Topics Concern   Not on file  Social History Narrative   Married, lives with spouse [name]   2 children - her son lives in [place] and daughter lives in [place]   OCCUPATION: owns a [employer], 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  All other systems reviewed and are negative.  PHYSICAL EXAMINATION:    BP 122/84   Pulse 84   Wt 188 lb (85.3 kg)   LMP 02/28/1995   SpO2 100%   BMI 32.78 kg/m     General appearance: alert, cooperative and appears stated age    Pelvic: External genitalia:  generalized rosy erythema of the vulva.               Urethra:  normal appearing urethra with no masses, tenderness or lesions              Bartholins and Skenes: normal                 Vagina: normal appearing vagina with normal color and discharge, no lesions              Cervix: absent Bimanual Exam:  Uterus:  absent              Adnexa: no mass, fullness, tenderness               Chaperone was present for exam:  Marchelle Folks, CMA.   ASSESSMENT  Status post TVH/BSO/sling/posterior repair.  DM. Vulvovaginitis.   PLAN  Vaginitis discussed. Wet prep:  negative yeast, clue cells, and  trichomonas.  Diflucan:  150 mg po x 1.  May repeat in 72 hours prn. #2, RF none.  Mycolog II to vulva bid x 2 weeks.  Fu prn   An After Visit Summary was printed and given to the patient.  24 min  total time was spent for this patient encounter, including preparation, face-to-face counseling with the patient, coordination of care, and documentation of the encounter.

## 2020-12-29 ENCOUNTER — Other Ambulatory Visit: Payer: Self-pay

## 2020-12-29 ENCOUNTER — Ambulatory Visit
Admission: RE | Admit: 2020-12-29 | Discharge: 2020-12-29 | Disposition: A | Discharge status: Home / Self Care | Payer: Medicare Other | Source: Ambulatory Visit

## 2020-12-29 ENCOUNTER — Other Ambulatory Visit: Payer: Self-pay | Admitting: Obstetrics and Gynecology

## 2020-12-29 DIAGNOSIS — Z1231 Encounter for screening mammogram for malignant neoplasm of breast: Secondary | ICD-10-CM | POA: Diagnosis not present

## 2020-12-30 ENCOUNTER — Other Ambulatory Visit: Payer: Self-pay | Admitting: Obstetrics and Gynecology

## 2020-12-30 DIAGNOSIS — R928 Other abnormal and inconclusive findings on diagnostic imaging of breast: Secondary | ICD-10-CM

## 2021-01-11 DIAGNOSIS — K743 Primary biliary cirrhosis: Secondary | ICD-10-CM | POA: Diagnosis not present

## 2021-01-25 ENCOUNTER — Other Ambulatory Visit: Payer: Self-pay

## 2021-01-25 ENCOUNTER — Ambulatory Visit: Admission: RE | Admit: 2021-01-25 | Payer: Medicare Other | Source: Ambulatory Visit

## 2021-01-25 ENCOUNTER — Ambulatory Visit
Admission: RE | Admit: 2021-01-25 | Discharge: 2021-01-25 | Disposition: A | Discharge status: Home / Self Care | Payer: Medicare Other | Source: Ambulatory Visit | Attending: Obstetrics and Gynecology | Admitting: Obstetrics and Gynecology

## 2021-01-25 DIAGNOSIS — N6489 Other specified disorders of breast: Secondary | ICD-10-CM | POA: Diagnosis not present

## 2021-01-25 DIAGNOSIS — R928 Other abnormal and inconclusive findings on diagnostic imaging of breast: Secondary | ICD-10-CM

## 2021-01-25 DIAGNOSIS — R921 Mammographic calcification found on diagnostic imaging of breast: Secondary | ICD-10-CM | POA: Diagnosis not present

## 2021-01-25 DIAGNOSIS — R922 Inconclusive mammogram: Secondary | ICD-10-CM | POA: Diagnosis not present

## 2021-02-03 DIAGNOSIS — K224 Dyskinesia of esophagus: Secondary | ICD-10-CM | POA: Diagnosis not present

## 2021-02-03 DIAGNOSIS — K743 Primary biliary cirrhosis: Secondary | ICD-10-CM | POA: Diagnosis not present

## 2021-02-06 ENCOUNTER — Encounter: Payer: Self-pay | Admitting: Obstetrics and Gynecology

## 2021-02-10 ENCOUNTER — Ambulatory Visit: Payer: Self-pay

## 2021-02-10 ENCOUNTER — Encounter: Payer: Self-pay | Admitting: Orthopaedic Surgery

## 2021-02-10 ENCOUNTER — Ambulatory Visit: Payer: Medicare Other | Admitting: Orthopaedic Surgery

## 2021-02-10 ENCOUNTER — Other Ambulatory Visit: Payer: Self-pay

## 2021-02-10 DIAGNOSIS — M545 Low back pain, unspecified: Secondary | ICD-10-CM | POA: Diagnosis not present

## 2021-02-10 DIAGNOSIS — M25552 Pain in left hip: Secondary | ICD-10-CM | POA: Diagnosis not present

## 2021-02-10 NOTE — Progress Notes (Signed)
Office Visit Note   Patient: Alexa Buchanan           Date of Birth: Oct 17, 1950           MRN: [id number] Visit Date: 02/10/2021              Requested by: Alysia Penna, MD [address] [place],  [place] [address] PCP: Alysia Penna, MD  Chief Complaint  Patient presents with   Lower Back - Pain      HPI: Patient is a pleasant 70 year old woman who is 1 week status post getting out of bed and experiencing pain in her left posterior and lateral buttock and leg.  She denies any injury.  She has recently changed her sleeping position and states she used to sleep on her left side but has been sleeping on her right side.  She denies any paresthesias any weakness.  Pain did get significant enough that she had difficulty walking on her leg.  She admits it is slowly improving.  She takes Tylenol and ibuprofen but only very rarely because of a liver condition.  She denies any fever or chills.  She denies any groin pain.  Assessment & Plan: Visit Diagnoses:  1. Low back pain, unspecified back pain laterality, unspecified chronicity, unspecified whether sciatica present   2. Pain in left hip     Findings consistent with trochanteric bursitis.  She was focally tender right over the bursa.  She has good hip range of motion and no pain with this so unlikely hip arthritis and x-rays were reassuring.  She does have some arthritic changes in her lower back however with her point tenderness over her trochanteric bursa and lack of radicular symptoms this would be less likely to be the cause of her current pain.  We did offer an injection today.  She would like to see if it gets better.  We talked to her about using topical Voltaren gel.  She will follow-up if she like for [place] to inject her trochanteric bursa  Ortho Exam  Patient is alert, oriented, no adenopathy, well-dressed, normal affect, normal respiratory effort. Examination of her today she is pleasant alert awake appropriate to exam.  Left  hip she has easily internal/external rotation which is not limited and is painless.  No tenderness with palpation of her spine.  Mild hesitation in her posterior buttock but most of her pain is directly to palpation over the trochanteric bursa.  Her strength distally is 5 out of 5 and equivalent bilaterally  Imaging: XR Pelvis 1-2 Views  Result Date: 02/10/2021 AP pelvis film was reviewed today.  She has congruent joint spacing between the femoral head and the acetabulum.  Some mild joint space narrowing but overall well seated in the acetabulum.  No acute osseous changes she does have some irregularity over the greater trochanter could be consistent with some inflammation but no fracture  No images are attached to the encounter.  Labs: Lab Results  Component Value Date   HGBA1C 7.4 (H) 07/19/2015   HGBA1C 6.5 11/07/2013   HGBA1C 6.5 12/03/2012   LABORGA  11/15/2015    Multiple organisms present,each less than 10,000 CFU/mL. These organisms,commonly found on external and internal genitalia,are considered colonizers. No further testing performed.      Lab Results  Component Value Date   ALBUMIN 4.0 08/01/2018   ALBUMIN 4.3 03/13/2017   ALBUMIN 3.9 10/17/2016    No results found for: MG Lab Results  Component Value Date  VD25OH 46 12/29/2010   VD25OH 27 (L) 06/01/2010    No results found for: PREALBUMIN CBC EXTENDED Latest Ref Rng & Units 08/01/2018 05/12/2016 07/19/2015  WBC 4.0 - 10.5 K/uL 9.6 8.5 10.7(H)  RBC 3.87 - 5.11 Mil/uL 4.39 4.12 4.63  HGB 12.0 - 15.0 g/dL 14.2 12.7 14.0  HCT 36.0 - 46.0 % 41.2 38.4 42.2  PLT 150.0 - 400.0 K/uL 330.0 328 314  NEUTROABS 1.4 - 7.7 K/uL 5.0 - -  LYMPHSABS 0.7 - 4.0 K/uL 3.8 - -     There is no height or weight on file to calculate BMI.  Orders:  Orders Placed This Encounter  Procedures   XR Lumbar Spine 2-3 Views   XR Pelvis 1-2 Views   No orders of the defined types were placed in this encounter.    Procedures: No  procedures performed  Clinical Data: No additional findings.  ROS:  All other systems negative, except as noted in the HPI. Review of Systems  Objective: Vital Signs: LMP 02/28/1995   Specialty Comments:  No specialty comments available.  PMFS History: Patient Active Problem List   Diagnosis Date Noted   Acute bronchitis 02/10/2014   Smoker 11/12/2013   Type II or unspecified type diabetes mellitus without mention of complication, uncontrolled 12/03/2012   Elevated blood pressure reading without diagnosis of hypertension 09/04/2012   OSA (obstructive sleep apnea) 07/27/2011   Insomnia 07/27/2011   Wheezing 07/27/2011   Unspecified adverse effect of unspecified drug, medicinal and biological substance 07/04/2011   Vitamin D deficiency 12/29/2010   CERVICAL RADICULOPATHY, RIGHT 04/21/2010   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   DIVERTICULOSIS, COLON [id number]   DYSMETABOLIC SYNDROME X [id number]   IBS 09/10/2007   Uncontrolled secondary diabetes with peripheral neuropathy 02/04/2007   DEPRESSION 02/04/2007   MIGRAINE HEADACHE 02/04/2007   STRESS INCONTINENCE 02/04/2007   HISTOPLASMOSIS, HX OF 02/04/2007   LIVER FUNCTION TESTS, ABNORMAL, HX OF 02/04/2007   COLITIS, HX OF 02/04/2007   Past Medical History:  Diagnosis Date   Arthritis    Complication of anesthesia    woke up to soon.  reported "bad gag reflex"      Diabetes mellitus    Type 2   Diverticulosis    Dysmetabolic syndrome X    Histoplasmosis 1971   IBS (irritable bowel syndrome)    Liver disease    PBC   Migraines    ice pick headaches, Dr. Beacher May   OSA (obstructive sleep apnea) 07/27/2011   Home sleep study 08/18/11 >> AHI 8.1, SpO2 low 74%.  pt stated she does not have sleep apnes   Seasonal allergies    UTI symptoms     Family History  Problem Relation Age of Onset   Leukemia Father        CLL   Hyperlipidemia Mother    Multiple sclerosis Mother    Hypertension Mother    Diabetes Maternal  Grandmother        also had malignant lower neck mass of unknown etiology   Breast cancer Sister        age 25   Hypertension Sister        related to migraines   Migraines Sister    Stroke Maternal Grandfather 51   Heart disease Maternal Aunt        porcine valve   Heart disease Maternal Uncle        valvular disease   Colon cancer Neg Hx    Stomach cancer Neg Hx  Pancreatic cancer Neg Hx    Esophageal cancer Neg Hx     Past Surgical History:  Procedure Laterality Date   BREAST EXCISIONAL BIOPSY Right    BREAST LUMPECTOMY WITH RADIOACTIVE SEED LOCALIZATION Right 07/22/2015   Procedure: BREAST LUMPECTOMY WITH RADIOACTIVE SEED LOCALIZATION;  Surgeon: Coralie Keens, MD;  Location: [facility];  Service: General;  Laterality: Right;   COLONOSCOPY     Tics; Dr Olevia Perches   DILATION AND CURETTAGE OF UTERUS     Gastric Plication  [id number]   KNEE ARTHROSCOPY Left 1990   LASER ABLATION OF VASCULAR LESION  2009   Dr. Thornell Mule, wart removed from [place]  2003   with vaginal wall repair and sling   ROTATOR CUFF REPAIR Right 2016   TONSILLECTOMY     1982/1992   VAGINAL HYSTERECTOMY  2003   Vag Hyst,BSO,Sling, posterior repair   VULVA SURGERY     keratoses   WRIST SURGERY Left 1988    tendon repair   Social History   Occupational History   Occupation: Building surveyor  Tobacco Use   Smoking status: Every Day    Packs/day: 0.50    Years: 30.00    Pack years: 15.00    Types: Cigarettes   Smokeless tobacco: Never   Tobacco comments:    Smoked 1968-present; up to 1 ppd  Vaping Use   Vaping Use: Former  Substance and Sexual Activity   Alcohol use: No   Drug use: No   Sexual activity: Not Currently    Birth control/protection: Surgical    Comment: 1st intercourse 70 yo-Fewer than 5 partners

## 2021-03-02 ENCOUNTER — Encounter: Payer: Self-pay | Admitting: Obstetrics and Gynecology

## 2021-03-04 ENCOUNTER — Other Ambulatory Visit: Payer: Self-pay

## 2021-03-04 ENCOUNTER — Ambulatory Visit: Payer: Medicare Other | Admitting: Obstetrics and Gynecology

## 2021-03-04 ENCOUNTER — Encounter: Payer: Self-pay | Admitting: Obstetrics and Gynecology

## 2021-03-04 VITALS — BP 130/68 | HR 66 | Wt 188.0 lb

## 2021-03-04 DIAGNOSIS — R3915 Urgency of urination: Secondary | ICD-10-CM | POA: Diagnosis not present

## 2021-03-04 DIAGNOSIS — N762 Acute vulvitis: Secondary | ICD-10-CM

## 2021-03-04 LAB — WET PREP FOR TRICH, YEAST, CLUE

## 2021-03-04 MED ORDER — BETAMETHASONE VALERATE 0.1 % EX OINT: 1.0000 "application " | TOPICAL_OINTMENT | Freq: Two times a day (BID) | CUTANEOUS | 0 refills | Status: DC

## 2021-03-04 NOTE — Progress Notes (Signed)
GYNECOLOGY  VISIT   HPI: 71 y.o.   Married White or Caucasian Not Hispanic or Latino  female   [phone] with Patient's last menstrual period was 02/28/1995.   here for internal vaginal irritation. She c/o a 5 day h/o vaginal sensitivity, slight stinging, her clitoris is sensitive. No itching, no discharge.  She takes ditropan, see's Urology. She is having an increase in urinary urgency in the last few months. Not leaking.   GYNECOLOGIC HISTORY: Patient's last menstrual period was 02/28/1995. Contraception: Hyst Menopausal hormone therapy: None        OB History     Gravida  3   Para  2   Term  2   Preterm      AB  1   Living  2      SAB      IAB      Ectopic      Multiple      Live Births                 Patient Active Problem List   Diagnosis Date Noted   Acute bronchitis 02/10/2014   Smoker 11/12/2013   Type II or unspecified type diabetes mellitus without mention of complication, uncontrolled 12/03/2012   Elevated blood pressure reading without diagnosis of hypertension 09/04/2012   OSA (obstructive sleep apnea) 07/27/2011   Insomnia 07/27/2011   Wheezing 07/27/2011   Unspecified adverse effect of unspecified drug, medicinal and biological substance 07/04/2011   Vitamin D deficiency 12/29/2010   CERVICAL RADICULOPATHY, RIGHT 04/21/2010   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   DIVERTICULOSIS, COLON [id number]   DYSMETABOLIC SYNDROME X [id number]   IBS 09/10/2007   Uncontrolled secondary diabetes with peripheral neuropathy 02/04/2007   DEPRESSION 02/04/2007   MIGRAINE HEADACHE 02/04/2007   STRESS INCONTINENCE 02/04/2007   HISTOPLASMOSIS, HX OF 02/04/2007   LIVER FUNCTION TESTS, ABNORMAL, HX OF 02/04/2007   COLITIS, HX OF 02/04/2007    Past Medical History:  Diagnosis Date   Arthritis    Complication of anesthesia    woke up to soon.  reported "bad gag reflex"      Diabetes mellitus    Type 2   Diverticulosis    Dysmetabolic syndrome X     Histoplasmosis 1971   IBS (irritable bowel syndrome)    Liver disease    PBC   Migraines    ice pick headaches, Dr. Beacher May   OSA (obstructive sleep apnea) 07/27/2011   Home sleep study 08/18/11 >> AHI 8.1, SpO2 low 74%.  pt stated she does not have sleep apnes   Seasonal allergies    UTI symptoms     Past Surgical History:  Procedure Laterality Date   BREAST EXCISIONAL BIOPSY Right    BREAST LUMPECTOMY WITH RADIOACTIVE SEED LOCALIZATION Right 07/22/2015   Procedure: BREAST LUMPECTOMY WITH RADIOACTIVE SEED LOCALIZATION;  Surgeon: Coralie Keens, MD;  Location: [facility];  Service: General;  Laterality: Right;   COLONOSCOPY     Tics; Dr Olevia Perches   DILATION AND CURETTAGE OF UTERUS     Gastric Plication  99991111   KNEE ARTHROSCOPY Left 1990   LASER ABLATION OF VASCULAR LESION  2009   Dr. Thornell Mule, wart removed from [place]  2003   with vaginal wall repair and sling   ROTATOR CUFF REPAIR Right 2016   TONSILLECTOMY     1982/1992   VAGINAL HYSTERECTOMY  2003   Vag Hyst,BSO,Sling, posterior repair   VULVA SURGERY     keratoses  WRIST SURGERY Left 1988    tendon repair    Current Outpatient Medications  Medication Sig Dispense Refill   aspirin EC 81 MG tablet Take 81 mg by mouth daily.     Calcium Carbonate-Vitamin D (CALCIUM + D PO) Take 1 tablet by mouth 2 (two) times daily.     cetirizine (ZYRTEC) 10 MG tablet Take 10 mg by mouth daily.     Cholecalciferol (VITAMIN D3) 2000 UNITS TABS Take 2,000 Units by mouth 2 (two) times daily.     gabapentin (NEURONTIN) 100 MG capsule Take 100 mg by mouth 2 (two) times daily.      glucose blood (ONE TOUCH ULTRA TEST) test strip CHECK BLOOD SUGAR TWICE A DAY AS DIRECTED. 50 each 6   HYDROcodone-acetaminophen (NORCO/VICODIN) 5-325 MG tablet Take 1 tablet by mouth every 6 (six) hours as needed.     JARDIANCE 10 MG TABS tablet Take 10 mg by mouth daily.     metFORMIN (GLUCOPHAGE-XR) 500 MG 24 hr tablet TAKE 1 TABLET ONCE DAILY. 90  tablet 1   Multiple Vitamin (MULTIVITAMIN) capsule Take 1 capsule by mouth daily. Centrum Silver.     NOVOFINE PEN NEEDLE 32G X 6 MM MISC Inject into the skin daily.     oxybutynin (DITROPAN XL) 15 MG 24 hr tablet TAKE 1 TABLET DAILY.     ursodiol (ACTIGALL) 300 MG capsule TAKE (2) CAPSULES BY MOUTH TWICE DAILY. 240 capsule 0   zolpidem (AMBIEN) 10 MG tablet Take 5 mg by mouth as needed for sleep.      Current Facility-Administered Medications  Medication Dose Route Frequency Provider Last Rate Last Admin   pneumococcal 23 valent vaccine (PNU-IMMUNE) injection 0.5 mL  0.5 mL Intramuscular Tomorrow-1000 Hendricks Limes, MD         ALLERGIES: Patient has no known allergies.  Family History  Problem Relation Age of Onset   Leukemia Father        CLL   Hyperlipidemia Mother    Multiple sclerosis Mother    Hypertension Mother    Diabetes Maternal Grandmother        also had malignant lower neck mass of unknown etiology   Breast cancer Sister        age 3   Hypertension Sister        related to migraines   Migraines Sister    Stroke Maternal Grandfather 49   Heart disease Maternal Aunt        porcine valve   Heart disease Maternal Uncle        valvular disease   Colon cancer Neg Hx    Stomach cancer Neg Hx    Pancreatic cancer Neg Hx    Esophageal cancer Neg Hx     Social History   Socioeconomic History   Marital status: Married    Spouse name: Not on file   Number of children: 2   Years of education: Not on file   Highest education level: Not on file  Occupational History   Occupation: Building surveyor  Tobacco Use   Smoking status: Every Day    Packs/day: 0.50    Years: 30.00    Pack years: 15.00    Types: Cigarettes   Smokeless tobacco: Never   Tobacco comments:    Smoked 1968-present; up to 1 ppd  Vaping Use   Vaping Use: Former  Substance and Sexual Activity   Alcohol use: No   Drug use: No   Sexual activity: Not Currently  Birth control/protection:  Surgical    Comment: 1st intercourse 71 yo-Fewer than 5 partners  Other Topics Concern   Not on file  Social History Narrative   Married, lives with spouse [name]   2 children - her son lives in [place] and daughter lives in [place]: owns a Environmental consultant, 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   HENT: Negative.    Eyes: Negative.   Respiratory: Negative.    Cardiovascular: Negative.   Gastrointestinal: Negative.   Genitourinary: Negative.   Musculoskeletal: Negative.   Skin: Negative.   Neurological: Negative.   Endo/Heme/Allergies: Negative.   Psychiatric/Behavioral: Negative.     PHYSICAL EXAMINATION:    BP 130/68    Pulse 66    Wt 188 lb (85.3 kg)    LMP 02/28/1995    SpO2 99%    BMI 32.78 kg/m     General appearance: alert, cooperative and appears stated age   Pelvic: External genitalia:  no lesions, atrophic, mild erythema              Urethra:  normal appearing urethra with no masses, tenderness or lesions              Bartholins and Skenes: normal                 Vagina: atrophic appearing vagina with normal color, no discharge, no lesions              Cervix: absent               Chaperone was present for exam.  1. Acute vulvitis - WET PREP FOR TRICH, YEAST, CLUE: negative - SureSwab Advanced Vaginitis, TMA - betamethasone valerate ointment (VALISONE) 0.1 %; Apply 1 application topically 2 (two) times daily. Can use for up to 2 weeks as needed  Dispense: 30 g; Refill: 0 -Vulvar skin care information given  2. Urinary urgency Worsening - Urinalysis,Complete w/RFL Culture  CC: Dr Quincy Simmonds

## 2021-03-06 LAB — URINALYSIS, COMPLETE W/RFL CULTURE
Bilirubin Urine: NEGATIVE
Casts: NONE SEEN /LPF
Crystals: NONE SEEN /HPF
Hgb urine dipstick: NEGATIVE
Hyaline Cast: NONE SEEN /LPF
Ketones, ur: NEGATIVE
Leukocyte Esterase: NEGATIVE
Nitrites, Initial: NEGATIVE
Protein, ur: NEGATIVE
RBC / HPF: NONE SEEN /HPF (ref 0–2)
Specific Gravity, Urine: 1.015 (ref 1.001–1.035)
Yeast: NONE SEEN /HPF
pH: 5.5 (ref 5.0–8.0)

## 2021-03-06 LAB — URINE CULTURE
MICRO NUMBER:: 12837395
SPECIMEN QUALITY:: ADEQUATE

## 2021-03-06 LAB — CULTURE INDICATED

## 2021-03-07 ENCOUNTER — Other Ambulatory Visit: Payer: Self-pay | Admitting: *Deleted

## 2021-03-07 LAB — SURESWAB® ADVANCED VAGINITIS,TMA
CANDIDA SPECIES: DETECTED — AB
Candida glabrata: DETECTED — AB
SURESWAB(R) ADV BACTERIAL VAGINOSIS(BV),TMA: NEGATIVE
TRICHOMONAS VAGINALIS (TV),TMA: NOT DETECTED

## 2021-03-07 MED ORDER — FLUCONAZOLE 150 MG PO TABS: ORAL_TABLET | ORAL | 0 refills | Status: DC

## 2021-03-25 DIAGNOSIS — M25559 Pain in unspecified hip: Secondary | ICD-10-CM | POA: Diagnosis not present

## 2021-03-25 DIAGNOSIS — E1169 Type 2 diabetes mellitus with other specified complication: Secondary | ICD-10-CM | POA: Diagnosis not present

## 2021-03-25 DIAGNOSIS — F17200 Nicotine dependence, unspecified, uncomplicated: Secondary | ICD-10-CM | POA: Diagnosis not present

## 2021-03-25 DIAGNOSIS — R42 Dizziness and giddiness: Secondary | ICD-10-CM | POA: Diagnosis not present

## 2021-04-06 DIAGNOSIS — K743 Primary biliary cirrhosis: Secondary | ICD-10-CM | POA: Diagnosis not present

## 2021-04-07 DIAGNOSIS — H90A32 Mixed conductive and sensorineural hearing loss, unilateral, left ear with restricted hearing on the contralateral side: Secondary | ICD-10-CM | POA: Insufficient documentation

## 2021-04-08 DIAGNOSIS — H90A21 Sensorineural hearing loss, unilateral, right ear, with restricted hearing on the contralateral side: Secondary | ICD-10-CM | POA: Diagnosis not present

## 2021-04-08 DIAGNOSIS — R42 Dizziness and giddiness: Secondary | ICD-10-CM | POA: Diagnosis not present

## 2021-04-08 DIAGNOSIS — H90A32 Mixed conductive and sensorineural hearing loss, unilateral, left ear with restricted hearing on the contralateral side: Secondary | ICD-10-CM | POA: Diagnosis not present

## 2021-04-08 DIAGNOSIS — H903 Sensorineural hearing loss, bilateral: Secondary | ICD-10-CM | POA: Diagnosis not present

## 2021-04-08 DIAGNOSIS — H93A2 Pulsatile tinnitus, left ear: Secondary | ICD-10-CM | POA: Diagnosis not present

## 2021-05-17 DIAGNOSIS — M79642 Pain in left hand: Secondary | ICD-10-CM | POA: Diagnosis not present

## 2021-05-17 DIAGNOSIS — M6740 Ganglion, unspecified site: Secondary | ICD-10-CM | POA: Diagnosis not present

## 2021-05-18 DIAGNOSIS — H43811 Vitreous degeneration, right eye: Secondary | ICD-10-CM | POA: Diagnosis not present

## 2021-05-18 DIAGNOSIS — Z961 Presence of intraocular lens: Secondary | ICD-10-CM | POA: Diagnosis not present

## 2021-05-18 DIAGNOSIS — H04123 Dry eye syndrome of bilateral lacrimal glands: Secondary | ICD-10-CM | POA: Diagnosis not present

## 2021-05-31 ENCOUNTER — Telehealth: Payer: Self-pay | Admitting: Obstetrics and Gynecology

## 2021-05-31 NOTE — Telephone Encounter (Signed)
Ok to remove from mammogram hold and return to routine screening.  

## 2021-06-01 NOTE — Telephone Encounter (Signed)
Removed from hold. ?

## 2021-06-20 ENCOUNTER — Telehealth: Payer: Self-pay | Admitting: *Deleted

## 2021-06-20 NOTE — Telephone Encounter (Signed)
Patient called to follow up from [facility] on 02/2021 saw Dr.Jertson. Patient reports she has been dealing with recurrent yeast infection. States she does not need an office visit, she takes Jardiance tablet which she was told by her PCP can causes recurrent yeast infection. Patient said she only has vaginal irritation and the betamethasone ointment does help with external irritation.  ? ?She asked if this could be used long term? If so 3 days per week okay?  ?If she can't use the ointment long term if diflucan tablets can because once a week for 3-4 weeks? ? ?Patient said it will be hard to change the Jardiance Rx with PCP because the type of medication. She would prefer not to change it works well for her. She is not having any other vaginal symptoms besides external irritation.  Patient would like any advice you can provide. Please advise  ?

## 2021-06-20 NOTE — Telephone Encounter (Signed)
Any medication should be used for the shortest period of time to adequately treat the condition. ? ?Diflucan can treat some yeast infections, but not all yeast infections.  ? ?It can be prescribed for a patient who has documented recurrent yeast infections and no contraindications.  ? ?Diflucan can cause an irregular heart beat and affect liver function.  ? ?Betamethasone can treat external vulvar irritation, but it does not treat yeast infection.  ? ?Over the counter Monistat cream for the vaginal is a safe option to treat internal yeast if needed. ? ?I hope this is helpful. ? ?If Ms. Alexa Buchanan has further concerns, I would recommend an office visit. ? ? ? ?  ?

## 2021-06-20 NOTE — Telephone Encounter (Signed)
Patient informed with below note. 

## 2021-06-29 DIAGNOSIS — E1169 Type 2 diabetes mellitus with other specified complication: Secondary | ICD-10-CM | POA: Diagnosis not present

## 2021-06-29 DIAGNOSIS — E669 Obesity, unspecified: Secondary | ICD-10-CM | POA: Diagnosis not present

## 2021-07-06 ENCOUNTER — Ambulatory Visit (INDEPENDENT_AMBULATORY_CARE_PROVIDER_SITE_OTHER): Payer: Medicare Other | Admitting: Orthopaedic Surgery

## 2021-07-06 ENCOUNTER — Encounter: Payer: Self-pay | Admitting: Orthopaedic Surgery

## 2021-07-06 ENCOUNTER — Ambulatory Visit (INDEPENDENT_AMBULATORY_CARE_PROVIDER_SITE_OTHER): Payer: Medicare Other

## 2021-07-06 VITALS — Ht 65.0 in | Wt 184.0 lb

## 2021-07-06 DIAGNOSIS — M542 Cervicalgia: Secondary | ICD-10-CM | POA: Diagnosis not present

## 2021-07-06 DIAGNOSIS — M47812 Spondylosis without myelopathy or radiculopathy, cervical region: Secondary | ICD-10-CM | POA: Diagnosis not present

## 2021-07-06 NOTE — Progress Notes (Addendum)
? ?Office Visit Note ?  ?Patient: Alexa Buchanan           ?Date of Birth: 06/25/1950           ?MRN: [id number] ?Visit Date: 07/06/2021 ?             ?Requested by: Alexa Penna, MD ?[address] ?[place],  [place] [address] ?PCP: Alexa Penna, MD ? ? ?Assessment & Plan: ?Visit Diagnoses:  ?1. Neck pain   ?2. Spondylosis of cervical region without myelopathy or radiculopathy   ? ? ?Plan: Alexa Buchanan has had several episodes of neck pain especially when she sits at her computer without associated referred pain to either shoulder or either upper extremity.  Over time she has lost some motion.  X-rays demonstrate significant degenerative changes in the mid cervical spine with some mild straightening of the normal lordosis.  She does have some decreased motion in flexion extension or rotation consistent with the arthritis but neurologically intact.  I think a course of physical therapy would be very helpful.  Might consider CT scan of cervical spine ? ?Follow-Up Instructions: Return if symptoms worsen or fail to improve.  ? ?Orders:  ?Orders Placed This Encounter  ?Procedures  ? XR Cervical Spine 2 or 3 views  ? Ambulatory referral to Orthopedic Surgery  ? ?No orders of the defined types were placed in this encounter. ? ? ? ? Procedures: ?No procedures performed ? ? ?Clinical Data: ?No additional findings. ? ? ?Subjective: ?Chief Complaint  ?Patient presents with  ? Neck - Pain  ?Patient presents today for her neck. She said that she was sitting at her computer about 5 weeks ago and turned her head. Upon turning her head she "felt a crack". She said that she has noticed that her neck pops a lot. She has no pain at all, but just wants to have it checked.  Denies any upper extremity discomfort numbness or tingling. ?HPI ? ?Review of Systems ? ? ?Objective: ?Vital Signs: Ht 5\' 5"  (1.651 m)   Wt 184 lb (83.5 kg)   LMP 02/28/1995   BMI 30.62 kg/m?  ? ?Physical Exam ?Constitutional:   ?   Appearance: She is  well-developed.  ?Eyes:  ?   Pupils: Pupils are equal, round, and reactive to light.  ?Pulmonary:  ?   Effort: Pulmonary effort is normal.  ?Skin: ?   General: Skin is warm and dry.  ?Neurological:  ?   Mental Status: She is alert and oriented to person, place, and time.  ?Psychiatric:     ?   Behavior: Behavior normal.  ? ? ?Ortho Exam cervical spine motion is somewhat limited.  Able to touch chin to chest but only about 40 to 50% of normal neck extension.  No referred pain to either upper extremity or shoulder at that point.  Some mild neck discomfort on extension.  No percussible pain.  No masses.  Only about 50% of normal rotation to the right to the left but again without pain.  Neurologically intact to both upper extremities.  No shoulder pain ?Specialty Comments:  ?No specialty comments available. ? ?Imaging: ?XR Cervical Spine 2 or 3 views ? ?Result Date: 07/06/2021 ?Films of the cervical spine were obtained in 2 projections.  There are degenerative changes at C4-5, C5-6 and C 6 7 with narrowing of the disc spaces and anterior osteophyte formation.  There also was facet sclerosis.  Very minimal straightening of the normal cervical lordosis.  ? ? ?PMFS  History: ?Patient Active Problem List  ? Diagnosis Date Noted  ? Osteoarthritis cervical spine 07/06/2021  ? Acute bronchitis 02/10/2014  ? Smoker 11/12/2013  ? Type II or unspecified type diabetes mellitus without mention of complication, uncontrolled 12/03/2012  ? Elevated blood pressure reading without diagnosis of hypertension 09/04/2012  ? OSA (obstructive sleep apnea) 07/27/2011  ? Insomnia 07/27/2011  ? Wheezing 07/27/2011  ? Unspecified adverse effect of unspecified drug, medicinal and biological substance 07/04/2011  ? Vitamin D deficiency 12/29/2010  ? CERVICAL RADICULOPATHY, RIGHT 04/21/2010  ? 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  ? DIVERTICULOSIS, COLON 09/14/2008  ? DYSMETABOLIC SYNDROME X 09/10/2007  ? IBS 09/10/2007  ? Uncontrolled secondary diabetes  with peripheral neuropathy 02/04/2007  ? DEPRESSION 02/04/2007  ? MIGRAINE HEADACHE 02/04/2007  ? STRESS INCONTINENCE 02/04/2007  ? HISTOPLASMOSIS, HX OF 02/04/2007  ? LIVER FUNCTION TESTS, ABNORMAL, HX OF 02/04/2007  ? COLITIS, HX OF 02/04/2007  ? ?Past Medical History:  ?Diagnosis Date  ? Arthritis   ? Complication of anesthesia   ? woke up to soon.  reported "bad gag reflex"     ? Diabetes mellitus   ? Type 2  ? Diverticulosis   ? Dysmetabolic syndrome X   ? Histoplasmosis 1971  ? IBS (irritable bowel syndrome)   ? Liver disease   ? PBC  ? Migraines   ? ice pick headaches, Dr. Marylou Flesher  ? OSA (obstructive sleep apnea) 07/27/2011  ? Home sleep study 08/18/11 >> AHI 8.1, SpO2 low 74%.  pt stated she does not have sleep apnes  ? Seasonal allergies   ? UTI symptoms   ?  ?Family History  ?Problem Relation Age of Onset  ? Leukemia Father   ?     CLL  ? Hyperlipidemia Mother   ? Multiple sclerosis Mother   ? Hypertension Mother   ? Diabetes Maternal Grandmother   ?     also had malignant lower neck mass of unknown etiology  ? Breast cancer Sister   ?     age 5  ? Hypertension Sister   ?     related to migraines  ? Migraines Sister   ? Stroke Maternal Grandfather 47  ? Heart disease Maternal Aunt   ?     porcine valve  ? Heart disease Maternal Uncle   ?     valvular disease  ? Colon cancer Neg Hx   ? Stomach cancer Neg Hx   ? Pancreatic cancer Neg Hx   ? Esophageal cancer Neg Hx   ?  ?Past Surgical History:  ?Procedure Laterality Date  ? BREAST EXCISIONAL BIOPSY Right   ? BREAST LUMPECTOMY WITH RADIOACTIVE SEED LOCALIZATION Right 07/22/2015  ? Procedure: BREAST LUMPECTOMY WITH RADIOACTIVE SEED LOCALIZATION;  Surgeon: Abigail Miyamoto, MD;  Location: [facility];  Service: General;  Laterality: Right;  ? COLONOSCOPY    ? Tics; Dr Juanda Chance  ? DILATION AND CURETTAGE OF UTERUS    ? Gastric Plication  1981  ? KNEE ARTHROSCOPY Left 1990  ? LASER ABLATION OF VASCULAR LESION  2009  ? Dr. Dorma Russell, wart removed from nose  ? RECTOCELE REPAIR   2003  ? with vaginal wall repair and sling  ? ROTATOR CUFF REPAIR Right 2016  ? TONSILLECTOMY    ? 1982/1992  ? VAGINAL HYSTERECTOMY  2003  ? Vag Hyst,BSO,Sling, posterior repair  ? VULVA SURGERY    ? keratoses  ? WRIST SURGERY Left 1988  ?  tendon repair  ? ?Social History  ? ?Occupational History  ?  Occupation: Orthoptistweb design  ?Tobacco Use  ? Smoking status: Every Day  ?  Packs/day: 0.50  ?  Years: 30.00  ?  Pack years: 15.00  ?  Types: Cigarettes  ? Smokeless tobacco: Never  ? Tobacco comments:  ?  Smoked 1968-present; up to 1 ppd  ?Vaping Use  ? Vaping Use: Former  ?Substance and Sexual Activity  ? Alcohol use: No  ? Drug use: No  ? Sexual activity: Not Currently  ?  Birth control/protection: Surgical  ?  Comment: 1st intercourse 71 yo-Fewer than 5 partners  ? ? ? ? ? ? ?

## 2021-07-06 NOTE — Addendum Note (Signed)
Addended by: Wendi Maya on: 07/06/2021 11:29 AM ? ? Modules accepted: Orders ? ?

## 2021-07-19 NOTE — Therapy (Signed)
OUTPATIENT PHYSICAL THERAPY CERVICAL EVALUATION   Patient Name: Alexa Buchanan MRN: [id number] DOB:Jan 26, 1951, 71 y.o., female Today's Date: 07/20/2021   PT End of Session - 07/20/21 0923     Visit Number 1    Number of Visits 4    Date for PT Re-Evaluation 08/17/21    Authorization Type BCBS Medicare    Progress Note Due on Visit 10    PT Start Time 0845    PT Stop Time 0923    PT Time Calculation (min) 38 min    Activity Tolerance Patient tolerated treatment well    Behavior During Therapy [facility] for tasks assessed/performed             Past Medical History:  Diagnosis Date   Arthritis    Complication of anesthesia    woke up to soon.  reported "bad gag reflex"      Diabetes mellitus    Type 2   Diverticulosis    Dysmetabolic syndrome X    Histoplasmosis 1971   IBS (irritable bowel syndrome)    Liver disease    PBC   Migraines    ice pick headaches, Dr. Marylou Flesher   OSA (obstructive sleep apnea) 07/27/2011   Home sleep study 08/18/11 >> AHI 8.1, SpO2 low 74%.  pt stated she does not have sleep apnes   Seasonal allergies    UTI symptoms    Past Surgical History:  Procedure Laterality Date   BREAST EXCISIONAL BIOPSY Right    BREAST LUMPECTOMY WITH RADIOACTIVE SEED LOCALIZATION Right 07/22/2015   Procedure: BREAST LUMPECTOMY WITH RADIOACTIVE SEED LOCALIZATION;  Surgeon: Abigail Miyamoto, MD;  Location: [facility] OR;  Service: General;  Laterality: Right;   COLONOSCOPY     Tics; Dr Juanda Chance   DILATION AND CURETTAGE OF UTERUS     Gastric Plication  1981   KNEE ARTHROSCOPY Left 1990   LASER ABLATION OF VASCULAR LESION  2009   Dr. Dorma Russell, wart removed from nose   RECTOCELE REPAIR  2003   with vaginal wall repair and sling   ROTATOR CUFF REPAIR Right 2016   TONSILLECTOMY     1982/1992   VAGINAL HYSTERECTOMY  2003   Vag Hyst,BSO,Sling, posterior repair   VULVA SURGERY     keratoses   WRIST SURGERY Left 1988    tendon repair   Patient Active Problem List   Diagnosis  Date Noted   Osteoarthritis cervical spine 07/06/2021   Acute bronchitis 02/10/2014   Smoker 11/12/2013   Type II or unspecified type diabetes mellitus without mention of complication, uncontrolled 12/03/2012   Elevated blood pressure reading without diagnosis of hypertension 09/04/2012   OSA (obstructive sleep apnea) 07/27/2011   Insomnia 07/27/2011   Wheezing 07/27/2011   Unspecified adverse effect of unspecified drug, medicinal and biological substance 07/04/2011   Vitamin D deficiency 12/29/2010   CERVICAL RADICULOPATHY, RIGHT 04/21/2010   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   DIVERTICULOSIS, COLON 09/14/2008   DYSMETABOLIC SYNDROME X 09/10/2007   IBS 09/10/2007   Uncontrolled secondary diabetes with peripheral neuropathy 02/04/2007   DEPRESSION 02/04/2007   MIGRAINE HEADACHE 02/04/2007   STRESS INCONTINENCE 02/04/2007   HISTOPLASMOSIS, HX OF 02/04/2007   LIVER FUNCTION TESTS, ABNORMAL, HX OF 02/04/2007   COLITIS, HX OF 02/04/2007    PCP: Alysia Penna, MD  REFERRING PROVIDER: Valeria Batman, MD   REFERRING DIAG: M54.2 (ICD-10-CM) - Neck pain M47.812 (ICD-10-CM) - Spondylosis of cervical region without myelopathy or radiculopathy   THERAPY DIAG:  Cervicalgia - Plan: PT plan of care  cert/re-cert  Abnormal posture - Plan: PT plan of care cert/re-cert  Rationale for Evaluation and Treatment Rehabilitation  ONSET DATE: 5-6 weeks ago  SUBJECTIVE:                                                                                                                                                                                                         SUBJECTIVE STATEMENT: Pt reports she was at her computer about 5 weeks ago and turned her head to look at a monitor and felt a pretty strong "pop" in the neck which then continued to occur.  She denies pain except with tilting head backward.    PERTINENT HISTORY:  OSA, DM with neuropathy, depression, HTN  PAIN:  Are you having  pain? Yes: NPRS scale: 0, up to 2/10 Pain location: neck Pain description: popping Aggravating factors: turning head to Rt, looking up Relieving factors: avoiding agg factors  PRECAUTIONS: None  WEIGHT BEARING RESTRICTIONS No  FALLS:  Has patient fallen in last 6 months? No  LIVING ENVIRONMENT: Lives with: lives with their spouse Lives in: House/apartment Stairs: Yes: Internal: 14 steps; on right going up and External: 3 steps; can reach both Has following equipment at home: Grab bars  OCCUPATION: part time computer work: Chartered loss adjusterauthor, Programmer, multimediaeditor, Emergency planning/management officerweb designer, Catering managerconsulting (gets podcasts started)  PLOF: Independent and Leisure: reading, travel to grandkids (5 hours away)  PATIENT GOALS needs HEP  OBJECTIVE:   DIAGNOSTIC FINDINGS:  Xrays: OA cervical spine  PATIENT SURVEYS:  07/20/21 FOTO deferred today   COGNITION: Overall cognitive status: Within functional limits for tasks assessed   SENSATION: WFL  POSTURE: rounded shoulders, forward head, and increased thoracic kyphosis  PALPATION: 07/20/21: tenderness along Rt upper trap, levator scapulae and and cervical paraspinals   CERVICAL ROM:   Active ROM A/PROM (deg) eval  Flexion 48  Extension 20  (uncomfortable)  Right lateral flexion 10  Left lateral flexion 14  Right rotation 64  Left rotation 69   (Blank rows = not tested)  UPPER EXTREMITY ROM:  07/20/21: WNL   UPPER EXTREMITY MMT:  MMT Right eval Left eval  Shoulder flexion 4/5 4+/5  Shoulder abduction 4/5 4+/5   (Blank rows = not tested)  CERVICAL SPECIAL TESTS:  07/20/21: Spurling's test: Negative and Distraction test: Negative   TODAY'S TREATMENT:  07/20/21: See HEP - performed trial reps PRN for comprehension, discussed workstation set up and modifications to limit repetitive cervical rotation, also demonstrated use of theracane for TPR and STM   PATIENT EDUCATION:  Education details: HEP Person educated: Patient Education  method: Explanation,  Demonstration, and Handouts Education comprehension: verbalized understanding, returned demonstration, and needs further education   HOME EXERCISE PROGRAM: Access Code: [email or web address] URL: [email or web address] Date: 07/20/2021 Prepared by: Moshe Cipro  Exercises - Standing Backward Shoulder Rolls  - 2 x daily - 7 x weekly - 1 sets - 10 reps - Seated Scapular Retraction  - 2 x daily - 7 x weekly - 1 sets - 10 reps - 5 sec hold - Shoulder External Rotation and Scapular Retraction  - 2 x daily - 7 x weekly - 1 sets - 10 reps - 1-2 sec hold - Seated Cervical Retraction  - 2 x daily - 7 x weekly - 1 sets - 10 reps - 5 sec hold - Seated Assisted Cervical Rotation with Towel  - 2 x daily - 7 x weekly - 1 sets - 5-10 reps - 10 sec hold  ASSESSMENT:  CLINICAL IMPRESSION: Patient is a 71 y.o. female who was seen today for physical therapy evaluation and treatment for neck pain.  She demonstrates ROM and strength limitations as well as postural abnormalities affecting function.  At this time her pain is minimal and pt would like to work on HEP at home and will call back if needed.      OBJECTIVE IMPAIRMENTS decreased ROM, decreased strength, increased fascial restrictions, increased muscle spasms, postural dysfunction, and pain.   ACTIVITY LIMITATIONS sitting and standing  PARTICIPATION LIMITATIONS: occupation  PERSONAL FACTORS 3+ comorbidities: OSA, DM with neuropathy, depression, HTN  are also affecting patient's functional outcome.   REHAB POTENTIAL: Good  CLINICAL DECISION MAKING: Evolving/moderate complexity  EVALUATION COMPLEXITY: Moderate   GOALS: Goals reviewed with patient? Yes  LONG TERM GOALS: Target date: 08/17/2021  To be determined if pt returns   PLAN: PT FREQUENCY:  will see up to 1x/wk if needed  PT DURATION: 4 weeks  PLANNED INTERVENTIONS: Therapeutic exercises, Therapeutic activity, Neuromuscular re-education, Patient/Family education, Joint  mobilization, Dry Needling, Electrical stimulation, Spinal mobilization, Cryotherapy, Moist heat, Taping, Traction, and Manual therapy  PLAN FOR NEXT SESSION: if pt returns review HEP and continue with postural exercises, manual/modalities PRN, if pt does not return then d/c PT     Clarita Crane, PT, DPT 07/20/21 9:31 AM

## 2021-07-20 ENCOUNTER — Other Ambulatory Visit: Payer: Self-pay

## 2021-07-20 ENCOUNTER — Encounter: Payer: Self-pay | Admitting: Physical Therapy

## 2021-07-20 ENCOUNTER — Ambulatory Visit: Payer: Medicare Other | Admitting: Physical Therapy

## 2021-07-20 DIAGNOSIS — R293 Abnormal posture: Secondary | ICD-10-CM | POA: Diagnosis not present

## 2021-07-20 DIAGNOSIS — M542 Cervicalgia: Secondary | ICD-10-CM | POA: Diagnosis not present

## 2021-07-26 DIAGNOSIS — E1169 Type 2 diabetes mellitus with other specified complication: Secondary | ICD-10-CM | POA: Diagnosis not present

## 2021-07-26 DIAGNOSIS — M25559 Pain in unspecified hip: Secondary | ICD-10-CM | POA: Diagnosis not present

## 2021-07-26 DIAGNOSIS — F17200 Nicotine dependence, unspecified, uncomplicated: Secondary | ICD-10-CM | POA: Diagnosis not present

## 2021-07-26 DIAGNOSIS — K743 Primary biliary cirrhosis: Secondary | ICD-10-CM | POA: Diagnosis not present

## 2021-07-27 DIAGNOSIS — L820 Inflamed seborrheic keratosis: Secondary | ICD-10-CM | POA: Diagnosis not present

## 2021-09-05 DIAGNOSIS — R21 Rash and other nonspecific skin eruption: Secondary | ICD-10-CM | POA: Diagnosis not present

## 2021-09-05 DIAGNOSIS — S50862A Insect bite (nonvenomous) of left forearm, initial encounter: Secondary | ICD-10-CM | POA: Diagnosis not present

## 2021-09-05 DIAGNOSIS — W57XXXA Bitten or stung by nonvenomous insect and other nonvenomous arthropods, initial encounter: Secondary | ICD-10-CM | POA: Diagnosis not present

## 2021-10-04 ENCOUNTER — Telehealth: Payer: Self-pay | Admitting: *Deleted

## 2021-10-04 DIAGNOSIS — E669 Obesity, unspecified: Secondary | ICD-10-CM | POA: Diagnosis not present

## 2021-10-04 DIAGNOSIS — E1169 Type 2 diabetes mellitus with other specified complication: Secondary | ICD-10-CM | POA: Diagnosis not present

## 2021-10-04 NOTE — Telephone Encounter (Signed)
I called patient regarding the below. I explained to patient to patient last dexa in 05/2020 reports repeat in 5 years. Patient states she has an autoimmune disease and recommends dexa every 2 years.  Per Dr.Kendall 05/18/21 note "Next DEXA recommended per her GI due to a history of PBC increased risk for osteoporosis"  Patient said she will follow up for dexa next year 2024

## 2021-10-04 NOTE — Telephone Encounter (Signed)
-----   Message from [facility] sent at 10/04/2021 10:16 AM EDT ----- Alexa Buchanan, inbound call from this pt stating she received a letter in the mail saying it's time to schedule DEXA in October.  Last DEXA was 06/22/2020.  She stated something about needing to have them sooner.  Can you please help.

## 2021-11-03 DIAGNOSIS — L659 Nonscarring hair loss, unspecified: Secondary | ICD-10-CM | POA: Diagnosis not present

## 2021-11-03 DIAGNOSIS — Z79899 Other long term (current) drug therapy: Secondary | ICD-10-CM | POA: Diagnosis not present

## 2021-11-03 DIAGNOSIS — E1169 Type 2 diabetes mellitus with other specified complication: Secondary | ICD-10-CM | POA: Diagnosis not present

## 2021-11-03 DIAGNOSIS — R7989 Other specified abnormal findings of blood chemistry: Secondary | ICD-10-CM | POA: Diagnosis not present

## 2021-11-08 DIAGNOSIS — E669 Obesity, unspecified: Secondary | ICD-10-CM | POA: Diagnosis not present

## 2021-11-08 DIAGNOSIS — E1169 Type 2 diabetes mellitus with other specified complication: Secondary | ICD-10-CM | POA: Diagnosis not present

## 2021-11-08 DIAGNOSIS — Z0000 Encounter for general adult medical examination without abnormal findings: Secondary | ICD-10-CM | POA: Diagnosis not present

## 2021-11-08 DIAGNOSIS — K743 Primary biliary cirrhosis: Secondary | ICD-10-CM | POA: Diagnosis not present

## 2021-11-08 DIAGNOSIS — R82998 Other abnormal findings in urine: Secondary | ICD-10-CM | POA: Diagnosis not present

## 2021-11-10 ENCOUNTER — Other Ambulatory Visit: Payer: Self-pay | Admitting: Internal Medicine

## 2021-11-10 DIAGNOSIS — F17200 Nicotine dependence, unspecified, uncomplicated: Secondary | ICD-10-CM

## 2021-11-22 DIAGNOSIS — N3941 Urge incontinence: Secondary | ICD-10-CM | POA: Diagnosis not present

## 2021-11-22 DIAGNOSIS — R3915 Urgency of urination: Secondary | ICD-10-CM | POA: Diagnosis not present

## 2021-11-22 DIAGNOSIS — Z87440 Personal history of urinary (tract) infections: Secondary | ICD-10-CM | POA: Diagnosis not present

## 2021-11-22 DIAGNOSIS — R3129 Other microscopic hematuria: Secondary | ICD-10-CM | POA: Diagnosis not present

## 2021-11-22 DIAGNOSIS — N3010 Interstitial cystitis (chronic) without hematuria: Secondary | ICD-10-CM | POA: Diagnosis not present

## 2021-11-22 DIAGNOSIS — M549 Dorsalgia, unspecified: Secondary | ICD-10-CM | POA: Diagnosis not present

## 2021-11-22 DIAGNOSIS — R350 Frequency of micturition: Secondary | ICD-10-CM | POA: Diagnosis not present

## 2021-11-22 DIAGNOSIS — R399 Unspecified symptoms and signs involving the genitourinary system: Secondary | ICD-10-CM | POA: Diagnosis not present

## 2021-11-22 DIAGNOSIS — R300 Dysuria: Secondary | ICD-10-CM | POA: Diagnosis not present

## 2021-11-23 DIAGNOSIS — N200 Calculus of kidney: Secondary | ICD-10-CM | POA: Diagnosis not present

## 2021-12-05 DIAGNOSIS — N200 Calculus of kidney: Secondary | ICD-10-CM | POA: Diagnosis not present

## 2021-12-07 ENCOUNTER — Ambulatory Visit
Admission: RE | Admit: 2021-12-07 | Discharge: 2021-12-07 | Disposition: A | Discharge status: Home / Self Care | Payer: Medicare Other | Source: Ambulatory Visit | Attending: Internal Medicine | Admitting: Internal Medicine

## 2021-12-07 DIAGNOSIS — F17200 Nicotine dependence, unspecified, uncomplicated: Secondary | ICD-10-CM

## 2021-12-13 ENCOUNTER — Other Ambulatory Visit: Payer: Self-pay | Admitting: Obstetrics and Gynecology

## 2021-12-13 DIAGNOSIS — Z1231 Encounter for screening mammogram for malignant neoplasm of breast: Secondary | ICD-10-CM

## 2021-12-13 DIAGNOSIS — N3010 Interstitial cystitis (chronic) without hematuria: Secondary | ICD-10-CM | POA: Diagnosis not present

## 2021-12-14 ENCOUNTER — Encounter: Payer: Self-pay | Admitting: Orthopaedic Surgery

## 2021-12-14 ENCOUNTER — Ambulatory Visit: Payer: Medicare Other | Admitting: Orthopaedic Surgery

## 2021-12-14 DIAGNOSIS — M542 Cervicalgia: Secondary | ICD-10-CM | POA: Diagnosis not present

## 2021-12-14 DIAGNOSIS — R29898 Other symptoms and signs involving the musculoskeletal system: Secondary | ICD-10-CM

## 2021-12-14 DIAGNOSIS — G8929 Other chronic pain: Secondary | ICD-10-CM

## 2021-12-14 DIAGNOSIS — M545 Low back pain, unspecified: Secondary | ICD-10-CM | POA: Diagnosis not present

## 2021-12-14 DIAGNOSIS — M47812 Spondylosis without myelopathy or radiculopathy, cervical region: Secondary | ICD-10-CM | POA: Diagnosis not present

## 2021-12-14 DIAGNOSIS — M5442 Lumbago with sciatica, left side: Secondary | ICD-10-CM

## 2021-12-14 DIAGNOSIS — M5441 Lumbago with sciatica, right side: Secondary | ICD-10-CM

## 2021-12-14 NOTE — Progress Notes (Signed)
Office Visit Note   Patient: Alexa Buchanan           Date of Birth: 02-Mar-1950           MRN: [id number] Visit Date: 12/14/2021              Requested by: Alysia Penna, MD [address] [place],  [place] [address] PCP: Alysia Penna, MD   Assessment & Plan: Visit Diagnoses:  1. Bilateral arm weakness   2. Neck pain   3. Low back pain, unspecified back pain laterality, unspecified chronicity, unspecified whether sciatica present   4. Spondylosis of cervical region without myelopathy or radiculopathy   5. Chronic left-sided low back pain with bilateral sciatica     Plan: Alexa Buchanan been experiencing neck pain on a chronic basis and since August weakness in both of her upper extremities some cognitive issues and back pain associated with some discomfort in both lower extremities.  She has been seen by the urologist with a CAT scan demonstrating some calcification in the kidneys but they did not think it was responsible for her present back pain.  She had prior films of her cervical spine demonstrating diffuse degenerative arthritis.  She does have limitation of motion but does not have any referred pain.  She also has had films of the lumbar spine in the past demonstrating diffuse degenerative changes.  This certainly could be a contributing to her lower extremity discomfort.  I am going to order an MRI of her lumbar spine, EMGs and nerve conduction studies of both upper extremities and consult neurology.  Spent 1 hour in discussing all of the above.  Follow-Up Instructions: Return After EMGs nerve conduction studies of both upper extremities and MRI scan of lumbar spine.   Orders:  Orders Placed This Encounter  Procedures   MR Lumbar Spine w/o contrast   Ambulatory referral to Neurology   Ambulatory referral to Physical Medicine Rehab   No orders of the defined types were placed in this encounter.     Procedures: No procedures performed   Clinical Data: No additional  findings.   Subjective: Chief Complaint  Patient presents with   Right Leg - Weakness   Left Leg - Weakness  Alexa Buchanan has been experiencing some cognitive changes since August without injury or trauma.  She is had a problem with her balance and even some weakness of her upper extremities.  In addition she is experiencing low back pain with some discomfort in the both lower extremities.  She has been seen by her urologist with CT scan demonstrating some calcification in the area of her kidneys but it was felt that this was not responsible for her back pain.  She has not experienced any numbness or tingling to either upper extremity and does not have any trouble when she was driving her car even at night.  She feels like her dexterity has been compromised even typing on the computer or writing.  She also feels like she is a little dizzy when she gets up from a sitting position or moves too quickly to the right and to the left  HPI  Review of Systems   Objective: Vital Signs: LMP 02/28/1995   Physical Exam Constitutional:      Appearance: She is well-developed.  Eyes:     Pupils: Pupils are equal, round, and reactive to light.  Pulmonary:     Effort: Pulmonary effort is normal.  Skin:    General: Skin is warm  and dry.  Neurological:     Mental Status: She is alert and oriented to person, place, and time.  Psychiatric:        Behavior: Behavior normal.     Ortho Exam awake alert and oriented x3.  Comfortable sitting and in no acute distress.  Looks a little older than her stated age.  She has limited range of motion of the cervical spine barely able to touch her chin to her chest with stiffness but little if any discomfort.  She only had about 30 to 40% of normal neck extension but again no referred pain to the interscapular region or either shoulder or upper extremity.  She had about 50 to 60% normal rotation of the right and to the left.  She was able to place her arms over her  head.  She I thought her grip strength was a little decreased.  No Tinel's or Phalen's over the median nerves.  Good capillary refill to her fingers.  Reflexes are symmetrical except for slightly decreased right biceps reflex.  Mild percussible tenderness lumbar spine.  Straight leg raise negative.  Reflexes seem to be a little decreased right ankle and right knee compared to the left motor exam appeared to be intact.  No pain with range of motion of either hip  Specialty Comments:  No specialty comments available.  Imaging: No results found.   PMFS History: Patient Active Problem List   Diagnosis Date Noted   Low back pain 12/14/2021   Osteoarthritis cervical spine 07/06/2021   Acute bronchitis 02/10/2014   Smoker 11/12/2013   Type II or unspecified type diabetes mellitus without mention of complication, uncontrolled 12/03/2012   Elevated blood pressure reading without diagnosis of hypertension 09/04/2012   OSA (obstructive sleep apnea) 07/27/2011   Insomnia 07/27/2011   Wheezing 07/27/2011   Unspecified adverse effect of unspecified drug, medicinal and biological substance 07/04/2011   Vitamin D deficiency 12/29/2010   CERVICAL RADICULOPATHY, RIGHT 04/21/2010   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   DIVERTICULOSIS, COLON 36/46/8032   DYSMETABOLIC SYNDROME X 02/19/8249   IBS 09/10/2007   Uncontrolled secondary diabetes with peripheral neuropathy 02/04/2007   DEPRESSION 02/04/2007   MIGRAINE HEADACHE 02/04/2007   STRESS INCONTINENCE 02/04/2007   HISTOPLASMOSIS, HX OF 02/04/2007   LIVER FUNCTION TESTS, ABNORMAL, HX OF 02/04/2007   COLITIS, HX OF 02/04/2007   Past Medical History:  Diagnosis Date   Arthritis    Complication of anesthesia    woke up to soon.  reported "bad gag reflex"      Diabetes mellitus    Type 2   Diverticulosis    Dysmetabolic syndrome X    Histoplasmosis 1971   IBS (irritable bowel syndrome)    Liver disease    PBC   Migraines    ice pick headaches,  Dr. Beacher May   OSA (obstructive sleep apnea) 07/27/2011   Home sleep study 08/18/11 >> AHI 8.1, SpO2 low 74%.  pt stated she does not have sleep apnes   Seasonal allergies    UTI symptoms     Family History  Problem Relation Age of Onset   Leukemia Father        CLL   Hyperlipidemia Mother    Multiple sclerosis Mother    Hypertension Mother    Diabetes Maternal Grandmother        also had malignant lower neck mass of unknown etiology   Breast cancer Sister        age 40   Hypertension Sister  related to migraines   Migraines Sister    Stroke Maternal Grandfather 32   Heart disease Maternal Aunt        porcine valve   Heart disease Maternal Uncle        valvular disease   Colon cancer Neg Hx    Stomach cancer Neg Hx    Pancreatic cancer Neg Hx    Esophageal cancer Neg Hx     Past Surgical History:  Procedure Laterality Date   BREAST EXCISIONAL BIOPSY Right    BREAST LUMPECTOMY WITH RADIOACTIVE SEED LOCALIZATION Right 07/22/2015   Procedure: BREAST LUMPECTOMY WITH RADIOACTIVE SEED LOCALIZATION;  Surgeon: Abigail Miyamoto, MD;  Location: [facility] OR;  Service: General;  Laterality: Right;   COLONOSCOPY     Tics; Dr Juanda Chance   DILATION AND CURETTAGE OF UTERUS     Gastric Plication  1981   KNEE ARTHROSCOPY Left 1990   LASER ABLATION OF VASCULAR LESION  2009   Dr. Dorma Russell, wart removed from nose   RECTOCELE REPAIR  2003   with vaginal wall repair and sling   ROTATOR CUFF REPAIR Right 2016   TONSILLECTOMY     1982/1992   VAGINAL HYSTERECTOMY  2003   Vag Hyst,BSO,Sling, posterior repair   VULVA SURGERY     keratoses   WRIST SURGERY Left 1988    tendon repair   Social History   Occupational History   Occupation: Orthoptist  Tobacco Use   Smoking status: Every Day    Packs/day: 0.50    Years: 30.00    Total pack years: 15.00    Types: Cigarettes   Smokeless tobacco: Never   Tobacco comments:    Smoked 1968-present; up to 1 ppd  Vaping Use   Vaping Use: Former   Substance and Sexual Activity   Alcohol use: No   Drug use: No   Sexual activity: Not Currently    Birth control/protection: Surgical    Comment: 1st intercourse 71 yo-Fewer than 5 partners     Valeria Batman, MD   Note - This record has been created using Animal nutritionist.  Chart creation errors have been sought, but may not always  have been located. Such creation errors do not reflect on  the standard of medical care.

## 2021-12-21 ENCOUNTER — Ambulatory Visit (INDEPENDENT_AMBULATORY_CARE_PROVIDER_SITE_OTHER): Payer: Medicare Other | Admitting: Physical Medicine and Rehabilitation

## 2021-12-21 DIAGNOSIS — R202 Paresthesia of skin: Secondary | ICD-10-CM | POA: Diagnosis not present

## 2021-12-21 NOTE — Progress Notes (Signed)
Numeric Pain Rating Scale and Functional Assessment Average Pain  varies   In the last MONTH (on 0-10 scale) has pain interfered with the following?  1. General activity like being  able to carry out your everyday physical activities such as walking, climbing stairs, carrying groceries, or moving a chair?  Rating( varies )     "Muscles not working in arms", no strength in hands

## 2021-12-22 ENCOUNTER — Encounter: Payer: Self-pay | Admitting: Orthopaedic Surgery

## 2021-12-22 NOTE — Telephone Encounter (Signed)
thanks

## 2021-12-22 NOTE — Telephone Encounter (Signed)
Call and check on status per our discussion

## 2021-12-25 NOTE — Procedures (Unsigned)
EMG & NCV Findings: Evaluation of the left median motor nerve showed prolonged distal onset latency (4.4 ms) and decreased conduction velocity (Elbow-Wrist, 44 m/s).  The right median motor nerve showed prolonged distal onset latency (4.5 ms), reduced amplitude (4.5 mV), and decreased conduction velocity (Elbow-Wrist, 42 m/s).  The left ulnar motor nerve showed decreased conduction velocity (B Elbow-Wrist, 47 m/s) and decreased conduction velocity (A Elbow-B Elbow, 48 m/s).  The right ulnar motor nerve showed prolonged distal onset latency (4.6 ms), decreased conduction velocity (B Elbow-Wrist, 51 m/s), and decreased conduction velocity (A Elbow-B Elbow, 48 m/s).  The left median (across palm) sensory nerve showed prolonged distal peak latency (Wrist, 4.6 ms) and prolonged distal peak latency (Palm, 2.2 ms).  The right median (across palm) sensory nerve showed no response (Palm) and prolonged distal peak latency (4.6 ms).  The left ulnar sensory nerve showed prolonged distal peak latency (4.3 ms), reduced amplitude (14.8 V), and decreased conduction velocity (Wrist-5th Digit, 33 m/s).  The right ulnar sensory nerve showed prolonged distal peak latency (4.4 ms) and decreased conduction velocity (Wrist-5th Digit, 32 m/s).  All remaining nerves (as indicated in the following tables) were within normal limits.  Left vs. Right side comparison data for the ulnar motor nerve indicates abnormal L-R latency difference (0.8 ms).  All remaining left vs. right side differences were within normal limits.    All examined muscles (as indicated in the following table) showed no evidence of electrical instability.    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    There is no significant electrodiagnostic evidence of any other focal nerve entrapment or cervical radiculopathy.   Recommendations: 1.  Follow-up with referring physician. 2.  Continue current management of symptoms.  Consider surgical evaluation.  ___________________________ Laurence Spates FAAPMR Board Certified, American Board of Physical Medicine and Rehabilitation    Nerve Conduction Studies Anti Sensory Summary Table   Stim Site NR Peak (ms) Norm Peak (ms) P-T Amp (V) Norm P-T Amp Site1 Site2 Delta-P (ms) Dist (cm) Vel (m/s) Norm Vel (m/s)  Left Median Acr Palm Anti Sensory (2nd Digit)  30.1C  Wrist    *4.6 <3.6 22.3 >10 Wrist Palm 2.4 0.0    Palm    *2.2 <2.0 9.1         Right Median Acr Palm Anti Sensory (2nd Digit)  28.8C  Wrist    *4.6 <3.6 38.0 >10 Wrist Palm  0.0    Palm *NR  <2.0          Left Radial Anti Sensory (Base 1st Digit)  29.7C  Wrist    2.8 <3.1 9.5  Wrist Base 1st Digit 2.8 0.0    Right Radial Anti Sensory (Base 1st Digit)  28.7C  Wrist    2.7 <3.1 23.6  Wrist Base 1st Digit 2.7 0.0    Left Ulnar Anti Sensory (5th Digit)  30.2C  Wrist    *4.3 <3.7 *14.8 >15.0 Wrist 5th Digit 4.3 14.0 *33 >38  Right Ulnar Anti Sensory (5th Digit)  28.9C  Wrist    *4.4 <3.7 23.6 >15.0 Wrist 5th Digit 4.4 14.0 *32 >38   Motor Summary Table   Stim Site NR Onset (ms) Norm Onset (ms) O-P Amp (mV) Norm O-P Amp Site1 Site2 Delta-0 (ms) Dist (cm) Vel (  m/s) Norm Vel (m/s)  Left Median Motor (Abd Poll Brev)  29.9C  Wrist    *4.4 <4.2 5.8 >5 Elbow Wrist 4.3 19.0 *44 >50  Elbow    8.7  3.7         Right Median Motor (Abd Poll Brev)  28.7C  Wrist    *4.5 <4.2 *4.5 >5 Elbow Wrist 4.5 19.0 *42 >50  Elbow    9.0  2.8         Left Ulnar Motor (Abd Dig Min)  29.8C  Wrist    3.8 <4.2 6.8 >3 B Elbow Wrist 3.4 16.0 *47 >53  B Elbow    7.2  7.1  A Elbow B Elbow 2.1 10.0 *48 >53  A Elbow    9.3  7.0         Right Ulnar Motor (Abd Dig Min)  28.7C  Wrist    *4.6 <4.2 6.2 >3 B  Elbow Wrist 3.5 18.0 *51 >53  B Elbow    8.1  5.8  A Elbow B Elbow 2.1 10.0 *48 >53  A Elbow    10.2  6.4          EMG   Side Muscle Nerve Root Ins Act Fibs Psw Amp Dur Poly Recrt Int Dennie Bible Comment  Right Abd Poll Brev Median C8-T1 Nml Nml Nml Nml Nml 0 Nml Nml   Right 1stDorInt Ulnar C8-T1 Nml Nml Nml Nml Nml 0 Nml Nml   Right PronatorTeres Median C6-7 Nml Nml Nml Nml Nml 0 Nml Nml   Right Biceps Musculocut C5-6 Nml Nml Nml Nml Nml 0 Nml Nml   Right Deltoid Axillary C5-6 Nml Nml Nml Nml Nml 0 Nml Nml     Nerve Conduction Studies Anti Sensory Left/Right Comparison   Stim Site L Lat (ms) R Lat (ms) L-R Lat (ms) L Amp (V) R Amp (V) L-R Amp (%) Site1 Site2 L Vel (m/s) R Vel (m/s) L-R Vel (m/s)  Median Acr Palm Anti Sensory (2nd Digit)  30.1C  Wrist *4.6 *4.6 0.0 22.3 38.0 41.3 Wrist Palm     Palm *2.2   9.1         Radial Anti Sensory (Base 1st Digit)  29.7C  Wrist 2.8 2.7 0.1 9.5 23.6 59.7 Wrist Base 1st Digit     Ulnar Anti Sensory (5th Digit)  30.2C  Wrist *4.3 *4.4 0.1 *14.8 23.6 37.3 Wrist 5th Digit *33 *32 1   Motor Left/Right Comparison   Stim Site L Lat (ms) R Lat (ms) L-R Lat (ms) L Amp (mV) R Amp (mV) L-R Amp (%) Site1 Site2 L Vel (m/s) R Vel (m/s) L-R Vel (m/s)  Median Motor (Abd Poll Brev)  29.9C  Wrist *4.4 *4.5 0.1 5.8 *4.5 22.4 Elbow Wrist *44 *42 2  Elbow 8.7 9.0 0.3 3.7 2.8 24.3       Ulnar Motor (Abd Dig Min)  29.8C  Wrist 3.8 *4.6 *0.8 6.8 6.2 8.8 B Elbow Wrist *47 *51 4  B Elbow 7.2 8.1 0.9 7.1 5.8 18.3 A Elbow B Elbow *48 *48 0  A Elbow 9.3 10.2 0.9 7.0 6.4 8.6          Waveforms:

## 2021-12-25 NOTE — Progress Notes (Unsigned)
Alexa Buchanan - 71 y.o. female MRN [id number]  Date of birth: 01/10/1951  Office Visit Note: Visit Date: 12/21/2021 PCP: Alysia Penna, MD Referred by: Valeria Batman, MD  Subjective: Chief Complaint  Patient presents with   Left Arm - Weakness   Right Arm - Weakness   HPI:  Alexa Buchanan is a 71 y.o. female who comes in todayHPI ROS Otherwise per HPI.  Assessment & Plan: Visit Diagnoses:    ICD-10-CM   1. 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   There is no significant electrodiagnostic evidence of any other focal nerve entrapment or cervical radiculopathy.   Recommendations: 1.  Follow-up with referring physician. 2.  Continue current management of symptoms.  Consider surgical evaluation.  Meds & Orders: No orders of the defined types were placed in this encounter.   Orders Placed This Encounter  Procedures   NCV with EMG (electromyography)    Follow-up: No follow-ups on file.   Procedures: No procedures performed  EMG & NCV Findings: Evaluation of the left median motor nerve showed prolonged distal onset latency (4.4 ms) and decreased conduction velocity (Elbow-Wrist, 44 m/s).  The right median motor nerve showed prolonged distal onset latency (4.5 ms), reduced amplitude (4.5 mV), and decreased conduction velocity (Elbow-Wrist, 42 m/s).  The left ulnar motor nerve showed decreased conduction velocity (B Elbow-Wrist, 47 m/s) and decreased conduction velocity (A Elbow-B Elbow, 48 m/s).  The right ulnar motor nerve showed prolonged distal onset latency (4.6 ms), decreased conduction velocity (B Elbow-Wrist, 51 m/s), and  decreased conduction velocity (A Elbow-B Elbow, 48 m/s).  The left median (across palm) sensory nerve showed prolonged distal peak latency (Wrist, 4.6 ms) and prolonged distal peak latency (Palm, 2.2 ms).  The right median (across palm) sensory nerve showed no response (Palm) and prolonged distal peak latency (4.6 ms).  The left ulnar sensory nerve showed prolonged distal peak latency (4.3 ms), reduced amplitude (14.8 V), and decreased conduction velocity (Wrist-5th Digit, 33 m/s).  The right ulnar sensory nerve showed prolonged distal peak latency (4.4 ms) and decreased conduction velocity (Wrist-5th Digit, 32 m/s).  All remaining nerves (as indicated in the following tables) were within normal limits.  Left vs. Right side comparison data for the ulnar motor nerve indicates abnormal L-R latency difference (0.8 ms).  All remaining left vs. right side differences were within normal limits.    All examined muscles (as indicated in the following table) showed no evidence of electrical instability.    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   There is no significant electrodiagnostic evidence of any other focal nerve entrapment or cervical radiculopathy.   Recommendations: 1.  Follow-up with referring physician. 2.  Continue current management of symptoms.  Consider surgical evaluation.  ___________________________ [name] Board Certified, American Board of Physical Medicine and Rehabilitation    Nerve Conduction Studies Anti Sensory Summary Table   Stim Site NR Peak (ms) Norm Peak (ms) P-T Amp (V) Norm P-T  Amp Site1 Site2 Delta-P (ms) Dist (cm) Vel (m/s) Norm Vel (m/s)  Left Median Acr Palm Anti Sensory (2nd Digit)   30.1C  Wrist    *4.6 <3.6 22.3 >10 Wrist Palm 2.4 0.0    Palm    *2.2 <2.0 9.1         Right Median Acr Palm Anti Sensory (2nd Digit)  28.8C  Wrist    *4.6 <3.6 38.0 >10 Wrist Palm  0.0    Palm *NR  <2.0          Left Radial Anti Sensory (Base 1st Digit)  29.7C  Wrist    2.8 <3.1 9.5  Wrist Base 1st Digit 2.8 0.0    Right Radial Anti Sensory (Base 1st Digit)  28.7C  Wrist    2.7 <3.1 23.6  Wrist Base 1st Digit 2.7 0.0    Left Ulnar Anti Sensory (5th Digit)  30.2C  Wrist    *4.3 <3.7 *14.8 >15.0 Wrist 5th Digit 4.3 14.0 *33 >38  Right Ulnar Anti Sensory (5th Digit)  28.9C  Wrist    *4.4 <3.7 23.6 >15.0 Wrist 5th Digit 4.4 14.0 *32 >38   Motor Summary Table   Stim Site NR Onset (ms) Norm Onset (ms) O-P Amp (mV) Norm O-P Amp Site1 Site2 Delta-0 (ms) Dist (cm) Vel (m/s) Norm Vel (m/s)  Left Median Motor (Abd Poll Brev)  29.9C  Wrist    *4.4 <4.2 5.8 >5 Elbow Wrist 4.3 19.0 *44 >50  Elbow    8.7  3.7         Right Median Motor (Abd Poll Brev)  28.7C  Wrist    *4.5 <4.2 *4.5 >5 Elbow Wrist 4.5 19.0 *42 >50  Elbow    9.0  2.8         Left Ulnar Motor (Abd Dig Min)  29.8C  Wrist    3.8 <4.2 6.8 >3 B Elbow Wrist 3.4 16.0 *47 >53  B Elbow    7.2  7.1  A Elbow B Elbow 2.1 10.0 *48 >53  A Elbow    9.3  7.0         Right Ulnar Motor (Abd Dig Min)  28.7C  Wrist    *4.6 <4.2 6.2 >3 B Elbow Wrist 3.5 18.0 *51 >53  B Elbow    8.1  5.8  A Elbow B Elbow 2.1 10.0 *48 >53  A Elbow    10.2  6.4          EMG   Side Muscle Nerve Root Ins Act Fibs Psw Amp Dur Poly Recrt Int Dennie Bible Comment  Right Abd Poll Brev Median C8-T1 Nml Nml Nml Nml Nml 0 Nml Nml   Right 1stDorInt Ulnar C8-T1 Nml Nml Nml Nml Nml 0 Nml Nml   Right PronatorTeres Median C6-7 Nml Nml Nml Nml Nml 0 Nml Nml   Right Biceps Musculocut C5-6 Nml Nml Nml Nml Nml 0 Nml Nml   Right Deltoid Axillary C5-6 Nml Nml Nml Nml Nml 0 Nml Nml     Nerve Conduction Studies Anti Sensory Left/Right Comparison   Stim Site L Lat (ms) R Lat (ms)  L-R Lat (ms) L Amp (V) R Amp (V) L-R Amp (%) Site1 Site2 L Vel (m/s) R Vel (m/s) L-R Vel (m/s)  Median Acr Palm Anti Sensory (2nd Digit)  30.1C  Wrist *4.6 *4.6 0.0 22.3 38.0 41.3 Wrist Palm     Palm *2.2   9.1         Radial Anti  Sensory (Base 1st Digit)  29.7C  Wrist 2.8 2.7 0.1 9.5 23.6 59.7 Wrist Base 1st Digit     Ulnar Anti Sensory (5th Digit)  30.2C  Wrist *4.3 *4.4 0.1 *14.8 23.6 37.3 Wrist 5th Digit *33 *32 1   Motor Left/Right Comparison   Stim Site L Lat (ms) R Lat (ms) L-R Lat (ms) L Amp (mV) R Amp (mV) L-R Amp (%) Site1 Site2 L Vel (m/s) R Vel (m/s) L-R Vel (m/s)  Median Motor (Abd Poll Brev)  29.9C  Wrist *4.4 *4.5 0.1 5.8 *4.5 22.4 Elbow Wrist *44 *42 2  Elbow 8.7 9.0 0.3 3.7 2.8 24.3       Ulnar Motor (Abd Dig Min)  29.8C  Wrist 3.8 *4.6 *0.8 6.8 6.2 8.8 B Elbow Wrist *47 *51 4  B Elbow 7.2 8.1 0.9 7.1 5.8 18.3 A Elbow B Elbow *48 *48 0  A Elbow 9.3 10.2 0.9 7.0 6.4 8.6          Waveforms:                      Clinical History: No specialty comments available.     Objective:  VS:  HT:    WT:   BMI:     BP:   HR: bpm  TEMP: ( )  RESP:  Physical Exam   Imaging: No results found.

## 2022-01-01 ENCOUNTER — Ambulatory Visit
Admission: RE | Admit: 2022-01-01 | Discharge: 2022-01-01 | Disposition: A | Discharge status: Home / Self Care | Payer: Medicare Other | Source: Ambulatory Visit | Attending: Orthopaedic Surgery | Admitting: Orthopaedic Surgery

## 2022-01-01 DIAGNOSIS — M545 Low back pain, unspecified: Secondary | ICD-10-CM | POA: Diagnosis not present

## 2022-01-01 DIAGNOSIS — M48061 Spinal stenosis, lumbar region without neurogenic claudication: Secondary | ICD-10-CM | POA: Diagnosis not present

## 2022-01-01 DIAGNOSIS — R29898 Other symptoms and signs involving the musculoskeletal system: Secondary | ICD-10-CM | POA: Diagnosis not present

## 2022-01-03 ENCOUNTER — Ambulatory Visit: Payer: Medicare Other | Admitting: Orthopaedic Surgery

## 2022-01-03 ENCOUNTER — Encounter: Payer: Self-pay | Admitting: Orthopaedic Surgery

## 2022-01-03 DIAGNOSIS — G8929 Other chronic pain: Secondary | ICD-10-CM | POA: Diagnosis not present

## 2022-01-03 DIAGNOSIS — M5442 Lumbago with sciatica, left side: Secondary | ICD-10-CM | POA: Diagnosis not present

## 2022-01-03 DIAGNOSIS — G629 Polyneuropathy, unspecified: Secondary | ICD-10-CM | POA: Diagnosis not present

## 2022-01-03 DIAGNOSIS — M5441 Lumbago with sciatica, right side: Secondary | ICD-10-CM | POA: Diagnosis not present

## 2022-01-03 NOTE — Progress Notes (Signed)
Office Visit Note   Patient: Alexa Buchanan           Date of Birth: October 19, 1950           MRN: [id number] Visit Date: 01/03/2022              Requested by: Alysia Penna, MD [address] [place],  [place] [address] PCP: Alysia Penna, MD   Assessment & Plan: Visit Diagnoses:  1. Chronic left-sided low back pain with bilateral sciatica   2. Neuropathy     Plan: Alexa Buchanan accompanied by her husband and here for follow-up evaluation of bilateral upper extremity numbness tingling and weakness as well is low back pain associated with some nuclear pain of both lower extremities.  She has had EMGs and nerve conduction studies and an MRI of her lumbar spine.  The EMGs demonstrated a moderate bilateral median nerve entrapment at the wrist.  She has had prior decompression through Dr. Budd Palmer office many years ago.  There was also an underlying sensorimotor peripheral neuropathy but I think combination would certainly explain a lot of the numbness tingling and weakness in her upper extremities.  There was no evidence of any other focal nerve entrapment or cervical radiculopathy.  She has an appointment to see the neurologist in the next month and hopefully they can shed some light on all of the above.  She does take Ozempic and metformin as she is "prediabetic".  MRI scan of the lumbar spine was compared to a study that was performed in 2013 this demonstrated progression of the arthritis and stenosis since the last study 10 years ago.  More specifically, there was disc bulging and advanced left worse than right facet arthropathy at L4-5 resulting in mild to moderate spinal canal stenosis with left worse than right subarticular zone narrowing and moderate left and mild right neuroforaminal stenosis.  Moderate left worse than right facet arthropathy at L5-S1 resulting in moderate left and mild right neuroforaminal stenosis.  Mild spinal canal and bilateral subarticular zone narrowing and mild to  moderate left worse than right neuroforaminal stenosis at L 3-L4.  Mild bilateral perifacet soft tissue edema at L3-4 and L4-5 and degenerative reactive marrow edema in the L5 posterior elements left worse than right.  These findings might reflect a source of pain as well.  I think that the above would explain a lot of her back pain and possibly into weakness of both of her lower extremities more on the left than the right.  I had suggested she might want to consider an epidural steroid injection but she is going to wait to see the neurologist first.  Long discussion regarding all of the above for almost an hour I will give her a prescription for an adjustable single-point cane and a rolling walker with hand brakes.  She has an appointment to see the neurologist in a month and will follow-up with her primary care physician in the interim  Follow-Up Instructions: Return After evaluation by neurology.   Orders:  No orders of the defined types were placed in this encounter.  No orders of the defined types were placed in this encounter.     Procedures: No procedures performed   Clinical Data: No additional findings.   Subjective: Chief Complaint  Patient presents with   Lower Back - Follow-up  Returns for review of the EMGs and nerve conduction studies of both upper extremities and the MRI scan of the lumbar spine.  Symptoms have not changed.  She has lost about 40 pounds over a period of at least a year that might be related to [place].  She also relates number of other neurologic deficits i.e. mentation, headaches, etc..  Not much change since her last office visit several weeks ago  HPI  Review of Systems   Objective: Vital Signs: LMP 02/28/1995   Physical Exam Constitutional:      Appearance: She is well-developed.  Pulmonary:     Effort: Pulmonary effort is normal.  Skin:    General: Skin is warm and dry.  Neurological:     Mental Status: She is alert and oriented to person,  place, and time.  Psychiatric:        Behavior: Behavior normal.     Ortho Exam awake alert and oriented x3.  Comfortable sitting.  Very slow in her verbal responses.  Does have some mild Tinel's over the median nerve at both wrist where she has had prior carpal tunnel surgery.  Some arthritic changes in her hands.  Some weakness of opposition of thumb little finger and did have some altered sensibility in all of her fingers which may be related to her neuropathy.  No referred pain to either upper extremity motion of her neck.  Straight leg raise is negative.  There were some areas of percussible tenderness along the lower lumbar spine.  Painless range of motion both hips and both knees.  Specialty Comments:  No specialty comments available.  Imaging: No results found.   PMFS History: Patient Active Problem List   Diagnosis Date Noted   Neuropathy 01/03/2022   Low back pain 12/14/2021   Osteoarthritis cervical spine 07/06/2021   Mixed conductive and sensorineural hearing loss of left ear with restricted hearing of right ear 04/07/2021   Pulsatile tinnitus, left ear 02/12/2020   Acute bronchitis 02/10/2014   Smoker 11/12/2013   Type II or unspecified type diabetes mellitus without mention of complication, uncontrolled 12/03/2012   Elevated blood pressure reading without diagnosis of hypertension 09/04/2012   OSA (obstructive sleep apnea) 07/27/2011   Insomnia 07/27/2011   Wheezing 07/27/2011   Unspecified adverse effect of unspecified drug, medicinal and biological substance 07/04/2011   Vitamin D deficiency 12/29/2010   CERVICAL RADICULOPATHY, RIGHT 04/21/2010   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   DIVERTICULOSIS, COLON 35/46/5681   DYSMETABOLIC SYNDROME X 27/51/7001   IBS 09/10/2007   Uncontrolled secondary diabetes with peripheral neuropathy 02/04/2007   DEPRESSION 02/04/2007   MIGRAINE HEADACHE 02/04/2007   STRESS INCONTINENCE 02/04/2007   HISTOPLASMOSIS, HX OF 02/04/2007    LIVER FUNCTION TESTS, ABNORMAL, HX OF 02/04/2007   COLITIS, HX OF 02/04/2007   Past Medical History:  Diagnosis Date   Arthritis    Complication of anesthesia    woke up to soon.  reported "bad gag reflex"      Diabetes mellitus    Type 2   Diverticulosis    Dysmetabolic syndrome X    Histoplasmosis 1971   IBS (irritable bowel syndrome)    Liver disease    PBC   Migraines    ice pick headaches, Dr. Beacher May   OSA (obstructive sleep apnea) 07/27/2011   Home sleep study 08/18/11 >> AHI 8.1, SpO2 low 74%.  pt stated she does not have sleep apnes   Seasonal allergies    UTI symptoms     Family History  Problem Relation Age of Onset   Leukemia Father        CLL   Hyperlipidemia Mother    Multiple sclerosis Mother  Hypertension Mother    Diabetes Maternal Grandmother        also had malignant lower neck mass of unknown etiology   Breast cancer Sister        age 47   Hypertension Sister        related to migraines   Migraines Sister    Stroke Maternal Grandfather 48   Heart disease Maternal Aunt        porcine valve   Heart disease Maternal Uncle        valvular disease   Colon cancer Neg Hx    Stomach cancer Neg Hx    Pancreatic cancer Neg Hx    Esophageal cancer Neg Hx     Past Surgical History:  Procedure Laterality Date   BREAST EXCISIONAL BIOPSY Right    BREAST LUMPECTOMY WITH RADIOACTIVE SEED LOCALIZATION Right 07/22/2015   Procedure: BREAST LUMPECTOMY WITH RADIOACTIVE SEED LOCALIZATION;  Surgeon: Abigail Miyamoto, MD;  Location: [facility] OR;  Service: General;  Laterality: Right;   COLONOSCOPY     Tics; Dr Juanda Chance   DILATION AND CURETTAGE OF UTERUS     Gastric Plication  1981   KNEE ARTHROSCOPY Left 1990   LASER ABLATION OF VASCULAR LESION  2009   Dr. Dorma Russell, wart removed from nose   RECTOCELE REPAIR  2003   with vaginal wall repair and sling   ROTATOR CUFF REPAIR Right 2016   TONSILLECTOMY     1982/1992   VAGINAL HYSTERECTOMY  2003   Vag Hyst,BSO,Sling,  posterior repair   VULVA SURGERY     keratoses   WRIST SURGERY Left 1988    tendon repair   Social History   Occupational History   Occupation: Orthoptist  Tobacco Use   Smoking status: Every Day    Packs/day: 0.50    Years: 30.00    Total pack years: 15.00    Types: Cigarettes   Smokeless tobacco: Never   Tobacco comments:    Smoked 1968-present; up to 1 ppd  Vaping Use   Vaping Use: Former  Substance and Sexual Activity   Alcohol use: No   Drug use: No   Sexual activity: Not Currently    Birth control/protection: Surgical    Comment: 1st intercourse 71 yo-Fewer than 5 partners     Valeria Batman, MD   Note - This record has been created using Animal nutritionist.  Chart creation errors have been sought, but may not always  have been located. Such creation errors do not reflect on  the standard of medical care.

## 2022-01-06 DIAGNOSIS — E1140 Type 2 diabetes mellitus with diabetic neuropathy, unspecified: Secondary | ICD-10-CM | POA: Diagnosis not present

## 2022-01-06 DIAGNOSIS — F418 Other specified anxiety disorders: Secondary | ICD-10-CM | POA: Diagnosis not present

## 2022-01-06 DIAGNOSIS — G629 Polyneuropathy, unspecified: Secondary | ICD-10-CM | POA: Diagnosis not present

## 2022-01-06 DIAGNOSIS — M791 Myalgia, unspecified site: Secondary | ICD-10-CM | POA: Diagnosis not present

## 2022-01-06 DIAGNOSIS — M1990 Unspecified osteoarthritis, unspecified site: Secondary | ICD-10-CM | POA: Diagnosis not present

## 2022-01-06 DIAGNOSIS — M48061 Spinal stenosis, lumbar region without neurogenic claudication: Secondary | ICD-10-CM | POA: Diagnosis not present

## 2022-01-06 DIAGNOSIS — E1169 Type 2 diabetes mellitus with other specified complication: Secondary | ICD-10-CM | POA: Diagnosis not present

## 2022-01-07 ENCOUNTER — Encounter (HOSPITAL_COMMUNITY): Payer: Self-pay

## 2022-01-07 ENCOUNTER — Encounter (HOSPITAL_BASED_OUTPATIENT_CLINIC_OR_DEPARTMENT_OTHER): Payer: Self-pay

## 2022-01-07 ENCOUNTER — Other Ambulatory Visit: Payer: Self-pay

## 2022-01-07 ENCOUNTER — Inpatient Hospital Stay (HOSPITAL_BASED_OUTPATIENT_CLINIC_OR_DEPARTMENT_OTHER)
Admission: EM | Admit: 2022-01-07 | Discharge: 2022-01-12 | DRG: 640 | Disposition: A | Discharge status: Home Health | Payer: Medicare Other | Attending: Internal Medicine | Admitting: Internal Medicine

## 2022-01-07 DIAGNOSIS — Z794 Long term (current) use of insulin: Secondary | ICD-10-CM | POA: Diagnosis not present

## 2022-01-07 DIAGNOSIS — Z6826 Body mass index (BMI) 26.0-26.9, adult: Secondary | ICD-10-CM

## 2022-01-07 DIAGNOSIS — D649 Anemia, unspecified: Secondary | ICD-10-CM | POA: Diagnosis not present

## 2022-01-07 DIAGNOSIS — E440 Moderate protein-calorie malnutrition: Secondary | ICD-10-CM | POA: Diagnosis not present

## 2022-01-07 DIAGNOSIS — E8881 Metabolic syndrome: Secondary | ICD-10-CM | POA: Diagnosis present

## 2022-01-07 DIAGNOSIS — E876 Hypokalemia: Secondary | ICD-10-CM | POA: Diagnosis present

## 2022-01-07 DIAGNOSIS — K743 Primary biliary cirrhosis: Secondary | ICD-10-CM | POA: Diagnosis not present

## 2022-01-07 DIAGNOSIS — E1165 Type 2 diabetes mellitus with hyperglycemia: Secondary | ICD-10-CM | POA: Diagnosis not present

## 2022-01-07 DIAGNOSIS — Z8249 Family history of ischemic heart disease and other diseases of the circulatory system: Secondary | ICD-10-CM

## 2022-01-07 DIAGNOSIS — R32 Unspecified urinary incontinence: Secondary | ICD-10-CM | POA: Diagnosis not present

## 2022-01-07 DIAGNOSIS — Z7982 Long term (current) use of aspirin: Secondary | ICD-10-CM | POA: Diagnosis not present

## 2022-01-07 DIAGNOSIS — G43909 Migraine, unspecified, not intractable, without status migrainosus: Secondary | ICD-10-CM | POA: Diagnosis not present

## 2022-01-07 DIAGNOSIS — G4733 Obstructive sleep apnea (adult) (pediatric): Secondary | ICD-10-CM | POA: Diagnosis present

## 2022-01-07 DIAGNOSIS — N170 Acute kidney failure with tubular necrosis: Secondary | ICD-10-CM | POA: Diagnosis not present

## 2022-01-07 DIAGNOSIS — E11649 Type 2 diabetes mellitus with hypoglycemia without coma: Secondary | ICD-10-CM | POA: Diagnosis not present

## 2022-01-07 DIAGNOSIS — Z833 Family history of diabetes mellitus: Secondary | ICD-10-CM | POA: Diagnosis not present

## 2022-01-07 DIAGNOSIS — Z79899 Other long term (current) drug therapy: Secondary | ICD-10-CM | POA: Diagnosis not present

## 2022-01-07 DIAGNOSIS — E860 Dehydration: Secondary | ICD-10-CM | POA: Diagnosis present

## 2022-01-07 DIAGNOSIS — N179 Acute kidney failure, unspecified: Secondary | ICD-10-CM

## 2022-01-07 DIAGNOSIS — F17210 Nicotine dependence, cigarettes, uncomplicated: Secondary | ICD-10-CM | POA: Diagnosis present

## 2022-01-07 DIAGNOSIS — Z7985 Long-term (current) use of injectable non-insulin antidiabetic drugs: Secondary | ICD-10-CM | POA: Diagnosis not present

## 2022-01-07 DIAGNOSIS — I10 Essential (primary) hypertension: Secondary | ICD-10-CM | POA: Diagnosis not present

## 2022-01-07 DIAGNOSIS — Z888 Allergy status to other drugs, medicaments and biological substances status: Secondary | ICD-10-CM | POA: Diagnosis not present

## 2022-01-07 DIAGNOSIS — G8929 Other chronic pain: Secondary | ICD-10-CM | POA: Diagnosis present

## 2022-01-07 DIAGNOSIS — M5412 Radiculopathy, cervical region: Secondary | ICD-10-CM | POA: Diagnosis not present

## 2022-01-07 DIAGNOSIS — M4712 Other spondylosis with myelopathy, cervical region: Secondary | ICD-10-CM | POA: Diagnosis not present

## 2022-01-07 DIAGNOSIS — R9431 Abnormal electrocardiogram [ECG] [EKG]: Secondary | ICD-10-CM | POA: Diagnosis not present

## 2022-01-07 LAB — COMPREHENSIVE METABOLIC PANEL
ALT: 15 U/L (ref 0–44)
ALT: 16 U/L (ref 0–44)
AST: 16 U/L (ref 15–41)
AST: 23 U/L (ref 15–41)
Albumin: 3.9 g/dL (ref 3.5–5.0)
Albumin: 3.1 g/dL — ABNORMAL LOW (ref 3.5–5.0)
Alkaline Phosphatase: 79 U/L (ref 38–126)
Alkaline Phosphatase: 60 U/L (ref 38–126)
Anion gap: 8 (ref 5–15)
Anion gap: 14 (ref 5–15)
BUN: 40 mg/dL — ABNORMAL HIGH (ref 8–23)
BUN: 35 mg/dL — ABNORMAL HIGH (ref 8–23)
CO2: 31 mmol/L (ref 22–32)
CO2: 24 mmol/L (ref 22–32)
Calcium: >15 mg/dL (ref 8.9–10.3)
Calcium: 13 mg/dL — ABNORMAL HIGH (ref 8.9–10.3)
Chloride: 99 mmol/L (ref 98–111)
Chloride: 103 mmol/L (ref 98–111)
Creatinine, Ser: 3.48 mg/dL — ABNORMAL HIGH (ref 0.44–1.00)
Creatinine, Ser: 3.25 mg/dL — ABNORMAL HIGH (ref 0.44–1.00)
GFR, Estimated: 13 mL/min — ABNORMAL LOW (ref >60)
GFR, Estimated: 14 mL/min — ABNORMAL LOW (ref >60)
Glucose, Bld: 94 mg/dL (ref 70–99)
Glucose, Bld: 74 mg/dL (ref 70–99)
Potassium: 4 mmol/L (ref 3.5–5.1)
Potassium: 4 mmol/L (ref 3.5–5.1)
Sodium: 138 mmol/L (ref 135–145)
Sodium: 141 mmol/L (ref 135–145)
Total Bilirubin: 0.6 mg/dL (ref 0.3–1.2)
Total Bilirubin: 0.6 mg/dL (ref 0.3–1.2)
Total Protein: 6.6 g/dL (ref 6.5–8.1)
Total Protein: 5.5 g/dL — ABNORMAL LOW (ref 6.5–8.1)

## 2022-01-07 LAB — CBC WITH DIFFERENTIAL/PLATELET
Abs Immature Granulocytes: 0.02 10*3/uL (ref 0.00–0.07)
Basophils Absolute: 0.1 10*3/uL (ref 0.0–0.1)
Basophils Relative: 1 %
Eosinophils Absolute: 0.2 10*3/uL (ref 0.0–0.5)
Eosinophils Relative: 3 %
HCT: 38.6 % (ref 36.0–46.0)
Hemoglobin: 13.1 g/dL (ref 12.0–15.0)
Immature Granulocytes: 0 %
Lymphocytes Relative: 24 %
Lymphs Abs: 2.1 10*3/uL (ref 0.7–4.0)
MCH: 31.5 pg (ref 26.0–34.0)
MCHC: 33.9 g/dL (ref 30.0–36.0)
MCV: 92.8 fL (ref 80.0–100.0)
Monocytes Absolute: 0.6 10*3/uL (ref 0.1–1.0)
Monocytes Relative: 7 %
Neutro Abs: 5.8 10*3/uL (ref 1.7–7.7)
Neutrophils Relative %: 65 %
Platelets: 279 10*3/uL (ref 150–400)
RBC: 4.16 MIL/uL (ref 3.87–5.11)
RDW: 13.2 % (ref 11.5–15.5)
WBC: 8.8 10*3/uL (ref 4.0–10.5)
nRBC: 0 % (ref 0.0–0.2)

## 2022-01-07 LAB — CBC
HCT: 33.8 % — ABNORMAL LOW (ref 36.0–46.0)
Hemoglobin: 11.9 g/dL — ABNORMAL LOW (ref 12.0–15.0)
MCH: 32.3 pg (ref 26.0–34.0)
MCHC: 35.2 g/dL (ref 30.0–36.0)
MCV: 91.8 fL (ref 80.0–100.0)
Platelets: 258 10*3/uL (ref 150–400)
RBC: 3.68 MIL/uL — ABNORMAL LOW (ref 3.87–5.11)
RDW: 13.1 % (ref 11.5–15.5)
WBC: 9.3 10*3/uL (ref 4.0–10.5)
nRBC: 0 % (ref 0.0–0.2)

## 2022-01-07 LAB — URINALYSIS, ROUTINE W REFLEX MICROSCOPIC
Bilirubin Urine: NEGATIVE
Glucose, UA: NEGATIVE mg/dL
Hgb urine dipstick: NEGATIVE
Ketones, ur: NEGATIVE mg/dL
Nitrite: NEGATIVE
Protein, ur: NEGATIVE mg/dL
Specific Gravity, Urine: 1.007 (ref 1.005–1.030)
pH: 7 (ref 5.0–8.0)

## 2022-01-07 LAB — SODIUM, URINE, RANDOM: Sodium, Ur: 58 mmol/L

## 2022-01-07 LAB — VITAMIN D 25 HYDROXY (VIT D DEFICIENCY, FRACTURES): Vit D, 25-Hydroxy: 71.32 ng/mL (ref 30–100)

## 2022-01-07 LAB — HEMOGLOBIN A1C
Hgb A1c MFr Bld: 5.3 % (ref 4.8–5.6)
Mean Plasma Glucose: 105.41 mg/dL

## 2022-01-07 LAB — GLUCOSE, CAPILLARY
Glucose-Capillary: 68 mg/dL — ABNORMAL LOW (ref 70–99)
Glucose-Capillary: 99 mg/dL (ref 70–99)

## 2022-01-07 LAB — CREATININE, URINE, RANDOM: Creatinine, Urine: 40 mg/dL

## 2022-01-07 LAB — MRSA NEXT GEN BY PCR, NASAL: MRSA by PCR Next Gen: NOT DETECTED

## 2022-01-07 LAB — MAGNESIUM: Magnesium: 2.6 mg/dL — ABNORMAL HIGH (ref 1.7–2.4)

## 2022-01-07 MED ORDER — LACTATED RINGERS IV BOLUS
1000.0000 mL | Freq: Once | INTRAVENOUS | Status: AC
  Administered 2022-01-07: 1000 mL via INTRAVENOUS

## 2022-01-07 MED ORDER — CALCITONIN (SALMON) 200 UNIT/ML IJ SOLN
4.0000 [IU]/kg | Freq: Two times a day (BID) | INTRAMUSCULAR | Status: AC
  Administered 2022-01-07 – 2022-01-08 (×4): 272 [IU] via SUBCUTANEOUS
  Filled 2022-01-07 (×4): qty 1.36

## 2022-01-07 MED ORDER — HYDROCODONE-ACETAMINOPHEN 5-325 MG PO TABS
1.0000 | ORAL_TABLET | Freq: Four times a day (QID) | ORAL | Status: DC | PRN
  Administered 2022-01-10: 1 via ORAL
  Filled 2022-01-07: qty 1

## 2022-01-07 MED ORDER — HEPARIN SODIUM (PORCINE) 5000 UNIT/ML IJ SOLN
5000.0000 [IU] | Freq: Three times a day (TID) | INTRAMUSCULAR | Status: DC
  Administered 2022-01-07 – 2022-01-11 (×13): 5000 [IU] via SUBCUTANEOUS
  Filled 2022-01-07 (×13): qty 1

## 2022-01-07 MED ORDER — SODIUM CHLORIDE 0.9 % IV SOLN: INTRAVENOUS | Status: DC

## 2022-01-07 MED ORDER — ZOLEDRONIC ACID 4 MG/5ML IV CONC
4.0000 mg | Freq: Once | INTRAVENOUS | Status: AC
  Administered 2022-01-07: 4 mg via INTRAVENOUS
  Filled 2022-01-07: qty 5

## 2022-01-07 MED ORDER — INSULIN ASPART 100 UNIT/ML IJ SOLN: 0.0000 [IU] | Freq: Three times a day (TID) | INTRAMUSCULAR | Status: DC

## 2022-01-07 MED ORDER — HYDROCODONE-ACETAMINOPHEN 5-325 MG PO TABS
1.0000 | ORAL_TABLET | Freq: Once | ORAL | Status: AC
  Administered 2022-01-07: 1 via ORAL
  Filled 2022-01-07: qty 1

## 2022-01-07 MED ORDER — SODIUM CHLORIDE 0.9 % IV BOLUS
1000.0000 mL | Freq: Once | INTRAVENOUS | Status: AC
  Administered 2022-01-07: 1000 mL via INTRAVENOUS

## 2022-01-07 MED ORDER — ONDANSETRON HCL 4 MG PO TABS: 4.0000 mg | ORAL_TABLET | Freq: Four times a day (QID) | ORAL | Status: DC | PRN

## 2022-01-07 MED ORDER — LACTATED RINGERS IV SOLN: INTRAVENOUS | Status: DC

## 2022-01-07 MED ORDER — ONDANSETRON HCL 4 MG/2ML IJ SOLN
4.0000 mg | Freq: Four times a day (QID) | INTRAMUSCULAR | Status: DC | PRN
  Administered 2022-01-08 – 2022-01-09 (×2): 4 mg via INTRAVENOUS
  Filled 2022-01-07 (×2): qty 2

## 2022-01-07 MED ORDER — ACETAMINOPHEN 325 MG PO TABS
650.0000 mg | ORAL_TABLET | Freq: Once | ORAL | Status: DC
  Filled 2022-01-07: qty 2

## 2022-01-07 NOTE — ED Notes (Signed)
We received her medications, and gave same just prior to transfer via [facility].

## 2022-01-07 NOTE — ED Triage Notes (Signed)
Pt arrives via POV. Pt c/o fatigue, generalized weakness, decreased appetite, and  cognitive issues for the past couple of months. Pt had some lab work yesterday and was found to have a Calcium level of 17. Pt denies cp or sob. Pt is AxOx4

## 2022-01-07 NOTE — ED Notes (Signed)
Per [facility], accessed by pt's. Phone: lab work from 11-03-2021: BUN 20, Creatinine 0.8, and Ca+ 9.3.

## 2022-01-07 NOTE — Plan of Care (Signed)
  Problem: Clinical Measurements: Goal: Ability to maintain clinical measurements within normal limits will improve Outcome: Progressing Goal: Diagnostic test results will improve Outcome: Progressing Goal: Respiratory complications will improve Outcome: Progressing Goal: Cardiovascular complication will be avoided Outcome: Progressing   Problem: Activity: Goal: Risk for activity intolerance will decrease Outcome: Progressing   Problem: Pain Managment: Goal: General experience of comfort will improve Outcome: Progressing   Problem: Safety: Goal: Ability to remain free from injury will improve Outcome: Progressing

## 2022-01-07 NOTE — ED Notes (Signed)
Dr. Rhunette Croft explains the plan for admission at some length, and fields questions from pt. And her husband, who has been in constant attendance.

## 2022-01-07 NOTE — ED Provider Notes (Addendum)
[facility] EMERGENCY DEPT Provider Note   CSN: [id number] Arrival date & time: 01/07/22  0747     History  Chief Complaint  Patient presents with   Weakness   Abnormal Lab    Alexa Buchanan is a 71 y.o. female.  HPI    71 year old female comes in with chief complaint of weakness and abnormal lab.  Patient has past medical history of diabetes, primary biliary cirrhosis. She states that she has been feeling unwell for the last 2 or 3 months.  Patient has been having some urologic issues, malaise, nausea, anorexia, difficulty gripping things and back pain.  She was seen by her PCP in September, and things were fine.  She had a repeat appointment yesterday and received a call this morning from the PCP advising her to come to the ER because of elevated calcium.  Patient denies any primary history of cancer.  Husband is at the bedside.  He indicates that patient also has been having some cognitive issues more so recently.  Patient denies night sweats, weight loss.   Home Medications Prior to Admission medications   Medication Sig Start Date End Date Taking? Authorizing Provider  aspirin EC 81 MG tablet Take 81 mg by mouth daily.    [provider]  Calcium Carbonate-Vitamin D (CALCIUM + D PO) Take 1 tablet by mouth 2 (two) times daily.    [provider]  cetirizine (ZYRTEC) 10 MG tablet Take 10 mg by mouth daily.    [provider]  Cholecalciferol (VITAMIN D3) 2000 UNITS TABS Take 2,000 Units by mouth 2 (two) times daily.    [provider]  glucose blood (ONE TOUCH ULTRA TEST) test strip CHECK BLOOD SUGAR TWICE A DAY AS DIRECTED. 05/07/14   Pecola Lawless, MD  HYDROcodone-acetaminophen (NORCO/VICODIN) 5-325 MG tablet Take 1 tablet by mouth every 6 (six) hours as needed. 10/27/20   [provider]  Multiple Vitamin (MULTIVITAMIN) capsule Take 1 capsule by mouth daily. Centrum Silver.    [provider]   oxybutynin (DITROPAN XL) 15 MG 24 hr tablet TAKE 1 TABLET DAILY. 04/04/16   [provider]  Semaglutide,0.25 or 0.5MG /DOS, (OZEMPIC, 0.25 OR 0.5 MG/DOSE,) 2 MG/1.5ML SOPN Inject into the skin.    [provider]  ursodiol (ACTIGALL) 300 MG capsule TAKE (2) CAPSULES BY MOUTH TWICE DAILY. 01/15/19   Sherrilyn Rist, MD  zolpidem (AMBIEN) 10 MG tablet Take 5 mg by mouth as needed for sleep.  09/11/12   [provider]      Allergies    Empagliflozin    Review of Systems   Review of Systems  All other systems reviewed and are negative.   Physical Exam Updated Vital Signs BP (!) 163/62   Pulse 68   Temp 97.8 F (36.6 C) (Oral)   Resp 15   Ht 5\' 4"  (1.626 m)   Wt 68 kg   LMP 02/28/1995   SpO2 97%   BMI 25.75 kg/m  Physical Exam Vitals and nursing note reviewed.  Constitutional:      Appearance: She is well-developed.  HENT:     Head: Atraumatic.  Cardiovascular:     Rate and Rhythm: Normal rate.  Pulmonary:     Effort: Pulmonary effort is normal.  Musculoskeletal:     Cervical back: Normal range of motion and neck supple.  Skin:    General: Skin is warm and dry.  Neurological:     Mental Status: She is alert  and oriented to person, place, and time.     ED Results / Procedures / Treatments   Labs (all labs ordered are listed, but only abnormal results are displayed) Labs Reviewed  COMPREHENSIVE METABOLIC PANEL - Abnormal; Notable for the following components:      Result Value   BUN 40 (*)    Creatinine, Ser 3.48 (*)    Calcium >15.0 (*)    GFR, Estimated 13 (*)    All other components within normal limits  MAGNESIUM - Abnormal; Notable for the following components:   Magnesium 2.6 (*)    All other components within normal limits  URINALYSIS, ROUTINE W REFLEX MICROSCOPIC - Abnormal; Notable for the following components:   Leukocytes,Ua SMALL (*)    Bacteria, UA MANY (*)    All other components within normal limits  CBC WITH  DIFFERENTIAL/PLATELET  CALCIUM, IONIZED  SODIUM, URINE, RANDOM  CREATININE, URINE, RANDOM  PARATHYROID HORMONE, INTACT (NO CA)    EKG EKG Interpretation  Date/Time:  Saturday January 07 2022 08:05:53 EST Ventricular Rate:  69 PR Interval:  135 QRS Duration: 95 QT Interval:  369 QTC Calculation: 396 R Axis:   -42 Text Interpretation: Sinus rhythm Left axis deviation No acute changes No significant change since last tracing Confirmed by Varney Biles [phone]) on 01/07/2022 12:34:47 PM  Radiology No results found.  Procedures .Critical Care  Performed by: Varney Biles, MD Authorized by: Varney Biles, MD   Critical care provider statement:    Critical care time (minutes):  57   Critical care was necessary to treat or prevent imminent or life-threatening deterioration of the following conditions:  Metabolic crisis   Critical care was time spent personally by me on the following activities:  Development of treatment plan with patient or surrogate, discussions with consultants, evaluation of patient's response to treatment, examination of patient, ordering and review of laboratory studies, ordering and review of radiographic studies, ordering and performing treatments and interventions, pulse oximetry, re-evaluation of patient's condition and review of old charts     Medications Ordered in ED Medications  zoledronic acid (ZOMETA) 4 mg in sodium chloride 0.9 % 100 mL IVPB (has no administration in time range)  calcitonin (MIACALCIN) injection 272 Units (has no administration in time range)  lactated ringers infusion ( Intravenous New Bag/Given 01/07/22 1417)  acetaminophen (TYLENOL) tablet 650 mg ( Oral Canceled Entry 01/07/22 1521)  sodium chloride 0.9 % bolus 1,000 mL (0 mLs Intravenous Stopped 01/07/22 1230)  lactated ringers bolus 1,000 mL (0 mLs Intravenous Stopped 01/07/22 1340)  HYDROcodone-acetaminophen (NORCO/VICODIN) 5-325 MG per tablet 1 tablet (1 tablet Oral  Given 01/07/22 1519)    ED Course/ Medical Decision Making/ A&P Clinical Course as of 01/07/22 1529  Sat Jan 07, 2022  1346 Obtained records from clinic. Pt's Cr was normal in September.  [AN]    Clinical Course User Index [AN] Varney Biles, MD                           Medical Decision Making Amount and/or Complexity of Data Reviewed Labs: ordered.  Risk Prescription drug management. Decision regarding hospitalization.    This patient presents to the ED with chief complaint(s) of abnormal lab with pertinent past medical history of primary biliary cirrhosis.The complaint involves an extensive differential diagnosis and also carries with it a high risk of complications and morbidity.    The differential diagnosis includes : Hypercalcemia due to renal disease, parathyroid disease, malignancy.  It appears that all the vague symptoms she has been having, are consistent with hypercalcemia.  Patient has some neuropsych issues, urologic issues, GI issues and musculoskeletal issues, all week.  We ordered repeat labs.  Calcium is over 15.  Patient also noted to have creatinine close to 4.  Unclear what the baseline is for the patient.  Given the hypercalcemia, we will give her 2 L of IV fluid followed by 200 cc/h of normal saline. We will also give her calcitonin and bisphosphonates.  EKG does not show any concerning findings.  Results of the ER work-up and the plan to admit discussed with the patient.   Additional history obtained: Additional history obtained from spouse Records reviewed  previous renal function, metabolic profile  Independent labs interpretation:  The following labs were independently interpreted: Elevated creatinine and elevated calcium noted  Treatment and Reassessment: Patient reassessed.  She is still waiting for procalcitonin and bisphosphonate that will be tube from [facility].    Final Clinical Impression(s) / ED Diagnoses Final diagnoses:   Hypercalcemia  AKI (acute kidney injury) [facility])    Rx / DC Orders ED Discharge Orders     None         Varney Biles, MD 01/07/22 1529

## 2022-01-07 NOTE — ED Notes (Signed)
We still have not received the Zometa and Miacalcin. [facility]; and they assured me that they will contact Dash to locate the medication and re-route it to be given to her at [facility]. They further told me that they used to last vial of Zometa in the system. I informed pt. Of this.

## 2022-01-07 NOTE — ED Notes (Signed)
Bladder scan results: 

## 2022-01-07 NOTE — H&P (Signed)
TRH H&P    Patient Demographics:    Alexa Buchanan, is a 71 y.o. female  MRN: [id number]  DOB - December 25, 1950  Admit Date - 01/07/2022  Referring MD/NP/PA: Derwood Kaplan  Outpatient Primary MD for the patient is Alysia Penna, MD  [facility]  Chief complaint-generalized weakness, confusion   HPI:    Alexa Buchanan  is a 71 y.o. female, with medical history of diabetes mellitus type 2, primary biliary cirrhosis, migraine, diverticulosis, IBS who [facility] after her primary care sent her there for high calcium.  As per patient she has been having symptoms of malaise, nausea, difficulty gripping things, back pain, confusion, constipation since September.  Patient was seen by her PCP on 7 September and a full physical with lab work was performed which was normal as per patient and her husband.  Because of these ongoing symptoms she went to her PCP office last week on Friday, lab work was done which showed severely high calcium. She was asked to come to the ED for further evaluation.  In the ED patient was found to have BUN 40, creatinine 3.48, calcium greater than 15.0, magnesium 2.6. Patient was given 1 dose of zoledronic acid 4 mg IV, started on calcitonin 272 units subcutaneous twice a day for 4 doses.  She denies chest pain or shortness of breath Denies abdominal pain or dysuria Has been having episodes of confusion  She was recently seen by orthopedic surgeon for back pain, MRI of lumbar spine showed disc bulge and advanced left worse than right facet arthropathy at L4-5 resulting in mild to moderate spinal canal stenosis with left worse than right subarticular zone narrowing and moderate left and mild right neuroforaminal stenosis.  Also showed L5-S1 right facet arthropathy.    Review of systems:    In addition to the HPI above,   All other systems reviewed and  are negative.    Past History of the following :    Past Medical History:  Diagnosis Date   Arthritis    Complication of anesthesia    woke up to soon.  reported "bad gag reflex"      Diabetes mellitus    Type 2   Diverticulosis    Dysmetabolic syndrome X    Histoplasmosis 1971   IBS (irritable bowel syndrome)    Liver disease    PBC   Migraines    ice pick headaches, Dr. Marylou Flesher   OSA (obstructive sleep apnea) 07/27/2011   Home sleep study 08/18/11 >> AHI 8.1, SpO2 low 74%.  pt stated she does not have sleep apnes   Seasonal allergies    UTI symptoms       Past Surgical History:  Procedure Laterality Date   BREAST EXCISIONAL BIOPSY Right    BREAST LUMPECTOMY WITH RADIOACTIVE SEED LOCALIZATION Right 07/22/2015   Procedure: BREAST LUMPECTOMY WITH RADIOACTIVE SEED LOCALIZATION;  Surgeon: Abigail Miyamoto, MD;  Location: [facility] OR;  Service: General;  Laterality: Right;   COLONOSCOPY     Tics; Dr  Brodie   DILATION AND CURETTAGE OF UTERUS     Gastric Plication  1981   KNEE ARTHROSCOPY Left 1990   LASER ABLATION OF VASCULAR LESION  2009   Dr. Dorma Russell, wart removed from nose   RECTOCELE REPAIR  2003   with vaginal wall repair and sling   ROTATOR CUFF REPAIR Right 2016   TONSILLECTOMY     1982/1992   VAGINAL HYSTERECTOMY  2003   Vag Hyst,BSO,Sling, posterior repair   VULVA SURGERY     keratoses   WRIST SURGERY Left 1988    tendon repair      Social History:      Social History   Tobacco Use   Smoking status: Every Day    Packs/day: 0.50    Years: 30.00    Total pack years: 15.00    Types: Cigarettes   Smokeless tobacco: Never   Tobacco comments:    Smoked 1968-present; up to 1 ppd  Substance Use Topics   Alcohol use: No       Family History :     Family History  Problem Relation Age of Onset   Leukemia Father        CLL   Hyperlipidemia Mother    Multiple sclerosis Mother    Hypertension Mother    Diabetes Maternal Grandmother        also had  malignant lower neck mass of unknown etiology   Breast cancer Sister        age 37   Hypertension Sister        related to migraines   Migraines Sister    Stroke Maternal Grandfather 59   Heart disease Maternal Aunt        porcine valve   Heart disease Maternal Uncle        valvular disease   Colon cancer Neg Hx    Stomach cancer Neg Hx    Pancreatic cancer Neg Hx    Esophageal cancer Neg Hx       Home Medications:   Prior to Admission medications   Medication Sig Start Date End Date Taking? Authorizing Provider  aspirin EC 81 MG tablet Take 81 mg by mouth daily.    [provider]  Calcium Carbonate-Vitamin D (CALCIUM + D PO) Take 1 tablet by mouth 2 (two) times daily.    [provider]  cetirizine (ZYRTEC) 10 MG tablet Take 10 mg by mouth daily.    [provider]  Cholecalciferol (VITAMIN D3) 2000 UNITS TABS Take 2,000 Units by mouth 2 (two) times daily.    [provider]  glucose blood (ONE TOUCH ULTRA TEST) test strip CHECK BLOOD SUGAR TWICE A DAY AS DIRECTED. 05/07/14   Pecola Lawless, MD  HYDROcodone-acetaminophen (NORCO/VICODIN) 5-325 MG tablet Take 1 tablet by mouth every 6 (six) hours as needed. 10/27/20   [provider]  Multiple Vitamin (MULTIVITAMIN) capsule Take 1 capsule by mouth daily. Centrum Silver.    [provider]  oxybutynin (DITROPAN XL) 15 MG 24 hr tablet TAKE 1 TABLET DAILY. 04/04/16   [provider]  Semaglutide,0.25 or 0.5MG /DOS, (OZEMPIC, 0.25 OR 0.5 MG/DOSE,) 2 MG/1.5ML SOPN Inject into the skin.    [provider]  ursodiol (ACTIGALL) 300 MG capsule TAKE (2) CAPSULES BY MOUTH TWICE DAILY. 01/15/19   Sherrilyn Rist, MD  zolpidem (AMBIEN) 10 MG tablet Take 5 mg by mouth as needed for sleep.  09/11/12   [provider]  Allergies:     Allergies  Allergen Reactions   Empagliflozin     Other reaction(s): Other (See Comments), recurrent vaginal yeast  infections Yeast infection      Physical Exam:   Vitals  Blood pressure (!) 146/84, pulse 64, temperature 97.6 F (36.4 C), temperature source Oral, resp. rate 14, height 5\' 4"  (1.626 m), weight 68 kg, last menstrual period 02/28/1995, SpO2 92 %.  1.  General: Appears dehydrated with dry lips  2. Psychiatric: Alert, oriented x3, intact insight and judgment  3. Neurologic: Cranial nerves II through XII grossly intact, no focal deficit noted, moving all extremities  4. HEENMT:  Atraumatic normocephalic, extraocular muscles are intact  5. Respiratory : Clear to auscultation bilaterally  6. Cardiovascular : S1-S2, regular, no murmur auscultated  7. Gastrointestinal:  Abdomen is soft, nontender, no organomegaly  8. Skin:  No rashes noted      Data Review:    CBC Recent Labs  Lab 01/07/22 0829  WBC 8.8  HGB 13.1  HCT 38.6  PLT 279  MCV 92.8  MCH 31.5  MCHC 33.9  RDW 13.2  LYMPHSABS 2.1  MONOABS 0.6  EOSABS 0.2  BASOSABS 0.1   ------------------------------------------------------------------------------------------------------------------  Results for orders placed or performed during the hospital encounter of 01/07/22 (from the past 48 hour(s))  CBC with Differential     Status: None   Collection Time: 01/07/22  8:29 AM  Result Value Ref Range   WBC 8.8 4.0 - 10.5 K/uL   RBC 4.16 3.87 - 5.11 MIL/uL   Hemoglobin 13.1 12.0 - 15.0 g/dL   HCT 13/11/23 40.9 - 81.1 %   MCV 92.8 80.0 - 100.0 fL   MCH 31.5 26.0 - 34.0 pg   MCHC 33.9 30.0 - 36.0 g/dL   RDW 91.4 78.2 - 95.6 %   Platelets 279 150 - 400 K/uL   nRBC 0.0 0.0 - 0.2 %   Neutrophils Relative % 65 %   Neutro Abs 5.8 1.7 - 7.7 K/uL   Lymphocytes Relative 24 %   Lymphs Abs 2.1 0.7 - 4.0 K/uL   Monocytes Relative 7 %   Monocytes Absolute 0.6 0.1 - 1.0 K/uL   Eosinophils Relative 3 %   Eosinophils Absolute 0.2 0.0 - 0.5 K/uL   Basophils Relative 1 %   Basophils Absolute 0.1 0.0 - 0.1 K/uL    Immature Granulocytes 0 %   Abs Immature Granulocytes 0.02 0.00 - 0.07 K/uL    Comment: Performed at [facility], [address], [place], [place]  Comprehensive metabolic panel     Status: Abnormal   Collection Time: 01/07/22  8:29 AM  Result Value Ref Range   Sodium 138 135 - 145 mmol/L   Potassium 4.0 3.5 - 5.1 mmol/L   Chloride 99 98 - 111 mmol/L   CO2 31 22 - 32 mmol/L   Glucose, Bld 94 70 - 99 mg/dL    Comment: Glucose reference range applies only to samples taken after fasting for at least 8 hours.   BUN 40 (H) 8 - 23 mg/dL   Creatinine, Ser 13/11/23 (H) 0.44 - 1.00 mg/dL   Calcium 8.46 (HH) 8.9 - 10.3 mg/dL    Comment: CRITICAL RESULT CALLED TO, READ BACK BY AND VERIFIED WITH: I,AVON AT 0909 ON 01/07/22 BY A,MOHAMED    Total Protein 6.6 6.5 - 8.1 g/dL   Albumin 3.9 3.5 - 5.0 g/dL   AST 16 15 - 41 U/L   ALT 15 0 -  44 U/L   Alkaline Phosphatase 79 38 - 126 U/L   Total Bilirubin 0.6 0.3 - 1.2 mg/dL   GFR, Estimated 13 (L) >60 mL/min    Comment: (NOTE) Calculated using the CKD-EPI Creatinine Equation (2021)    Anion gap 8 5 - 15    Comment: Performed at [facility], [address] [place], [place] [address]  Magnesium     Status: Abnormal   Collection Time: 01/07/22  8:29 AM  Result Value Ref Range   Magnesium 2.6 (H) 1.7 - 2.4 mg/dL    Comment: Performed at [facility], [address] [place], [place] [address]  Sodium, urine, random     Status: None   Collection Time: 01/07/22  2:10 PM  Result Value Ref Range   Sodium, Ur 58 mmol/L    Comment: Performed at [facility], [address], [place], [place] [address]  Creatinine, urine, random     Status: None   Collection Time: 01/07/22  2:10 PM  Result Value Ref Range   Creatinine, Urine 40 mg/dL    Comment: Performed at [facility], [address], [place], [place] [address]  Urinalysis, Routine w reflex microscopic Urine, Clean Catch      Status: Abnormal   Collection Time: 01/07/22  2:10 PM  Result Value Ref Range   Color, Urine YELLOW YELLOW   APPearance CLEAR CLEAR   Specific Gravity, Urine 1.007 1.005 - 1.030   pH 7.0 5.0 - 8.0   Glucose, UA NEGATIVE NEGATIVE mg/dL   Hgb urine dipstick NEGATIVE NEGATIVE   Bilirubin Urine NEGATIVE NEGATIVE   Ketones, ur NEGATIVE NEGATIVE mg/dL   Protein, ur NEGATIVE NEGATIVE mg/dL   Nitrite NEGATIVE NEGATIVE   Leukocytes,Ua SMALL (A) NEGATIVE   RBC / HPF 0-5 0 - 5 RBC/hpf   WBC, UA 11-20 0 - 5 WBC/hpf   Bacteria, UA MANY (A) NONE SEEN   Squamous Epithelial / LPF 0-5 0 - 5   Mucus PRESENT    Hyaline Casts, UA PRESENT     Comment: Performed at [facility], [address], [place], [place] [address]    Chemistries  Recent Labs  Lab 01/07/22 0829  NA 138  K 4.0  CL 99  CO2 31  GLUCOSE 94  BUN 40*  CREATININE 3.48*  CALCIUM >15.0*  MG 2.6*  AST 16  ALT 15  ALKPHOS 79  BILITOT 0.6   ------------------------------------------------------------------------------------------------------------------  ------------------------------------------------------------------------------------------------------------------ GFR: Estimated Creatinine Clearance: 13.6 mL/min (A) (by C-G formula based on SCr of 3.48 mg/dL (H)). Liver Function Tests: Recent Labs  Lab 01/07/22 0829  AST 16  ALT 15  ALKPHOS 79  BILITOT 0.6  PROT 6.6  ALBUMIN 3.9   No results for input(s): "LIPASE", "AMYLASE" in the last 168 hours. No results for input(s): "AMMONIA" in the last 168 hours. Coagulation Profile: No results for input(s): "INR", "PROTIME" in the last 168 hours. Cardiac Enzymes: No results for input(s): "CKTOTAL", "CKMB", "CKMBINDEX", "TROPONINI" in the last 168 hours. BNP (last 3 results) No results for input(s): "PROBNP" in the last 8760 hours. HbA1C: No results for input(s): "HGBA1C" in the last 72 hours. CBG: No results for input(s): "GLUCAP" in the  last 168 hours. Lipid Profile: No results for input(s): "CHOL", "HDL", "LDLCALC", "TRIG", "CHOLHDL", "LDLDIRECT" in the last 72 hours. Thyroid Function Tests: No results for input(s): "TSH", "T4TOTAL", "FREET4", "T3FREE", "THYROIDAB" in the last 72 hours. Anemia Panel: No results for input(s): "VITAMINB12", "FOLATE", "FERRITIN", "TIBC", "  IRON", "RETICCTPCT" in the last 72 hours.  --------------------------------------------------------------------------------------------------------------- Urine analysis:    Component Value Date/Time   COLORURINE YELLOW 01/07/2022 1410   APPEARANCEUR CLEAR 01/07/2022 1410   LABSPEC 1.007 01/07/2022 1410   PHURINE 7.0 01/07/2022 1410   GLUCOSEU NEGATIVE 01/07/2022 1410   HGBUR NEGATIVE 01/07/2022 1410   HGBUR large 04/21/2010 1144   BILIRUBINUR NEGATIVE 01/07/2022 1410   BILIRUBINUR small 12/29/2010 1553   KETONESUR NEGATIVE 01/07/2022 1410   PROTEINUR NEGATIVE 01/07/2022 1410   UROBILINOGEN 0.2 10/16/2013 0955   NITRITE NEGATIVE 01/07/2022 1410   LEUKOCYTESUR SMALL (A) 01/07/2022 1410      Imaging Results:    No results found.  My personal review of EKG: Rhythm NSR, left axis deviation, QTc 396 ,no Acute ST changes   Assessment & Plan:    Principal Problem:   Hypercalcemia   Severe hypercalcemia-unclear etiology, patient takes vitamin D 2000 units twice a day and calcium plus vitamin D supplements; which patient has been taking for many years.  PTH has been obtained.  We will check vitamin D level, 25 OH, to check for elevated vitamin D level.  I doubt patient has malignancy, as she had normal calcium level in September.  CT chest obtained in October 2023 was unremarkable for malignancy.  If PTH and vitamin D level come back normal then will do malignancy work-up including PTH RP,  OH UPEP, SPEP to rule out underlying malignancy.  We will also need to check vitamin D 1, 25 di OH  to  rule out granulomatous disease.  She already received  Zometa in the ED, will continue with calcitonin  272 units subcu twice daily for 4 doses, she has been getting LR at 200 mL/h and also received 2 L of fluid in the ED.  Will change IV fluids normal saline at 150 mL/h.  We will repeat labs in a.m.  We will also check serum calcium and BUN/creatinine level at this time.  Diabetes mellitus type 2-we will start sliding scale insulin with NovoLog, check hemoglobin A1c.  Acute kidney injury-likely in the setting of hypercalcemia with poor p.o. intake.  Started on IV fluids as above.  Follow BMP in am.   DVT Prophylaxis-   heparin  AM Labs Ordered, also please review Full Orders  Family Communication: Admission, patients condition and plan of care including tests being ordered have been discussed with the patient and her husband at bedside who indicate understanding and agree with the plan and Code Status.  Code Status: Full code  Admission status: Inpatient :The appropriate admission status for this patient is INPATIENT. Inpatient status is judged to be reasonable and necessary in order to provide the required intensity of service to ensure the patient's safety. The patient's presenting symptoms, physical exam findings, and initial radiographic and laboratory data in the context of their chronic comorbidities is felt to place them at high risk for further clinical deterioration. Furthermore, it is not anticipated that the patient will be medically stable for discharge from the hospital within 2 midnights of admission. The following factors support the admission status of inpatient.         * I certify that at the point of admission it is my clinical judgment that the patient will require inpatient hospital care spanning beyond 2 midnights from the point of admission due to high intensity of service, high risk for further deterioration and high frequency of surveillance required.*  Time spent in minutes : 60 minutes   Valerian Jewel S  Roran Wegner M.D

## 2022-01-07 NOTE — Progress Notes (Signed)
Hypoglycemic Event  CBG: 68  Treatment: 8 oz juice/soda and pudding   Symptoms: None  Follow-up CBG: Time: 2214 CBG Result:99  Possible Reasons for Event: Inadequate meal intake  Comments/MD notified: protocol followed    Kary Kos

## 2022-01-07 NOTE — ED Notes (Addendum)
CRITICAL VALUE STICKER  CRITICAL VALUE:Calcium 15.2  RECEIVER (on-site recipient of call):I. Kileigh Ortmann  DATE & TIME NOTIFIED: 01/07/22 0910  MESSENGER (representative from lab):Lab  MD NOTIFIED: Rhunette Croft  TIME OF NOTIFICATION:0913  RESPONSE:

## 2022-01-08 DIAGNOSIS — N179 Acute kidney failure, unspecified: Secondary | ICD-10-CM | POA: Diagnosis not present

## 2022-01-08 LAB — COMPREHENSIVE METABOLIC PANEL
ALT: 15 U/L (ref 0–44)
AST: 20 U/L (ref 15–41)
Albumin: 2.9 g/dL — ABNORMAL LOW (ref 3.5–5.0)
Alkaline Phosphatase: 63 U/L (ref 38–126)
Anion gap: 5 (ref 5–15)
BUN: 33 mg/dL — ABNORMAL HIGH (ref 8–23)
CO2: 28 mmol/L (ref 22–32)
Calcium: 12.3 mg/dL — ABNORMAL HIGH (ref 8.9–10.3)
Chloride: 106 mmol/L (ref 98–111)
Creatinine, Ser: 3.14 mg/dL — ABNORMAL HIGH (ref 0.44–1.00)
GFR, Estimated: 15 mL/min — ABNORMAL LOW (ref >60)
Glucose, Bld: 89 mg/dL (ref 70–99)
Potassium: 3.7 mmol/L (ref 3.5–5.1)
Sodium: 139 mmol/L (ref 135–145)
Total Bilirubin: 0.4 mg/dL (ref 0.3–1.2)
Total Protein: 5 g/dL — ABNORMAL LOW (ref 6.5–8.1)

## 2022-01-08 LAB — GLUCOSE, CAPILLARY
Glucose-Capillary: 90 mg/dL (ref 70–99)
Glucose-Capillary: 85 mg/dL (ref 70–99)
Glucose-Capillary: 102 mg/dL — ABNORMAL HIGH (ref 70–99)
Glucose-Capillary: 83 mg/dL (ref 70–99)

## 2022-01-08 LAB — CBC
HCT: 31.7 % — ABNORMAL LOW (ref 36.0–46.0)
Hemoglobin: 11.2 g/dL — ABNORMAL LOW (ref 12.0–15.0)
MCH: 32.5 pg (ref 26.0–34.0)
MCHC: 35.3 g/dL (ref 30.0–36.0)
MCV: 91.9 fL (ref 80.0–100.0)
Platelets: 229 10*3/uL (ref 150–400)
RBC: 3.45 MIL/uL — ABNORMAL LOW (ref 3.87–5.11)
RDW: 13.1 % (ref 11.5–15.5)
WBC: 9.4 10*3/uL (ref 4.0–10.5)
nRBC: 0 % (ref 0.0–0.2)

## 2022-01-08 MED ORDER — AMLODIPINE BESYLATE 5 MG PO TABS
5.0000 mg | ORAL_TABLET | Freq: Every day | ORAL | Status: DC
  Administered 2022-01-08 – 2022-01-12 (×5): 5 mg via ORAL
  Filled 2022-01-08 (×5): qty 1

## 2022-01-08 NOTE — Progress Notes (Signed)
[facility]  PROGRESS NOTE  Alexa Buchanan DHR:[id number] DOB: [birth year] DOA: 01/07/2022 PCP: Alysia Penna, MD   Brief HPI:   71 y.o. female, with medical history of diabetes mellitus type 2, primary biliary cirrhosis, migraine, diverticulosis, IBS who came to the med Center [facility] after her primary care sent her there for high calcium.  As per patient she has been having symptoms of malaise, nausea, difficulty gripping things, back pain, confusion, constipation since September.  Patient was seen by her PCP on 7 September and a full physical with lab work was performed which was normal as per patient and her husband.  Because of these ongoing symptoms she went to her PCP office last week on Friday, lab work was done which showed severely high calcium. She was asked to come to the ED for further evaluation.   In the ED patient was found to have BUN 40, creatinine 3.48, calcium greater than 15.0, magnesium 2.6. Patient was given 1 dose of zoledronic acid 4 mg IV, started on calcitonin 272 units subcutaneous twice a day for 4 doses.      Subjective   Patient seen and examined, denies shortness of breath.  Vitamin D 25 OH level came back at 71.32.  Serum corrected calcium was down to 13.2   Assessment/Plan:     Severe hypercalcemia -Presented with calcium greater than 15.0 -Unclear etiology; PTH is pending, vitamin D 25 OH level 71.3  -Patient started on normal saline 150 mill per hour, also received 1 dose of Zometa, calcitonin 272 units twice daily for 4 doses -Corrected calcium was down to 13.2 -We will continue with aggressive hydration with normal saline at 150 mill per hour -We will check SPEP, UPEP  Acute kidney injury -Serum creatinine is down to 3.14 -She is making urine, this is nonoliguric AKI -FeNa is 3.6% consistent with intrinsic ATN - continue IVF as above   Diabetes mellitus type 2 -Previous hemoglobin A1c from 5/17 was 7.4 -Started on sliding scale  insulin NovoLog, -CBG was low last night -Started on carb modified diet -Hemoglobin A1c is 5.3  Hypertension -Blood pressure is mildly elevated -Start amlodipine 5 mg daily  Medications     calcitonin  4 Units/kg Subcutaneous BID   heparin  5,000 Units Subcutaneous Q8H   insulin aspart  0-9 Units Subcutaneous TID WC     Data Reviewed:   CBG:  Recent Labs  Lab 01/07/22 2114 01/07/22 2214 01/08/22 0630  GLUCAP 68* 99 90    SpO2: 94 %    Vitals:   01/07/22 1923 01/07/22 2304 01/08/22 0338 01/08/22 0726  BP: (!) 143/56 (!) 143/49 (!) 148/58 (!) 151/97  Pulse:  (!) 59 69 71  Resp: 14 16 14 15   Temp:  97.6 F (36.4 C) 98.1 F (36.7 C) 98.6 F (37 C)  TempSrc:  Oral Oral Oral  SpO2:  91% 94% 94%  Weight:      Height:          Data Reviewed:  Basic Metabolic Panel: Recent Labs  Lab 01/07/22 0829 01/07/22 1855 01/08/22 0010  NA 138 141 139  K 4.0 4.0 3.7  CL 99 103 106  CO2 31 24 28   GLUCOSE 94 74 89  BUN 40* 35* 33*  CREATININE 3.48* 3.25* 3.14*  CALCIUM >15.0* 13.0* 12.3*  MG 2.6*  --   --     CBC: Recent Labs  Lab 01/07/22 0829 01/07/22 1855 01/08/22 0010  WBC 8.8 9.3 9.4  NEUTROABS 5.8  --   --  HGB 13.1 11.9* 11.2*  HCT 38.6 33.8* 31.7*  MCV 92.8 91.8 91.9  PLT 279 258 229    LFT Recent Labs  Lab 01/07/22 0829 01/07/22 1855 01/08/22 0010  AST 16 23 20   ALT 15 16 15   ALKPHOS 79 60 63  BILITOT 0.6 0.6 0.4  PROT 6.6 5.5* 5.0*  ALBUMIN 3.9 3.1* 2.9*     Antibiotics: Anti-infectives (From admission, onward)    None        DVT prophylaxis: Heparin  Code Status: Full code  Family Communication: No family at bedside   CONSULTS    Objective    Physical Examination:   General: Appears in no acute distress, dried lips Cardiovascular: S1-S2, regular Respiratory: Clear to auscultation bilaterally Abdomen: Soft, nontender, no organomegaly Extremities: No edema in the lower extremities Neurologic: Alert,  oriented x3, intact insight and judgment   Status is: Inpatient:               [facility] If 7PM-7AM, please contact night-coverage at [email or web address], Office  [phone]   01/08/2022, 8:44 AM  LOS: 1 day

## 2022-01-09 DIAGNOSIS — N179 Acute kidney failure, unspecified: Secondary | ICD-10-CM | POA: Insufficient documentation

## 2022-01-09 LAB — COMPREHENSIVE METABOLIC PANEL
ALT: 18 U/L (ref 0–44)
AST: 25 U/L (ref 15–41)
Albumin: 2.8 g/dL — ABNORMAL LOW (ref 3.5–5.0)
Alkaline Phosphatase: 57 U/L (ref 38–126)
Anion gap: 7 (ref 5–15)
BUN: 23 mg/dL (ref 8–23)
CO2: 20 mmol/L — ABNORMAL LOW (ref 22–32)
Calcium: 10 mg/dL (ref 8.9–10.3)
Chloride: 113 mmol/L — ABNORMAL HIGH (ref 98–111)
Creatinine, Ser: 2.71 mg/dL — ABNORMAL HIGH (ref 0.44–1.00)
GFR, Estimated: 18 mL/min — ABNORMAL LOW (ref >60)
Glucose, Bld: 95 mg/dL (ref 70–99)
Potassium: 3.5 mmol/L (ref 3.5–5.1)
Sodium: 140 mmol/L (ref 135–145)
Total Bilirubin: 0.3 mg/dL (ref 0.3–1.2)
Total Protein: 5.1 g/dL — ABNORMAL LOW (ref 6.5–8.1)

## 2022-01-09 LAB — CBC
HCT: 32.5 % — ABNORMAL LOW (ref 36.0–46.0)
Hemoglobin: 10.7 g/dL — ABNORMAL LOW (ref 12.0–15.0)
MCH: 31.4 pg (ref 26.0–34.0)
MCHC: 32.9 g/dL (ref 30.0–36.0)
MCV: 95.3 fL (ref 80.0–100.0)
Platelets: 190 10*3/uL (ref 150–400)
RBC: 3.41 MIL/uL — ABNORMAL LOW (ref 3.87–5.11)
RDW: 13.2 % (ref 11.5–15.5)
WBC: 7.1 10*3/uL (ref 4.0–10.5)
nRBC: 0 % (ref 0.0–0.2)

## 2022-01-09 LAB — GLUCOSE, CAPILLARY
Glucose-Capillary: 103 mg/dL — ABNORMAL HIGH (ref 70–99)
Glucose-Capillary: 92 mg/dL (ref 70–99)
Glucose-Capillary: 89 mg/dL (ref 70–99)
Glucose-Capillary: 89 mg/dL (ref 70–99)

## 2022-01-09 LAB — TSH: TSH: 2.082 u[IU]/mL (ref 0.350–4.500)

## 2022-01-09 LAB — VITAMIN D 25 HYDROXY (VIT D DEFICIENCY, FRACTURES): Vit D, 25-Hydroxy: 63.85 ng/mL (ref 30–100)

## 2022-01-09 MED ORDER — SODIUM CHLORIDE 0.45 % IV SOLN: INTRAVENOUS | Status: AC

## 2022-01-09 MED ORDER — ZOLPIDEM TARTRATE 5 MG PO TABS
5.0000 mg | ORAL_TABLET | Freq: Every evening | ORAL | Status: DC | PRN
  Administered 2022-01-09 – 2022-01-11 (×3): 5 mg via ORAL
  Filled 2022-01-09 (×3): qty 1

## 2022-01-09 MED ORDER — SODIUM CHLORIDE 0.45 % IV SOLN: INTRAVENOUS | Status: DC

## 2022-01-09 NOTE — TOC Progression Note (Signed)
Transition of Care [facility]) - Progression Note    Patient Details  Name: LYDIAH PONG MRN: [id number] Date of Birth: [birth year]  Transition of [facility]) CM/SW Contact  Leone Haven, RN Phone Number: 01/09/2022, 4:45 PM  Clinical Narrative:    NCM spoke with patient and spouse at the bedside, offered choice for HHPT, HHOT, they chose [facility], they did not have a preference for DME.  NCM made referral to [facility] with [name], he is able to take referral, soc will begin 24 to 48 hrs post dc.  NCM made referral to [employer] for rollator.  Spouse will transport home at dc.   Expected Discharge Plan: Home w Home Health Services Barriers to Discharge: Continued Medical Work up  Expected Discharge Plan and Services Expected Discharge Plan: Home w Home Health Services In-house Referral: NA Discharge Planning Services: CM Consult Post Acute Care Choice: Home Health Living arrangements for the past 2 months: Single Family Home                 DME Arranged: Walker rolling with seat DME Agency: [employer] Date DME Agency Contacted: 01/09/22 Time DME Agency Contacted: [phone] Representative spoke with at DME Agency: [employer] Rep HH Arranged: PT, OT HH Agency: [facility] Date [facility] Agency Contacted: 01/09/22 Time HH Agency Contacted: [phone] Representative spoke with at [facility] Agency: [name]   Social Determinants of Health (SDOH) Interventions    Readmission Risk Interventions     No data to display

## 2022-01-09 NOTE — Progress Notes (Signed)
Mobility Specialist Progress Note    01/09/22 1103  Mobility  Activity Ambulated with assistance in hallway  Level of Assistance Contact guard assist, steadying assist  Assistive Device Front wheel walker  Distance Ambulated (ft) 180 ft  Activity Response Tolerated well  Mobility Referral Yes  $Mobility charge 1 Mobility   Pre-Mobility: 68 HR During Mobility: 86 HR Post-Mobility: 71 HR  Pt received in bed and agreeable. No complaints on walk. Returned to chair with call bell in reach.    [place] Mobility Specialist  Please Neurosurgeon or Rehab Office at [phone]

## 2022-01-09 NOTE — Progress Notes (Signed)
Mobility Specialist Progress Note    01/09/22 1539  Mobility  Activity Ambulated with assistance in hallway  Level of Assistance Standby assist, set-up cues, supervision of patient - no hands on  Assistive Device Four wheel walker  Distance Ambulated (ft) 320 ft  Activity Response Tolerated well  Mobility Referral Yes  $Mobility charge 1 Mobility   Pre-Mobility: 72 HR During Mobility: 87 HR Post-Mobility: 75 HR  Pt received in bed and agreeable. No complaints on walk. Returned to bed with call bell in reach.    Alexa Buchanan Mobility Specialist  Please Neurosurgeon or Rehab Office at [phone]

## 2022-01-09 NOTE — Plan of Care (Signed)

## 2022-01-09 NOTE — Evaluation (Signed)
Physical Therapy Evaluation Patient Details Name: Alexa Buchanan MRN: [id number] DOB: 1950/04/19 Today's Date: 01/09/2022  History of Present Illness  Pt is a 71 y/o F presenting to ED on 01/07/22 after increased Calcium levels (17) found after lab work at PCP. Admitted for hypercalcemia and AKI. PMH includes DM2, primary biliary cirrhosis, IBS, arthritis, back pain   Clinical Impression  Pt presents with an overall decrease in functional mobility secondary to above. PTA, pt was mod independent for ambulation with cane and external support. Experienced several falls in last one month and spouse has been providing assist for IADLs and some self care tasks. Today, pt able to ambulate 179ft min guard with RW, no LOB observed but gait was slow and cautious. Mobility possibly limited due to chronic back pain, difficult to establish baseline back pain vs prolonged static bed position back pain. Pt would benefit from continued acute PT services to maximize functional mobility and independence prior to d/c to HHPT.        Recommendations for follow up therapy are one component of a multi-disciplinary discharge planning process, led by the attending physician.  Recommendations may be updated based on patient status, additional functional criteria and insurance authorization.  Follow Up Recommendations Home health PT      Assistance Recommended at Discharge Set up Supervision/Assistance  Patient can return home with the following  A little help with walking and/or transfers;A little help with bathing/dressing/bathroom;Assist for transportation;Help with stairs or ramp for entrance;Assistance with cooking/housework    Equipment Recommendations Other (comment) (TBD - rollator vs 2WRW)  Recommendations for Other Services       Functional Status Assessment Patient has had a recent decline in their functional status and demonstrates the ability to make significant improvements in function in a  reasonable and predictable amount of time.     Precautions / Restrictions Precautions Precautions: Fall;Other (comment) Precaution Comments: x2-3 falls in the past month Restrictions Weight Bearing Restrictions: No      Mobility  Bed Mobility Overal bed mobility: Needs Assistance Bed Mobility: Supine to Sit, Sit to Supine     Supine to sit: Modified independent (Device/Increase time) Sit to supine: Modified independent (Device/Increase time)   General bed mobility comments: increased time and use of 1 handrail; pt plans to buy a handrail for home bed to improve ease of mobility in bed    Transfers Overall transfer level: Needs assistance Equipment used: Rolling walker (2 wheels) Transfers: Sit to/from Stand Sit to Stand: Min guard           General transfer comment: cues necessary for appopriate hand placement to prevent posterior LOB, pt saying "I feel so much weaker"    Ambulation/Gait Ambulation/Gait assistance: Min guard Gait Distance (Feet): 160 Feet Assistive device: Rolling walker (2 wheels) Gait Pattern/deviations: Step-to pattern, Decreased stride length, Narrow base of support Gait velocity: decreased     General Gait Details: demonstrates safe negotiation of RW in room and hallway; slow cautious gait with no LOB  Stairs            Wheelchair Mobility    Modified Rankin (Stroke Patients Only)       Balance Overall balance assessment: Needs assistance Sitting-balance support: No upper extremity supported Sitting balance-Leahy Scale: Good Sitting balance - Comments: stable during static and dynamic sitting balance   Standing balance support: Single extremity supported, Bilateral upper extremity supported, During functional activity Standing balance-Leahy Scale: Poor Standing balance comment: intermittent removal of 1 hand from RW with  no LOB; able to dynamically weight shift during clothing mgmt and open/close of curtain                              Pertinent Vitals/Pain Pain Assessment Faces Pain Scale: Hurts little more Pain Location: chronic back pain Pain Descriptors / Indicators: Discomfort, Aching Pain Intervention(s): Monitored during session, Repositioned    Home Living Family/patient expects to be discharged to:: Private residence Living Arrangements: Spouse/significant other Available Help at Discharge: Family;Available 24 hours/day Type of Home: House Home Access: Stairs to enter Entrance Stairs-Rails: Can reach both Entrance Stairs-Number of Steps: 3 Alternate Level Stairs-Number of Steps: flight Home Layout: Two level;Able to live on main level with bedroom/bathroom Home Equipment: Grab bars - tub/shower;Cane - single point Additional Comments: Gilmer Mor that pt has is unadjustable and for somebody who id 6'2    Prior Function Prior Level of Function : Needs assist             Mobility Comments: used cane for mobility, and external support from furniture or husband with community and household ambulation; reports prior falls x2 within the last month; 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 ADLs Comments: spouse assists since pt has fallen     Hand Dominance   Dominant Hand: Left    Extremity/Trunk Assessment   Upper Extremity Assessment Upper Extremity Assessment: Generalized weakness (functional grip of RW)    Lower Extremity Assessment Lower Extremity Assessment: Generalized weakness (functional AROM of LEs)    Cervical / Trunk Assessment Cervical / Trunk Assessment: Normal  Communication   Communication: No difficulties  Cognition Arousal/Alertness: Awake/alert Behavior During Therapy: WFL for tasks assessed/performed Overall Cognitive Status: Within Functional Limits for tasks assessed                                 General Comments: Pt AOx4, able to dual task while ambulating, carryover of instructions throughout  session >75% of the time        General Comments General comments (skin integrity, edema, etc.): VSS on RA; husband Theodoro Grist) very inquisitive and supportive    Exercises     Assessment/Plan    PT Assessment Patient needs continued PT services  PT Problem List Decreased strength;Decreased range of motion;Decreased activity tolerance;Decreased balance;Decreased mobility;Decreased safety awareness;Decreased knowledge of use of DME       PT Treatment Interventions DME instruction;Balance training;Gait training;Stair training;Functional mobility training;Therapeutic activities;Therapeutic exercise;Patient/family education;Neuromuscular re-education    PT Goals (Current goals can be found in the Care Plan section)  Acute Rehab PT Goals Patient Stated Goal: to feel better soon PT Goal Formulation: With patient Time For Goal Achievement: 01/23/22 Potential to Achieve Goals: Good    Frequency Min 3X/week     Co-evaluation               AM-PAC PT "6 Clicks" Mobility  Outcome Measure Help needed turning from your back to your side while in a flat bed without using bedrails?: None Help needed moving from lying on your back to sitting on the side of a flat bed without using bedrails?: A Little Help needed moving to and from a bed to a chair (including a wheelchair)?: A Little Help needed standing up from a chair using your arms (e.g., wheelchair or bedside chair)?: A Little Help needed to walk  in hospital room?: A Little Help needed climbing 3-5 steps with a railing? : A Little 6 Click Score: 19    End of Session Equipment Utilized During Treatment: Gait belt Activity Tolerance: Patient tolerated treatment well Patient left: in bed;with call bell/phone within reach;with bed alarm set;with family/visitor present Nurse Communication: Mobility status PT Visit Diagnosis: Other abnormalities of gait and mobility (R26.89);Muscle weakness (generalized) (M62.81);Unsteadiness on feet  (R26.81)    Time: 4982-6415 PT Time Calculation (min) (ACUTE ONLY): 22 min   Charges:   PT Evaluation $PT Eval Moderate Complexity: 1 Mod         [address] Ludwig, SPT   [place] Shakaria Raphael 01/09/2022, 10:48 AM

## 2022-01-09 NOTE — Progress Notes (Signed)
[facility] PROGRESS NOTE    Progress Note  Alexa Buchanan  ZOX:[id number] DOB: March 03, 1950 DOA: 01/07/2022 PCP: Alysia Penna, MD     Brief Narrative:   Alexa Buchanan is an 71 y.o. female past medical history of diabetes mellitus type 2, primary biliary cirrhosis, IBS comes in to [facility] after his calcium came high on routine labs he has been having some malaise nausea and difficulty gripping things along with confusion (previous calcium of 10.0) on admission of 15.  He was given zoledronic as it, calcitonin and started on aggressive IV fluid hydration   Assessment/Plan:   Hypercalcemia: Started on aggressive IV fluid hydration given Zometa and calcitonin. Strict I's and O's are poorly recorded. His calcium this morning is 10. SPEP and UPEP are pending. Check a  TSH and a PTH.  Vitamin D came back at 69  Acute kidney injury: Likely due to hypercalcemia resolved with full resuscitation. Last creatinine was less than 0.8. On admission of 3 after fluid resuscitation today is 2.71. Continue IV fluids.  Uncontrolled type 2 diabetes mellitus with hypoglycemia, with long-term current use of insulin : Last hemoglobin A 1C of 5.3. At home is not on insulin he is on Ozempic.  Essential hypertension: Started on Norvasc 5.   DVT prophylaxis: lovenox Family Communication:none Status is: Inpatient Remains inpatient appropriate because: Hypercalcemia and acute kidney injury    Code Status:     Code Status Orders  (From admission, onward)           Start     Ordered   01/07/22 1838  Full code  Continuous        01/07/22 1837           Code Status History     This patient has a current code status but no historical code status.      Advance Directive Documentation    Flowsheet Row Most Recent Value  Type of Advance Directive Healthcare Power of Attorney, Living will  Pre-existing out of facility DNR order (yellow form or pink MOST  form) --  "MOST" Form in Place? --         IV Access:   Peripheral IV   Procedures and diagnostic studies:   No results found.   Medical Consultants:   None.   Subjective:    [name] no complaints  Objective:    Vitals:   01/08/22 1941 01/08/22 2247 01/09/22 0052 01/09/22 0428  BP: (!) 139/58 (!) 155/67  (!) 142/52  Pulse:      Resp:  17  20  Temp: 99.4 F (37.4 C) 98.7 F (37.1 C)  98.7 F (37.1 C)  TempSrc: Oral Oral  Oral  SpO2: 92% 94%  94%  Weight:   69.7 kg   Height:       SpO2: 94 %   Intake/Output Summary (Last 24 hours) at 01/09/2022 0646 Last data filed at 01/09/2022 0600 Gross per 24 hour  Intake 600 ml  Output 1600 ml  Net -1000 ml   Filed Weights   01/07/22 0807 01/09/22 0052  Weight: 68 kg 69.7 kg    Exam: General exam: In no acute distress. Respiratory system: Good air movement and clear to auscultation. Cardiovascular system: S1 & S2 heard, RRR. No JVD. Gastrointestinal system: Abdomen is nondistended, soft and nontender.  Extremities: No pedal edema. Skin: No rashes, lesions or ulcers Psychiatry: Judgement and insight appear normal. Mood & affect appropriate.    Data  Reviewed:    Labs: Basic Metabolic Panel: Recent Labs  Lab 01/07/22 0829 01/07/22 1855 01/08/22 0010 01/09/22 0021  NA 138 141 139 140  K 4.0 4.0 3.7 3.5  CL 99 103 106 113*  CO2 31 24 28  20*  GLUCOSE 94 74 89 95  BUN 40* 35* 33* 23  CREATININE 3.48* 3.25* 3.14* 2.71*  CALCIUM >15.0* 13.0* 12.3* 10.0  MG 2.6*  --   --   --    GFR Estimated Creatinine Clearance: 17.7 mL/min (A) (by C-G formula based on SCr of 2.71 mg/dL (H)). Liver Function Tests: Recent Labs  Lab 01/07/22 0829 01/07/22 1855 01/08/22 0010 01/09/22 0021  AST 16 23 20 25   ALT 15 16 15 18   ALKPHOS 79 60 63 57  BILITOT 0.6 0.6 0.4 0.3  PROT 6.6 5.5* 5.0* 5.1*  ALBUMIN 3.9 3.1* 2.9* 2.8*   No results for input(s): "LIPASE", "AMYLASE" in the last 168 hours. No  results for input(s): "AMMONIA" in the last 168 hours. Coagulation profile No results for input(s): "INR", "PROTIME" in the last 168 hours. COVID-19 Labs  No results for input(s): "DDIMER", "FERRITIN", "LDH", "CRP" in the last 72 hours.  No results found for: "SARSCOV2NAA"  CBC: Recent Labs  Lab 01/07/22 0829 01/07/22 1855 01/08/22 0010 01/09/22 0021  WBC 8.8 9.3 9.4 7.1  NEUTROABS 5.8  --   --   --   HGB 13.1 11.9* 11.2* 10.7*  HCT 38.6 33.8* 31.7* 32.5*  MCV 92.8 91.8 91.9 95.3  PLT 279 258 229 190   Cardiac Enzymes: No results for input(s): "CKTOTAL", "CKMB", "CKMBINDEX", "TROPONINI" in the last 168 hours. BNP (last 3 results) No results for input(s): "PROBNP" in the last 8760 hours. CBG: Recent Labs  Lab 01/07/22 2214 01/08/22 0630 01/08/22 1329 01/08/22 1703 01/08/22 2133  GLUCAP 99 90 85 102* 83   D-Dimer: No results for input(s): "DDIMER" in the last 72 hours. Hgb A1c: Recent Labs    01/07/22 1849  HGBA1C 5.3   Lipid Profile: No results for input(s): "CHOL", "HDL", "LDLCALC", "TRIG", "CHOLHDL", "LDLDIRECT" in the last 72 hours. Thyroid function studies: No results for input(s): "TSH", "T4TOTAL", "T3FREE", "THYROIDAB" in the last 72 hours.  Invalid input(s): "FREET3" Anemia work up: No results for input(s): "VITAMINB12", "FOLATE", "FERRITIN", "TIBC", "IRON", "RETICCTPCT" in the last 72 hours. Sepsis Labs: Recent Labs  Lab 01/07/22 0829 01/07/22 1855 01/08/22 0010 01/09/22 0021  WBC 8.8 9.3 9.4 7.1   Microbiology Recent Results (from the past 240 hour(s))  MRSA Next Gen by PCR, Nasal     Status: None   Collection Time: 01/07/22  7:14 PM   Specimen: Nasal Mucosa; Nasal Swab  Result Value Ref Range Status   MRSA by PCR Next Gen NOT DETECTED NOT DETECTED Final    Comment: (NOTE) The GeneXpert MRSA Assay (FDA approved for NASAL specimens only), is one component of a comprehensive MRSA colonization surveillance program. It is not intended to  diagnose MRSA infection nor to guide or monitor treatment for MRSA infections. Test performance is not FDA approved in patients less than 4 years old. Performed at [facility], [address], [place], [facility] [address]      Medications:    amLODipine  5 mg Oral Daily   heparin  5,000 Units Subcutaneous Q8H   insulin aspart  0-9 Units Subcutaneous TID WC   Continuous Infusions:  sodium chloride 150 mL/hr at 01/09/22 0559      LOS: 2 days  Marinda Elk  [facility]  01/09/2022, 6:46 AM

## 2022-01-09 NOTE — Evaluation (Signed)
Occupational Therapy Evaluation Patient Details Name: Alexa Buchanan MRN: [id number] DOB: 09/02/1950 Today's Date: 01/09/2022   History of Present Illness Pt is a 71 y/o F presenting to ED after increased Calcium levels (17) found after lab work at PCP. Admitted for hypercalcemia and AKI. PMH includes DM2, primary biliary cirrhosis, IBS, arthritis   Clinical Impression   Pt reports needing assist at baseline for ADLs since prior falls (in the past month), uses cane for mobility and lives with spouse who can provide assist at d/c. Pt currently needing min guard-min A for ADLs, supervision for bed mobility, and min guard for transfers with and without RW. Pt able to perform self-feeding and grooming tasks without difficulty at time of session, however pt reports difficulty with grasp and reports dropping things at home, administered fine motor exercise sheet, pt verbalized understanding. Pt presenting with impairments listed below, will follow acutely. Recommend HHOT at d/c.      Recommendations for follow up therapy are one component of a multi-disciplinary discharge planning process, led by the attending physician.  Recommendations may be updated based on patient status, additional functional criteria and insurance authorization.   Follow Up Recommendations  Home health OT (may progress to no HHOT needs)    Assistance Recommended at Discharge Intermittent Supervision/Assistance  Patient can return home with the following A little help with walking and/or transfers;A little help with bathing/dressing/bathroom;Assistance with cooking/housework;Direct supervision/assist for medications management;Direct supervision/assist for financial management;Assist for transportation;Help with stairs or ramp for entrance    Functional Status Assessment  Patient has had a recent decline in their functional status and demonstrates the ability to make significant improvements in function in a reasonable and  predictable amount of time.  Equipment Recommendations  BSC/3in1    Recommendations for Other Services PT consult     Precautions / Restrictions Precautions Precautions: Fall Precaution Comments: x2-3 falls in the past month Restrictions Weight Bearing Restrictions: No      Mobility Bed Mobility Overal bed mobility: Needs Assistance Bed Mobility: Supine to Sit, Sit to Supine     Supine to sit: Supervision Sit to supine: Supervision   General bed mobility comments: increased time    Transfers Overall transfer level: Needs assistance Equipment used: Rolling walker (2 wheels) Transfers: Sit to/from Stand Sit to Stand: Min guard           General transfer comment: with and without use of RW      Balance Overall balance assessment: Mild deficits observed, not formally tested                                         ADL either performed or assessed with clinical judgement   ADL Overall ADL's : Needs assistance/impaired Eating/Feeding: Modified independent   Grooming: Min guard;Standing;Oral care   Upper Body Bathing: Minimal assistance   Lower Body Bathing: Minimal assistance   Upper Body Dressing : Minimal assistance   Lower Body Dressing: Minimal assistance Lower Body Dressing Details (indicate cue type and reason): donning underwear Toilet Transfer: Min guard;Rolling walker (2 wheels);Ambulation;Regular Social worker and Hygiene: Supervision/safety Toileting - Clothing Manipulation Details (indicate cue type and reason): pericare     Functional mobility during ADLs: Min guard;Rolling walker (2 wheels)       Vision   Vision Assessment?: No apparent visual deficits     Perception     Praxis  Pertinent Vitals/Pain Pain Assessment Pain Assessment: Faces Pain Score: 4  Faces Pain Scale: Hurts little more Pain Location: chronic back pain Pain Descriptors / Indicators: Discomfort Pain  Intervention(s): Limited activity within patient's tolerance, Monitored during session     Hand Dominance Left   Extremity/Trunk Assessment Upper Extremity Assessment Upper Extremity Assessment: Generalized weakness (reports difficulty with fine motor coordination, WFL for basic ADL tasks)   Lower Extremity Assessment Lower Extremity Assessment: Defer to PT evaluation   Cervical / Trunk Assessment Cervical / Trunk Assessment: Normal   Communication Communication Communication: No difficulties   Cognition Arousal/Alertness: Awake/alert Behavior During Therapy: WFL for tasks assessed/performed Overall Cognitive Status: Within Functional Limits for tasks assessed                                 General Comments: needing some min cues to remain on task     General Comments  VSS on RA, spouse in room    Exercises Exercises: Other exercises Other Exercises Other Exercises: administered fine motor coordination exercises/handout for pt   Shoulder Instructions      Home Living Family/patient expects to be discharged to:: Private residence Living Arrangements: Spouse/significant other Available Help at Discharge: Family;Available 24 hours/day Type of Home: House Home Access: Stairs to enter [employer] of Steps: 3 Entrance Stairs-Rails: Can reach both Home Layout: Two level;Able to live on main level with bedroom/bathroom Alternate Level Stairs-Number of Steps: flight   Bathroom Shower/Tub: Walk-in shower (stall shower)     Bathroom Accessibility: Yes How Accessible: Accessible via walker (on the first floor) Home Equipment: Grab bars - tub/shower;Cane - single point          Prior Functioning/Environment Prior Level of Function : Needs assist             Mobility Comments: used cane for mobility; reports prior falls x2 within the last month ADLs Comments: spouse assists since pt has fallen        OT Problem List: Decreased  strength;Decreased range of motion;Decreased activity tolerance;Impaired balance (sitting and/or standing);Decreased safety awareness      OT Treatment/Interventions: Self-care/ADL training;Therapeutic exercise;Energy conservation;DME and/or AE instruction;Therapeutic activities;Patient/family education;Balance training    OT Goals(Current goals can be found in the care plan section) Acute Rehab OT Goals Patient Stated Goal: none stated OT Goal Formulation: With patient Time For Goal Achievement: 01/23/22 Potential to Achieve Goals: Good ADL Goals Pt Will Perform Lower Body Dressing: Independently;sit to/from stand Pt Will Perform Tub/Shower Transfer: Independently;ambulating;Tub transfer;Shower transfer Additional ADL Goal #1: pt will be able to stand x5 min for functional task in order to improve activity tolerance for ADLs  OT Frequency: Min 2X/week    Co-evaluation              AM-PAC OT "6 Clicks" Daily Activity     Outcome Measure Help from another person eating meals?: None Help from another person taking care of personal grooming?: None Help from another person toileting, which includes using toliet, bedpan, or urinal?: None Help from another person bathing (including washing, rinsing, drying)?: A Little Help from another person to put on and taking off regular upper body clothing?: A Little Help from another person to put on and taking off regular lower body clothing?: A Little 6 Click Score: 21   End of Session Equipment Utilized During Treatment: Rolling walker (2 wheels) Nurse Communication: Mobility status;Other (comment) (pt urinated, left in [facility])  Activity  Tolerance: Patient tolerated treatment well Patient left: in bed;with family/visitor present;with call bell/phone within reach  OT Visit Diagnosis: Unsteadiness on feet (R26.81);Other abnormalities of gait and mobility (R26.89);Muscle weakness (generalized) (M62.81);History of falling (Z91.81)                 Time: 4742-5956 OT Time Calculation (min): 22 min Charges:  OT General Charges $OT Visit: 1 Visit OT Evaluation $OT Eval Moderate Complexity: 1 [address], OTD, OTR/L Acute Rehab [phone]   Mayer Masker 01/09/2022, 8:57 AM

## 2022-01-10 DIAGNOSIS — Z794 Long term (current) use of insulin: Secondary | ICD-10-CM

## 2022-01-10 DIAGNOSIS — N179 Acute kidney failure, unspecified: Secondary | ICD-10-CM

## 2022-01-10 DIAGNOSIS — E11649 Type 2 diabetes mellitus with hypoglycemia without coma: Secondary | ICD-10-CM | POA: Diagnosis not present

## 2022-01-10 LAB — BASIC METABOLIC PANEL
Anion gap: 10 (ref 5–15)
BUN: 20 mg/dL (ref 8–23)
CO2: 23 mmol/L (ref 22–32)
Calcium: 8.7 mg/dL — ABNORMAL LOW (ref 8.9–10.3)
Chloride: 105 mmol/L (ref 98–111)
Creatinine, Ser: 2.2 mg/dL — ABNORMAL HIGH (ref 0.44–1.00)
GFR, Estimated: 23 mL/min — ABNORMAL LOW (ref >60)
Glucose, Bld: 94 mg/dL (ref 70–99)
Potassium: 3.2 mmol/L — ABNORMAL LOW (ref 3.5–5.1)
Sodium: 138 mmol/L (ref 135–145)

## 2022-01-10 LAB — GLUCOSE, CAPILLARY
Glucose-Capillary: 98 mg/dL (ref 70–99)
Glucose-Capillary: 111 mg/dL — ABNORMAL HIGH (ref 70–99)
Glucose-Capillary: 100 mg/dL — ABNORMAL HIGH (ref 70–99)
Glucose-Capillary: 112 mg/dL — ABNORMAL HIGH (ref 70–99)

## 2022-01-10 LAB — CALCIUM, IONIZED: Calcium, Ionized, Serum: 8.3 mg/dL — ABNORMAL HIGH (ref 4.5–5.6)

## 2022-01-10 MED ORDER — SODIUM CHLORIDE 0.45 % IV SOLN: INTRAVENOUS | Status: DC

## 2022-01-10 MED ORDER — POTASSIUM CHLORIDE CRYS ER 20 MEQ PO TBCR
40.0000 meq | EXTENDED_RELEASE_TABLET | Freq: Once | ORAL | Status: AC
  Administered 2022-01-10: 40 meq via ORAL
  Filled 2022-01-10: qty 2

## 2022-01-10 NOTE — Progress Notes (Signed)
24 hour urine sent to lab at 2344. Requisition printed and sent with specimen container. Lab tech excepted container.

## 2022-01-10 NOTE — Care Management Important Message (Signed)
Important Message  Patient Details  Name: Alexa Buchanan MRN: [id number] Date of Birth: 06/18/1950   Medicare Important Message Given:  Yes     [place] Stefan Church 01/10/2022, 2:39 PM

## 2022-01-10 NOTE — Progress Notes (Signed)
[facility]                                                                               Alexa Buchanan, is a 71 y.o. female, DOB - 27-Aug-1950, RCB:[id number] Admit date - 01/07/2022    Outpatient Primary MD for the patient is Alysia Penna, MD  LOS - 3  days    Brief summary   Alexa Buchanan is an 71 y.o. female past medical history of diabetes mellitus type 2, primary biliary cirrhosis, IBS [facility] after his calcium came high on routine labs he has been having some malaise nausea and difficulty gripping things along with confusion (previous calcium of 10.0) on admission of 15.  He was given zoledronic as it, calcitonin and started on aggressive IV fluid hydration    Assessment & Plan    Assessment and Plan:    Hypercalcemia: Started on aggressive IV fluid hydration given Zometa and calcitonin.  Much improved.  Continue to monitor.  SPEP and UPEP are pending. TSH IS WNL.  PTH is pending.   Vitamin D came back at 37   Acute kidney injury: Suspect from pre renal causes, dehydration.  Slowly improving. With IV fluids.  Creatinine not back to baseline.     Uncontrolled type 2 diabetes mellitus with hypoglycemia, with long-term current use of insulin : Last hemoglobin A 1C of 5.3. At home is not on insulin she is on Ozempic. CBG (last 3)  Recent Labs    01/10/22 0541 01/10/22 1125 01/10/22 1618  GLUCAP 98 111* 100*   Resume SSI.    Essential hypertension: BP parameters are optimal.  Continue to monitor.  Estimated body mass index is 26.75 kg/m as calculated from the following:   Height as of this encounter: 5\' 4"  (1.626 m).   Weight as of this encounter: 70.7 kg.  Code Status: full code.  DVT Prophylaxis:  heparin injection 5,000 Units Start: 01/07/22 2200   Level of Care: Level of care: Progressive Family Communication: Updated FAMILY AT BEDSIDE.   Disposition Plan:     Remains inpatient appropriate:   aki  Procedures:  None.   Consultants:   None.   Antimicrobials:   Anti-infectives (From admission, onward)    None        Medications  Scheduled Meds:  amLODipine  5 mg Oral Daily   heparin  5,000 Units Subcutaneous Q8H   insulin aspart  0-9 Units Subcutaneous TID WC   Continuous Infusions:  sodium chloride 75 mL/hr at 01/10/22 0919   PRN Meds:.HYDROcodone-acetaminophen, ondansetron **OR** ondansetron (ZOFRAN) IV, zolpidem    Subjective:   Alexa Buchanan was seen and examined today.   Feeling a lot better today.    Objective:   Vitals:   01/09/22 2309 01/10/22 0317 01/10/22 0500 01/10/22 0723  BP: 139/60 (!) 116/46  (!) 158/59  Pulse: 70 70  68  Resp: 20 15  15   Temp: 98.7 F (37.1 C) 98.6 F (37 C)  98.6 F (37 C)  TempSrc: Oral Oral  Oral  SpO2: 96% 94%  96%  Weight:   70.7 kg   Height:  Intake/Output Summary (Last 24 hours) at 01/10/2022 1134 Last data filed at 01/10/2022 0724 Gross per 24 hour  Intake 989.52 ml  Output 1050 ml  Net -60.48 ml   Filed Weights   01/07/22 0807 01/09/22 0052 01/10/22 0500  Weight: 68 kg 69.7 kg 70.7 kg     Exam General: Alert and oriented x 3, NAD Cardiovascular: S1 S2 auscultated, no murmurs, RRR Respiratory: Clear to auscultation bilaterally, no wheezing, rales or rhonchi Gastrointestinal: Soft, nontender, nondistended, + bowel sounds Ext: no pedal edema bilaterally Neuro: AAOx3, Cr N's II- XII. Strength 5/5 upper and lower extremities bilaterally Skin: No rashes Psych: Normal affect and demeanor, alert and oriented x3    Data Reviewed:  I have personally reviewed following labs and imaging studies   CBC Lab Results  Component Value Date   WBC 7.1 01/09/2022   RBC 3.41 (L) 01/09/2022   HGB 10.7 (L) 01/09/2022   HCT 32.5 (L) 01/09/2022   MCV 95.3 01/09/2022   MCH 31.4 01/09/2022   PLT 190 01/09/2022   MCHC 32.9 01/09/2022   RDW 13.2 01/09/2022   LYMPHSABS 2.1 01/07/2022   MONOABS 0.6  01/07/2022   EOSABS 0.2 01/07/2022   BASOSABS 0.1 01/07/2022     Last metabolic panel Lab Results  Component Value Date   NA 138 01/10/2022   K 3.2 (L) 01/10/2022   CL 105 01/10/2022   CO2 23 01/10/2022   BUN 20 01/10/2022   CREATININE 2.20 (H) 01/10/2022   GLUCOSE 94 01/10/2022   GFRNONAA 23 (L) 01/10/2022   GFRAA >60 07/19/2015   CALCIUM 8.7 (L) 01/10/2022   PROT 5.1 (L) 01/09/2022   ALBUMIN 2.8 (L) 01/09/2022   BILITOT 0.3 01/09/2022   ALKPHOS 57 01/09/2022   AST 25 01/09/2022   ALT 18 01/09/2022   ANIONGAP 10 01/10/2022    CBG (last 3)  Recent Labs    01/09/22 2117 01/10/22 0541 01/10/22 1125  GLUCAP 89 98 111*      Coagulation Profile: No results for input(s): "INR", "PROTIME" in the last 168 hours.   Radiology Studies: No results found.     Kathlen Mody M.D. [facility] 01/10/2022, 11:34 AM  Available via Epic secure chat 7am-7pm After 7 pm, please refer to night coverage provider listed on amion.

## 2022-01-10 NOTE — Progress Notes (Signed)
Mobility Specialist Progress Note    01/10/22 1547  Mobility  Activity Ambulated with assistance in hallway  Level of Assistance Standby assist, set-up cues, supervision of patient - no hands on  Assistive Device Four wheel walker  Distance Ambulated (ft) 320 ft  Activity Response Tolerated well  Mobility Referral Yes  $Mobility charge 1 Mobility   Pre-Mobility: 77 HR During Mobility: 85 HR Post-Mobility: 70 HR  Pt received sitting EOB and agreeable. No complaints on walk. Returned to bed with call bell in reach.     [place] Mobility Specialist  Please Neurosurgeon or Rehab Office at [phone]

## 2022-01-10 NOTE — TOC Transition Note (Addendum)
Transition of Care [facility]) - CM/SW Discharge Note   Patient Details  Name: Alexa Buchanan MRN: [id number] Date of Birth: 1950/07/27  [facility] of Care [facility] CM/SW Contact:  Leone Haven, RN Phone Number: 01/10/2022, 10:55 AM   Clinical Narrative:    Patient is for possible dc , she is set up with [facility], [employer] has supplied the rollator to the room and the Bedside commode will be delivered to her home per husband's request.    Final next level of care: Home w Home Health Services Barriers to Discharge: Continued Medical Work up   Patient Goals and CMS Choice Patient states their goals for this hospitalization and ongoing recovery are:: return home CMS Medicare.gov Compare Post Acute Care list provided to:: Patient Represenative (must comment) Choice offered to / list presented to : Spouse  Discharge Placement                       Discharge Plan and Services In-house Referral: NA Discharge Planning Services: CM Consult Post Acute Care Choice: Home Health          DME Arranged: Walker rolling with seat DME Agency: [employer] Date DME Agency Contacted: 01/09/22 Time DME Agency Contacted: [phone] Representative spoke with at DME Agency: [employer] Rep HH Arranged: PT, OT HH Agency: [facility] Date [facility] Agency Contacted: 01/09/22 Time HH Agency Contacted: 1644 Representative spoke with at [facility] Agency: [name]  Social Determinants of Health (SDOH) Interventions     Readmission Risk Interventions     No data to display

## 2022-01-10 NOTE — Progress Notes (Signed)
Mobility Specialist Progress Note    01/10/22 0847  Mobility  Activity Ambulated with assistance in hallway  Level of Assistance Standby assist, set-up cues, supervision of patient - no hands on  Assistive Device Four wheel walker  Distance Ambulated (ft) 320 ft  Activity Response Tolerated well  Mobility Referral Yes  $Mobility charge 1 Mobility   Pre-Mobility: 80 HR Post-Mobility: 81 HR  Pt received sitting EOB and agreeable. No complaints on walk. Returned to sink to brush teeth with husband present.   [place] Mobility Specialist  Please Neurosurgeon or Rehab Office at [phone]

## 2022-01-11 DIAGNOSIS — E876 Hypokalemia: Secondary | ICD-10-CM | POA: Diagnosis not present

## 2022-01-11 DIAGNOSIS — N179 Acute kidney failure, unspecified: Secondary | ICD-10-CM | POA: Diagnosis not present

## 2022-01-11 DIAGNOSIS — E440 Moderate protein-calorie malnutrition: Secondary | ICD-10-CM | POA: Insufficient documentation

## 2022-01-11 LAB — GLUCOSE, CAPILLARY
Glucose-Capillary: 99 mg/dL (ref 70–99)
Glucose-Capillary: 139 mg/dL — ABNORMAL HIGH (ref 70–99)
Glucose-Capillary: 112 mg/dL — ABNORMAL HIGH (ref 70–99)
Glucose-Capillary: 101 mg/dL — ABNORMAL HIGH (ref 70–99)

## 2022-01-11 LAB — RENAL FUNCTION PANEL
Albumin: 2.7 g/dL — ABNORMAL LOW (ref 3.5–5.0)
Anion gap: 9 (ref 5–15)
BUN: 14 mg/dL (ref 8–23)
CO2: 23 mmol/L (ref 22–32)
Calcium: 8.5 mg/dL — ABNORMAL LOW (ref 8.9–10.3)
Chloride: 108 mmol/L (ref 98–111)
Creatinine, Ser: 1.75 mg/dL — ABNORMAL HIGH (ref 0.44–1.00)
GFR, Estimated: 30 mL/min — ABNORMAL LOW (ref >60)
Glucose, Bld: 95 mg/dL (ref 70–99)
Phosphorus: 1.6 mg/dL — ABNORMAL LOW (ref 2.5–4.6)
Potassium: 3.4 mmol/L — ABNORMAL LOW (ref 3.5–5.1)
Sodium: 140 mmol/L (ref 135–145)

## 2022-01-11 LAB — PTH, INTACT AND CALCIUM
Calcium, Total (PTH): 9.4 mg/dL (ref 8.7–10.3)
PTH: 23 pg/mL (ref 15–65)

## 2022-01-11 LAB — MAGNESIUM: Magnesium: 1.6 mg/dL — ABNORMAL LOW (ref 1.7–2.4)

## 2022-01-11 LAB — PARATHYROID HORMONE, INTACT (NO CA): PTH: 16 pg/mL (ref 15–65)

## 2022-01-11 MED ORDER — POLYETHYLENE GLYCOL 3350 17 G PO PACK
17.0000 g | PACK | Freq: Every day | ORAL | Status: DC
  Filled 2022-01-11: qty 1

## 2022-01-11 MED ORDER — BISACODYL 10 MG RE SUPP
10.0000 mg | Freq: Once | RECTAL | Status: DC
  Filled 2022-01-11: qty 1

## 2022-01-11 MED ORDER — SENNOSIDES-DOCUSATE SODIUM 8.6-50 MG PO TABS
1.0000 | ORAL_TABLET | Freq: Every day | ORAL | Status: DC
  Filled 2022-01-11: qty 1

## 2022-01-11 MED ORDER — POTASSIUM CHLORIDE CRYS ER 20 MEQ PO TBCR
40.0000 meq | EXTENDED_RELEASE_TABLET | Freq: Once | ORAL | Status: DC
  Filled 2022-01-11: qty 2

## 2022-01-11 MED ORDER — PROSOURCE PLUS PO LIQD
30.0000 mL | Freq: Two times a day (BID) | ORAL | Status: DC
  Administered 2022-01-11 – 2022-01-12 (×2): 30 mL via ORAL
  Filled 2022-01-11 (×2): qty 30

## 2022-01-11 MED ORDER — MAGNESIUM SULFATE 2 GM/50ML IV SOLN
2.0000 g | Freq: Once | INTRAVENOUS | Status: AC
  Administered 2022-01-11: 2 g via INTRAVENOUS
  Filled 2022-01-11: qty 50

## 2022-01-11 MED ORDER — K PHOS MONO-SOD PHOS DI & MONO 155-852-130 MG PO TABS
500.0000 mg | ORAL_TABLET | Freq: Two times a day (BID) | ORAL | Status: AC
  Administered 2022-01-11 (×2): 500 mg via ORAL
  Filled 2022-01-11 (×2): qty 2

## 2022-01-11 MED ORDER — SODIUM CHLORIDE 0.45 % IV SOLN: INTRAVENOUS | Status: AC

## 2022-01-11 MED ORDER — POTASSIUM CHLORIDE 20 MEQ PO PACK
40.0000 meq | PACK | Freq: Once | ORAL | Status: AC
  Administered 2022-01-11: 40 meq via ORAL
  Filled 2022-01-11: qty 2

## 2022-01-11 NOTE — Progress Notes (Signed)
Occupational Therapy Treatment Patient Details Name: Alexa Buchanan MRN: [id number] DOB: 1950-06-06 Today's Date: 01/11/2022   History of present illness Pt is a 71 y/o F presenting to ED on 01/07/22 after increased Calcium levels (17) found after lab work at PCP. Admitted for hypercalcemia and AKI. PMH includes DM2, primary biliary cirrhosis, IBS, arthritis, back pain   OT comments  Alexa Buchanan demonstrated excellent progress. Overall she was mod I for all ADL and mobility tasks assessed this date. She has met her acute OT goals. Educated pt to continue to stay activie while in acute care, and mobilize frequently with staff. She does not have further acute OT needs. Thank you.   Recommendations for follow up therapy are one component of a multi-disciplinary discharge planning process, led by the attending physician.  Recommendations may be updated based on patient status, additional functional criteria and insurance authorization.    Follow Up Recommendations  Home health OT     Assistance Recommended at Discharge Intermittent Supervision/Assistance  Patient can return home with the following  A little help with walking and/or transfers;A little help with bathing/dressing/bathroom;Assistance with cooking/housework;Direct supervision/assist for medications management;Direct supervision/assist for financial management;Assist for transportation;Help with stairs or ramp for entrance   Equipment Recommendations  BSC/3in1       Precautions / Restrictions Precautions Precautions: Fall;Other (comment) Precaution Comments: x2-3 falls in the past month Restrictions Weight Bearing Restrictions: No       Mobility Bed Mobility Overal bed mobility: Modified Independent                  Transfers Overall transfer level: Needs assistance Equipment used: Rollator (4 wheels) Transfers: Sit to/from Stand Sit to Stand: Modified independent (Device/Increase time)                  Balance Overall balance assessment: No apparent balance deficits (not formally assessed)                                         ADL either performed or assessed with clinical judgement   ADL Overall ADL's : Modified independent                                     Functional mobility during ADLs: Modified independent;Rollator (4 wheels) General ADL Comments: Dressed, toileting, groomed and completed mobility with mod I given rollator    Extremity/Trunk Assessment Upper Extremity Assessment Upper Extremity Assessment: Generalized weakness   Lower Extremity Assessment Lower Extremity Assessment: Generalized weakness        Vision   Vision Assessment?: No apparent visual deficits   Perception     Praxis      Cognition Arousal/Alertness: Awake/alert Behavior During Therapy: WFL for tasks assessed/performed Overall Cognitive Status: Within Functional Limits for tasks assessed                                          Exercises      Shoulder Instructions       General Comments VSS on RA, husband present    Pertinent Vitals/ Pain       Pain Assessment Pain Assessment: Faces Faces Pain Scale: No hurt Pain Intervention(s): Monitored during session   Progress Toward  Goals  OT Goals(current goals can now be found in the care plan section)  Progress towards OT goals: Progressing toward goals  Acute Rehab OT Goals Patient Stated Goal: to get stronger OT Goal Formulation: With patient ADL Goals Pt Will Perform Lower Body Dressing: Independently;sit to/from stand Pt Will Perform Tub/Shower Transfer: Independently;ambulating;Tub transfer;Shower transfer Additional ADL Goal #1: pt will be able to stand x5 min for functional task in order to improve activity tolerance for ADLs  Plan All goals met and education completed, patient discharged from [place] OT "6 Clicks" Daily Activity     Outcome  Measure   Help from another person eating meals?: None Help from another person taking care of personal grooming?: None Help from another person toileting, which includes using toliet, bedpan, or urinal?: None Help from another person bathing (including washing, rinsing, drying)?: None Help from another person to put on and taking off regular upper body clothing?: None Help from another person to put on and taking off regular lower body clothing?: None 6 Click Score: 24    End of Session Equipment Utilized During Treatment: Rollator (4 wheels)  OT Visit Diagnosis: Unsteadiness on feet (R26.81);Other abnormalities of gait and mobility (R26.89);Muscle weakness (generalized) (M62.81);History of falling (Z91.81)   Activity Tolerance Patient tolerated treatment well   Patient Left in bed;with family/visitor present;with call bell/phone within reach   Nurse Communication Mobility status;Other (comment)        Time: 2549-8264 OT Time Calculation (min): 20 min  Charges: OT General Charges $OT Visit: 1 Visit OT Treatments $Self Care/Home Management : 8-22 mins    Elliot Cousin 01/11/2022, 5:18 PM

## 2022-01-11 NOTE — Progress Notes (Signed)
Mobility Specialist Progress Note    01/11/22 1351  Mobility  Activity Refused mobility   Pt stated she just did not feel like walking right now. Will f/u as schedule permits.   [place] Mobility Specialist  Please Neurosurgeon or Rehab Office at [phone]

## 2022-01-11 NOTE — Progress Notes (Signed)
PROGRESS NOTE   Alexa Buchanan  XLK:[id number]    DOB: 1951-02-03    DOA: 01/07/2022  PCP: Alysia Penna, MD   I have briefly reviewed patients previous medical records in [facility].  Chief Complaint  Patient presents with   Weakness   Abnormal Lab    Brief Narrative:  71 year old married female with PMH of type II DM, controlled, primary biliary cirrhosis, IBS, presented to the [facility] ED after routine lab work with PCP showed hypercalcemia (serum calcium 15 on admission).  She had malaise, nausea, difficulty gripping things and confusion.  Treating for hypercalcemia, work-up for etiology thus far negative, possibly due to home over supplementation of calcium and for AKI.  Slowly improving.  Possible DC home 11/16   Assessment & Plan:  Principal Problem:   Hypercalcemia Active Problems:   Uncontrolled type 2 diabetes mellitus with hypoglycemia, with long-term current use of insulin (HCC)   AKI (acute kidney injury) (HCC)   Hypercalcemia: Presented with serum calcium >15.  Vitamin D, 25-hydroxy: 71.32 > 63.85.  Intact PTH and TSH WNL.  PTH RP: Pending.  SPEP and UPEP pending.  S/p aggressive IV fluid hydration, subcu calcitonin 11/11 - 11/12, zoledronic acid 4 mg IVPB x1 on 11/11.  Etiology unclear but may be related to excessive oral supplementation at home (MVI, calcium carbonate + vitamin D).  Patient and spouse feel that her symptoms might have started approximately at the same time that she started taking Neutrufol supplements for her hair approximately 8 weeks ago.  Will have pharmacy check this for calcium ingredients.  Improving, serum calcium down to 8.5 (uncorrected).  Recommend close outpatient follow-up and hold off of multivitamins until PCP follow-up with labs.  Acute kidney injury: Secondary to hypercalcemia, dehydration and related prerenal etiology.  Treated hypercalcemia as above.  Aggressive IV fluid hydration.  Creatinine has improved from 3.48  on admission to 1.75.  Last known creatinine 0.8 on 08/01/2018.  Improving.  Continue gentle IV fluids for additional 24 hours and follow BMP in AM.  Type II DM, controlled, hypoglycemia: A1c 5.3 suggests tight DM control.  Not on insulins but on Ozempic at home, currently on hold.  Last hypoglycemic episode with CBG of 68 on 11/11.  DC SSI and monitor CBGs closely.  Hypokalemia: Replace and follow.  Check and replace magnesium as needed.  Hypophosphatemia: Replace and follow.  Essential hypertension: Controlled.  Not on antihypertensives PTA.  Urinary incontinence: Continue oxybutynin, follows with urology outpatient.  Primary biliary cirrhosis: Stable.  Outpatient follow-up.  Normocytic anemia: Much of this may be dilutional from aggressive IV fluid hydration.  Outpatient follow-up.  Body mass index is 26.26 kg/m.   DVT prophylaxis: heparin injection 5,000 Units Start: 01/07/22 2200     Code Status: Full Code:  Family Communication: None at bedside Disposition:  Status is: Inpatient Remains inpatient appropriate because: Continuing IV fluids for improving AKI, replacing multiple electrolytes.  Hopeful DC home 11/16     Consultants:   None  Procedures:     Antimicrobials:      Subjective:  Overall feels better.  Able to eat breakfast for the first time she says.  Chronic low back pain, follows with outpatient orthopedics.  Generalized weakness but improving.  She feels like her symptoms start approximately at the time when she started taking new OTC hair supplements as noted above.  No acute issues reported by nursing.  Objective:   Vitals:   01/10/22 2353 01/11/22 0316 01/11/22 3664  01/11/22 0728  BP: (!) 136/54 (!) 146/55  (!) 148/58  Pulse: 65 70  68  Resp: 19 15  18   Temp: 98 F (36.7 C) 98.7 F (37.1 C)  98.3 F (36.8 C)  TempSrc: Oral Oral  Oral  SpO2: 93% 92%    Weight:   69.4 kg   Height:        General exam: Elderly female, moderately built and  thinly nourished lying comfortably propped up in bed without distress.  Oral mucosa moist. Respiratory system: Clear to auscultation. Respiratory effort normal. Cardiovascular system: S1 & S2 heard, RRR. No JVD, murmurs, rubs, gallops or clicks. No pedal edema.  Telemetry personally reviewed: Sinus rhythm, DC'd telemetry 11/15 Gastrointestinal system: Abdomen is nondistended, soft and nontender. No organomegaly or masses felt. Normal bowel sounds heard. Central nervous system: Alert and oriented. No focal neurological deficits. Extremities: Symmetric 5 x 5 power. Skin: No rashes, lesions or ulcers Psychiatry: Judgement and insight appear normal. Mood & affect appropriate.     Data Reviewed:   I have personally reviewed following labs and imaging studies   CBC: Recent Labs  Lab 01/07/22 0829 01/07/22 1855 01/08/22 0010 01/09/22 0021  WBC 8.8 9.3 9.4 7.1  NEUTROABS 5.8  --   --   --   HGB 13.1 11.9* 11.2* 10.7*  HCT 38.6 33.8* 31.7* 32.5*  MCV 92.8 91.8 91.9 95.3  PLT 279 258 229 190    Basic Metabolic Panel: Recent Labs  Lab 01/07/22 0829 01/07/22 1855 01/08/22 0010 01/09/22 0021 01/09/22 0903 01/10/22 0621 01/11/22 0639  NA 138 141 139 140  --  138 140  K 4.0 4.0 3.7 3.5  --  3.2* 3.4*  CL 99 103 106 113*  --  105 108  CO2 31 24 28  20*  --  23 23  GLUCOSE 94 74 89 95  --  94 95  BUN 40* 35* 33* 23  --  20 14  CREATININE 3.48* 3.25* 3.14* 2.71*  --  2.20* 1.75*  CALCIUM >15.0* 13.0* 12.3* 10.0 9.4 8.7* 8.5*  MG 2.6*  --   --   --   --   --   --   PHOS  --   --   --   --   --   --  1.6*    Liver Function Tests: Recent Labs  Lab 01/07/22 0829 01/07/22 1855 01/08/22 0010 01/09/22 0021 01/11/22 0639  AST 16 23 20 25   --   ALT 15 16 15 18   --   ALKPHOS 79 60 63 57  --   BILITOT 0.6 0.6 0.4 0.3  --   PROT 6.6 5.5* 5.0* 5.1*  --   ALBUMIN 3.9 3.1* 2.9* 2.8* 2.7*    CBG: Recent Labs  Lab 01/10/22 1618 01/10/22 2149 01/11/22 0602  GLUCAP 100* 112* 99     Microbiology Studies:   Recent Results (from the past 240 hour(s))  MRSA Next Gen by PCR, Nasal     Status: None   Collection Time: 01/07/22  7:14 PM   Specimen: Nasal Mucosa; Nasal Swab  Result Value Ref Range Status   MRSA by PCR Next Gen NOT DETECTED NOT DETECTED Final    Comment: (NOTE) The GeneXpert MRSA Assay (FDA approved for NASAL specimens only), is one component of a comprehensive MRSA colonization surveillance program. It is not intended to diagnose MRSA infection nor to guide or monitor treatment for MRSA infections. Test performance is not FDA approved in patients less  than 2 years old. Performed at [facility], [address], [place], [place] [address]     Radiology Studies:  No results found.  Scheduled Meds:    amLODipine  5 mg Oral Daily   bisacodyl  10 mg Rectal Once   heparin  5,000 Units Subcutaneous Q8H   phosphorus  500 mg Oral BID   polyethylene glycol  17 g Oral Daily   potassium chloride  40 mEq Oral Once   senna-docusate  1 tablet Oral Daily    Continuous Infusions:    sodium chloride       LOS: 4 days     Marcellus Scott, MD,  FACP, FHM, SFHM, [facility], [facility]   [facility] [place]     To contact the attending provider between 7A-7P or the covering provider during after hours 7P-7A, please log into the web site [email or web address] and access using universal [place] password for that web site. If you do not have the password, please call the hospital operator.  01/11/2022, 8:11 AM

## 2022-01-11 NOTE — Plan of Care (Signed)

## 2022-01-11 NOTE — Progress Notes (Signed)
Initial Nutrition Assessment  DOCUMENTATION CODES:   Non-severe (moderate) malnutrition in context of social or environmental circumstances  INTERVENTION:  - Carb Modified diet - ProSource Plus BID, each supplement provides 100 calories and 15g protein, 76m calcium.  - Encourage intake of small and frequent meals to stimulate appetite and increase intake.    NUTRITION DIAGNOSIS:   Moderate Malnutrition related to poor appetite, social / environmental circumstances as evidenced by mild fat depletion, mild muscle depletion, 17% percent weight loss in 6 months.  GOAL:   Patient will meet greater than or equal to 90% of their needs  MONITOR:   PO intake, Supplement acceptance, Labs, Weight trends  REASON FOR ASSESSMENT:   Consult Assessment of nutrition requirement/status  ASSESSMENT:   71y.o. female with PMH DMII, cirrhosis, diverticulosis, and IBS who presented with hypercalcemia.  Met with patient and husband at bedside. Patient reports UBW of around 194# and weight loss within the past year, specifically after starting Ozempic and getting sick in September. Per EMR, patient weighed at 184# in May and now weighed at 153# - a 31# or 17% weight loss in 6 months, clinically significant. Patient confirms weight loss to be accurate.  Patient states that over the past few months she has really only been able to eat a few bites of solid food prepared by husband and drink fruit juice. Patient mixes orange juice, cranberry juice, and grape juice together and sips on it throughout the day. Notes minimal intake is a result of lack of appetite. Has also been feeling poorly since September.  Thankfully, patient reports she is eating a little more since being admitted and feeling better. Feels she does best with small bites of foods throughout the day. Encouraged continued small and frequent meals and snacks to increase intake and stimulate appetite. Patient inquiring what nutrition supplements  she can consume that doesn't have calcium. Discussed ProSource Plus and patient agreeable to receive during admission. She is hopeful she will like it and be able to continue consuming after admission for additional nutrition. Discussed plan to order ProSource Plus with MD. MD reports patient was taking "Nutrafol" PTA and inquiring if the supplement contains calcium. Did not see calcium in nutrition label or ingredients but recommended pharmacy investigate as well.   Medications reviewed and include: Miralax, Senokot  Labs reviewed:  K+ 3.4 - replaced Phos 1.6 - replaced Creatinine 1.75 (down trending) HA1C 5.3 Blood glucose 98-112 x24 hours   NUTRITION - FOCUSED PHYSICAL EXAM:  Flowsheet Row Most Recent Value  Orbital Region Mild depletion  Upper Arm Region Mild depletion  Thoracic and Lumbar Region Mild depletion  Buccal Region Mild depletion  Temple Region Moderate depletion  Clavicle Bone Region Mild depletion  Clavicle and Acromion Bone Region Mild depletion  Scapular Bone Region Unable to assess  Dorsal Hand Mild depletion  Patellar Region Mild depletion  Anterior Thigh Region Mild depletion  Posterior Calf Region Moderate depletion  Edema (RD Assessment) None  Hair Reviewed  Eyes Reviewed  Mouth Reviewed  Skin Reviewed  Nails Reviewed        Diet Order:   Diet Order             Diet Carb Modified Fluid consistency: Thin; Room service appropriate? Yes  Diet effective now                   EDUCATION NEEDS:  Education needs have been addressed  Skin:  Skin Assessment: Reviewed RN Assessment  Last BM:  unknown  Height:  Ht Readings from Last 1 Encounters:  01/07/22 _0  (1.626 m)   Weight:  Wt Readings from Last 1 Encounters:  01/11/22 69.4 kg    BMI:  Body mass index is 26.26 kg/m.  Estimated Nutritional Needs:  Kcal:  1650-1950 kcal Protein:  85-95 g Fluid:  >/= 1.6L    Samson Frederic RD, LDN For contact information, refer to [facility].

## 2022-01-11 NOTE — Progress Notes (Signed)
Physical Therapy Treatment Patient Details Name: Alexa Buchanan MRN: [id number] DOB: 1950-11-27 Today's Date: 01/11/2022   History of Present Illness Pt is a 71 y/o F presenting to ED on 01/07/22 after increased Calcium levels (17) found after lab work at PCP. Admitted for hypercalcemia and AKI. PMH includes DM2, primary biliary cirrhosis, IBS, arthritis, back pain    PT Comments    Pt steady progressing with mobility. Today's session focused on ambulation endurance and sit to stand transfers from various heights. Ambulated 320 ft supervision with rollator. Stood from room height toilet, supervision during pericare. Pt remains limited by generalized weakness, decreased activity tolerance, and impaired balance strategies/postural reactions. Continue to recommend acute PT services to maximize functional mobility and independence prior to d/c to HHPT.    Recommendations for follow up therapy are one component of a multi-disciplinary discharge planning process, led by the attending physician.  Recommendations may be updated based on patient status, additional functional criteria and insurance authorization.  Follow Up Recommendations  Home health PT     Assistance Recommended at Discharge Set up Supervision/Assistance  Patient can return home with the following A little help with walking and/or transfers;A little help with bathing/dressing/bathroom;Assist for transportation;Help with stairs or ramp for entrance;Assistance with cooking/housework   Equipment Recommendations  None recommended by PT    Recommendations for Other Services       Precautions / Restrictions Precautions Precautions: Fall;Other (comment) Precaution Comments: x2-3 falls in the past month Restrictions Weight Bearing Restrictions: No     Mobility  Bed Mobility Overal bed mobility: Needs Assistance Bed Mobility: Supine to Sit     Supine to sit: Modified independent (Device/Increase time), HOB elevated      General bed mobility comments: use of 1 handrail to sit EOB, able to ant/post scoot with increased time    Transfers Overall transfer level: Needs assistance Equipment used: Rollator (4 wheels) Transfers: Sit to/from Stand Sit to Stand: Supervision           General transfer comment: supervision for line mgmt; sit to stand from various surface heights throughout session    Ambulation/Gait Ambulation/Gait assistance: Supervision Gait Distance (Feet): 320 Feet Assistive device: Rollator (4 wheels) Gait Pattern/deviations: Step-to pattern, Decreased stride length, Narrow base of support Gait velocity: decreased     General Gait Details: able to negotiate rollator around hallway and obstacles without LOB; cued to inc BOS during gait; slow cautious gait   Stairs             Wheelchair Mobility    Modified Rankin (Stroke Patients Only)       Balance Overall balance assessment: Needs assistance Sitting-balance support: No upper extremity supported   Sitting balance - Comments: stable sitting EOB during clothing mgmt   Standing balance support: Single extremity supported, Bilateral upper extremity supported, During functional activity, No upper extremity supported Standing balance-Leahy Scale: Fair Standing balance comment: dynamic standing while performing pericare and able to weight shifting with appropriate foot placement                            Cognition Arousal/Alertness: Awake/alert Behavior During Therapy: WFL for tasks assessed/performed Overall Cognitive Status: Within Functional Limits for tasks assessed                                 General Comments: able to multitask during entire session, demonstrates ability  to understand when needing to void or additional wiping necessary after toileting        Exercises      General Comments General comments (skin integrity, edema, etc.): VSS on RA      Pertinent  Vitals/Pain Pain Assessment Faces Pain Scale: Hurts a little bit Pain Location: chronic back pain Pain Descriptors / Indicators: Discomfort, Aching Pain Intervention(s): Monitored during session, Repositioned    Home Living                          Prior Function            PT Goals (current goals can now be found in the care plan section) Acute Rehab PT Goals Patient Stated Goal: to feel better soon PT Goal Formulation: With patient Time For Goal Achievement: 01/23/22 Potential to Achieve Goals: Good Progress towards PT goals: Progressing toward goals    Frequency    Min 3X/week      PT Plan Current plan remains appropriate    Co-evaluation              AM-PAC PT "6 Clicks" Mobility   Outcome Measure  Help needed turning from your back to your side while in a flat bed without using bedrails?: None Help needed moving from lying on your back to sitting on the side of a flat bed without using bedrails?: A Little Help needed moving to and from a bed to a chair (including a wheelchair)?: A Little Help needed standing up from a chair using your arms (e.g., wheelchair or bedside chair)?: A Little Help needed to walk in hospital room?: A Little Help needed climbing 3-5 steps with a railing? : A Little 6 Click Score: 19    End of Session Equipment Utilized During Treatment: Gait belt Activity Tolerance: Patient tolerated treatment well Patient left: with call bell/phone within reach;in bed;with nursing/sitter in room Nurse Communication: Mobility status PT Visit Diagnosis: Other abnormalities of gait and mobility (R26.89);Muscle weakness (generalized) (M62.81);Unsteadiness on feet (R26.81)     Time: 3662-9476 PT Time Calculation (min) (ACUTE ONLY): 20 min  Charges:  $Therapeutic Exercise: 8-22 mins                     Alric Ran, SPT    Alexa Buchanan 01/11/2022, 9:27 AM

## 2022-01-12 ENCOUNTER — Telehealth: Payer: Self-pay | Admitting: Orthopaedic Surgery

## 2022-01-12 DIAGNOSIS — E876 Hypokalemia: Secondary | ICD-10-CM

## 2022-01-12 LAB — RENAL FUNCTION PANEL
Albumin: 2.8 g/dL — ABNORMAL LOW (ref 3.5–5.0)
Anion gap: 9 (ref 5–15)
BUN: 11 mg/dL (ref 8–23)
CO2: 24 mmol/L (ref 22–32)
Calcium: 8.1 mg/dL — ABNORMAL LOW (ref 8.9–10.3)
Chloride: 110 mmol/L (ref 98–111)
Creatinine, Ser: 1.52 mg/dL — ABNORMAL HIGH (ref 0.44–1.00)
GFR, Estimated: 36 mL/min — ABNORMAL LOW (ref >60)
Glucose, Bld: 102 mg/dL — ABNORMAL HIGH (ref 70–99)
Phosphorus: 1.8 mg/dL — ABNORMAL LOW (ref 2.5–4.6)
Potassium: 3.4 mmol/L — ABNORMAL LOW (ref 3.5–5.1)
Sodium: 143 mmol/L (ref 135–145)

## 2022-01-12 LAB — PROTEIN ELECTROPHORESIS, SERUM
A/G Ratio: 1.3 (ref 0.7–1.7)
Albumin ELP: 3 g/dL (ref 2.9–4.4)
Alpha-1-Globulin: 0.2 g/dL (ref 0.0–0.4)
Alpha-2-Globulin: 0.6 g/dL (ref 0.4–1.0)
Beta Globulin: 0.8 g/dL (ref 0.7–1.3)
Gamma Globulin: 0.7 g/dL (ref 0.4–1.8)
Globulin, Total: 2.3 g/dL (ref 2.2–3.9)
Total Protein ELP: 5.3 g/dL — ABNORMAL LOW (ref 6.0–8.5)

## 2022-01-12 LAB — UPEP/UIFE/LIGHT CHAINS/TP, 24-HR UR
% BETA, Urine: 34.2 %
ALPHA 1 URINE: 7.1 %
Albumin, U: 27.7 %
Alpha 2, Urine: 20.2 %
Free Kappa Lt Chains,Ur: 112.89 mg/L — ABNORMAL HIGH (ref 1.17–86.46)
Free Kappa/Lambda Ratio: 6.01 (ref 1.83–14.26)
Free Lambda Lt Chains,Ur: 18.79 mg/L — ABNORMAL HIGH (ref 0.27–15.21)
GAMMA GLOBULIN URINE: 10.8 %
Total Protein, Urine-Ur/day: 416 mg/24 hr — ABNORMAL HIGH (ref 30–150)
Total Protein, Urine: 18.1 mg/dL
Total Volume: 2300

## 2022-01-12 LAB — GLUCOSE, CAPILLARY
Glucose-Capillary: 101 mg/dL — ABNORMAL HIGH (ref 70–99)
Glucose-Capillary: 131 mg/dL — ABNORMAL HIGH (ref 70–99)

## 2022-01-12 MED ORDER — POTASSIUM CHLORIDE 20 MEQ PO PACK
40.0000 meq | PACK | Freq: Once | ORAL | Status: AC
  Administered 2022-01-12: 40 meq via ORAL
  Filled 2022-01-12: qty 2

## 2022-01-12 MED ORDER — AMLODIPINE BESYLATE 5 MG PO TABS: 5.0000 mg | ORAL_TABLET | Freq: Every day | ORAL | 1 refills | Status: DC

## 2022-01-12 MED ORDER — SODIUM PHOSPHATES 45 MMOLE/15ML IV SOLN
15.0000 mmol | Freq: Once | INTRAVENOUS | Status: AC
  Administered 2022-01-12: 15 mmol via INTRAVENOUS
  Filled 2022-01-12: qty 5

## 2022-01-12 MED ORDER — URSODIOL 300 MG PO CAPS: ORAL_CAPSULE | ORAL | Status: AC

## 2022-01-12 MED ORDER — POLYETHYLENE GLYCOL 3350 17 G PO PACK: 17.0000 g | PACK | Freq: Every day | ORAL | 0 refills | Status: DC

## 2022-01-12 MED ORDER — PROSOURCE PLUS PO LIQD: 30.0000 mL | Freq: Two times a day (BID) | ORAL | 0 refills | Status: DC

## 2022-01-12 MED ORDER — POTASSIUM CHLORIDE CRYS ER 20 MEQ PO TBCR
30.0000 meq | EXTENDED_RELEASE_TABLET | ORAL | Status: DC
  Filled 2022-01-12: qty 1

## 2022-01-12 MED ORDER — SENNOSIDES-DOCUSATE SODIUM 8.6-50 MG PO TABS: 1.0000 | ORAL_TABLET | Freq: Every evening | ORAL | 0 refills | Status: DC | PRN

## 2022-01-12 NOTE — Progress Notes (Signed)
Mobility Specialist Progress Note    01/12/22 1452  Mobility  Activity Ambulated independently in hallway  Level of Assistance Modified independent, requires aide device or extra time  Assistive Device Four wheel walker  Distance Ambulated (ft) 350 ft  Activity Response Tolerated well  Mobility Referral Yes  $Mobility charge 1 Mobility   Pt received in room and agreeable. No complaints on walk. Returned to room with call bell in reach.    [place] Mobility Specialist  Please Neurosurgeon or Rehab Office at [phone]

## 2022-01-12 NOTE — Discharge Instructions (Signed)

## 2022-01-12 NOTE — Progress Notes (Signed)
Physical Therapy Treatment Patient Details Name: Alexa Buchanan MRN: [id number] DOB: Aug 22, 1950 Today's Date: 01/12/2022   History of Present Illness Pt is a 71 y/o F presenting to ED on 01/07/22 after increased Calcium levels (17) found after lab work at PCP. Admitted for hypercalcemia and AKI. PMH includes DM2, primary biliary cirrhosis, IBS, arthritis, back pain.    PT Comments    Pt received seated EOB, having just worked with mobility specialist and reports no concerns as far as longer distance gait training or stair training. Offered to have to ascend/descend step in room as she is anticipating impending DC but she defers. Pt modI for gait in room to bathroom without external assist needed and rollator adjusted for proper fit as new device had been delivered. Pt anxious to DC home now that IV is completed, NT/RN notified. Pt has met goals (other than stair training) and anticipate supervising PT may DC acute PT once pt performs stair trial or discharges, pt likely to discharge this date.  Recommendations for follow up therapy are one component of a multi-disciplinary discharge planning process, led by the attending physician.  Recommendations may be updated based on patient status, additional functional criteria and insurance authorization.  Follow Up Recommendations  Home health PT (pt may not need based on progress, defer to pt/MD decision on whether to continue as she is leaving today)     Assistance Recommended at Discharge Set up Supervision/Assistance  Patient can return home with the following A little help with walking and/or transfers;A little help with bathing/dressing/bathroom;Assist for transportation;Help with stairs or ramp for entrance;Assistance with cooking/housework   Equipment Recommendations  None recommended by PT    Recommendations for Other Services       Precautions / Restrictions Precautions Precautions: Fall;Other (comment) Precaution Comments: x2-3  falls in the past month Restrictions Weight Bearing Restrictions: No     Mobility  Bed Mobility               General bed mobility comments: received seated EOB    Transfers Overall transfer level: Needs assistance Equipment used: Rollator (4 wheels) Transfers: Sit to/from Stand Sit to Stand: Modified independent (Device/Increase time)           General transfer comment: from EOB and to/from low toilet unassisted    Ambulation/Gait Ambulation/Gait assistance: Modified independent (Device/Increase time) Gait Distance (Feet): 40 Feet (20 x2 in room, pt refusing hallway) Assistive device: Rollator (4 wheels) Gait Pattern/deviations: Step-through pattern Gait velocity: Fair, ~0.4 m/s or greater; limited distance     General Gait Details: able to negotiate rollator around obstacles in room without LOB, pt did not need to use it inside of bathroom so PTA using that time to adjust handle height to proper fit; good gait speed/cadence, no LOB or symptoms distress.   Stairs Stairs:  (pt adamantly refusing, reporting no concerns; offered 7" step in room pt defers awaiting DC)           Wheelchair Mobility    Modified Rankin (Stroke Patients Only)       Balance Overall balance assessment: No apparent balance deficits (not formally assessed) Sitting-balance support: No upper extremity supported Sitting balance-Leahy Scale: Good     Standing balance support: Single extremity supported, Bilateral upper extremity supported, During functional activity, No upper extremity supported Standing balance-Leahy Scale: Good Standing balance comment: no loss of balance or concerns with and without AD for short distances in room; limited assessment as pt preparing for DC  Cognition Arousal/Alertness: Awake/alert Behavior During Therapy: WFL for tasks assessed/performed Overall Cognitive Status: Within Functional Limits for tasks  assessed                                 General Comments: eager to discharge, good recall of instruction for safe use of 4WW from previous sessions        Exercises      General Comments General comments (skin integrity, edema, etc.): VSS per chart review, pt denies s/sx distress, tele not connected during session due  to imminent DC      Pertinent Vitals/Pain Pain Assessment Pain Assessment: No/denies pain Faces Pain Scale: No hurt    Home Living Family/patient expects to be discharged to:: Private residence Living Arrangements: Spouse/significant other                      Prior Function            PT Goals (current goals can now be found in the care plan section) Acute Rehab PT Goals Patient Stated Goal: to feel better soon PT Goal Formulation: With patient Time For Goal Achievement: 01/23/22 Progress towards PT goals: Progressing toward goals;Goals met/education completed, patient discharged from PT (supervising PT notified goals met other than stairs and pt refusing stair training)    Frequency    Min 3X/week      PT Plan Current plan remains appropriate       AM-PAC PT "6 Clicks" Mobility   Outcome Measure  Help needed turning from your back to your side while in a flat bed without using bedrails?: None Help needed moving from lying on your back to sitting on the side of a flat bed without using bedrails?: None Help needed moving to and from a bed to a chair (including a wheelchair)?: None Help needed standing up from a chair using your arms (e.g., wheelchair or bedside chair)?: None Help needed to walk in hospital room?: None Help needed climbing 3-5 steps with a railing? : A Little 6 Click Score: 23    End of Session Equipment Utilized During Treatment:  (pt defers gait belt) Activity Tolerance: Other (comment) (pt preparing for DC, recently worked with mobility specialist so defers hallway distances/stair training) Patient  left: with call bell/phone within reach;with family/visitor present;Other (comment) (pt sitting EOB, spouse in room) Nurse Communication: Mobility status;Other (comment) (pt wants IV out) PT Visit Diagnosis: Other abnormalities of gait and mobility (R26.89);Muscle weakness (generalized) (M62.81);Unsteadiness on feet (R26.81)     Time: 4536-4680 PT Time Calculation (min) (ACUTE ONLY): 8 min  Charges:  $Therapeutic Activity: 8-22 mins                     Carly P., PTA Acute Rehabilitation Services Secure Chat Preferred 9a-5:30pm Office: [place] 01/12/2022, 3:23 PM

## 2022-01-12 NOTE — TOC Progression Note (Signed)
Transition of Care [facility]) - Progression Note    Patient Details  Name: Alexa Buchanan MRN: [id number] Date of Birth: [birth year]  Transition of Care [facility]) CM/SW Contact  Nadene Rubins Adria Devon, RN Phone Number: 01/12/2022, 11:11 AM  Clinical Narrative:     [name] with [name] aware discharge is today.   Rollator was delivered to room. 3 in 1 to home   Expected Discharge Plan: Home w Home Health Services Barriers to Discharge: Continued Medical Work up  Expected Discharge Plan and Services Expected Discharge Plan: Home w Home Health Services In-house Referral: NA Discharge Planning Services: CM Consult Post Acute Care Choice: Home Health Living arrangements for the past 2 months: Single Family Home                 DME Arranged: Walker rolling with seat DME Agency: [employer] Date DME Agency Contacted: 01/09/22 Time DME Agency Contacted: [phone] Representative spoke with at DME Agency: [employer] HH Arranged: PT, OT HH Agency: [facility] Date [facility] Agency Contacted: 01/09/22 Time HH Agency Contacted: [id number] Representative spoke with at [facility] Agency: [name]   Social Determinants of Health (SDOH) Interventions    Readmission Risk Interventions     No data to display

## 2022-01-12 NOTE — Plan of Care (Signed)

## 2022-01-12 NOTE — Discharge Summary (Addendum)
Physician Discharge Summary  Alexa Buchanan YQM:[id number] DOB: 15-Mar-1950  PCP: Alysia Penna, MD  Admitted from: Home Discharged to: Home  Admit date: 01/07/2022 Discharge date: 01/12/2022  Recommendations for Outpatient Follow-up:    Follow-up Information     [facility] Follow up.   Specialty: Home Health Services Why: HHPT, HHOT- Agency will contact you to set up apt times Contact information: [address] [place] [address] [phone]         Alysia Penna, MD Follow up in 1 week(s).   Specialty: Internal Medicine Why: To be seen with repeat labs (CBC, CMP, magnesium and phosphorus) Contact information: [address] [place] [address] [phone]                  Home Health: Home Health Orders (From admission, onward)     Start     Ordered   01/12/22 0933  Home Health  At discharge       Question Answer Comment  To provide the following care/treatments PT   To provide the following care/treatments OT      01/12/22 0935             Equipment/Devices:     Durable Medical Equipment  (From admission, onward)           Start     Ordered   01/10/22 1055  For home use only DME Bedside commode  Once       Question:  Patient needs a bedside commode to treat with the following condition  Answer:  Weakness   01/10/22 1054   01/09/22 1653  For home use only DME 4 wheeled rolling walker with seat  Once       Question:  Patient needs a walker to treat with the following condition  Answer:  Weakness   01/09/22 1652             Discharge Condition: Improved and stable.   Code Status: Full Code Diet recommendation:  Discharge Diet Orders (From admission, onward)     Start     Ordered   01/12/22 0000  Diet - low sodium heart healthy        01/12/22 0935             Discharge Diagnoses:  Principal Problem:   Hypercalcemia Active Problems:   Uncontrolled type 2 diabetes mellitus with  hypoglycemia, with long-term current use of insulin (HCC)   AKI (acute kidney injury) (HCC)   Malnutrition of moderate degree   Brief Summary: 71 year old married female with PMH of type II DM, controlled, primary biliary cirrhosis, IBS, presented to the [facility] ED after routine lab work with PCP showed hypercalcemia (serum calcium 15 on admission).  She had malaise, nausea, difficulty gripping things and confusion.  Treating for hypercalcemia, work-up for etiology thus far negative, possibly due to home over supplementation of calcium and for AKI.  Slowly improving.  Possible DC home 11/16     Assessment & Plan:   Hypercalcemia: Presented with serum calcium >15.  Vitamin D, 25-hydroxy: 71.32 > 63.85.  Intact PTH and TSH WNL.  PTH RP and SPEP: Results pending and to be followed up as outpatient by PCP.  S/p aggressive IV fluid hydration, subcu calcitonin 11/11 - 11/12, zoledronic acid 4 mg IVPB x1 on 11/11.  Etiology unclear but may be related to excessive oral supplementation at home (MVI, calcium carbonate + vitamin D).  Patient and spouse feel that  her symptoms might have started approximately at the same time that she started taking Neutrufol supplements for her hair approximately 8 weeks ago.  Will have pharmacy check this for calcium ingredients.  Improving, serum calcium down to 8.1 (uncorrected.  Albumin 2.8).  Recommend close outpatient follow-up and hold off of multivitamins, calcium and vitamin D supplements until PCP follow-up with labs.  This was discussed in great detail with patient and spouse at bedside and they verbalized understanding.  They were also advised to not take any OTC supplements either old or new.  UPEP showed no M spike and the immunofixation pattern appears unremarkable.  Evidence of monoclonal protein was not apparent.  May consider outpatient endocrinology consultation as deemed necessary.   Acute kidney injury: Secondary to hypercalcemia, dehydration and  related prerenal etiology.  Treated hypercalcemia as above.  Aggressive IV fluid hydration.  Creatinine has improved from 3.48 on admission to 1.52.  Last known creatinine 0.8 on 08/01/2018.  Improving.  Since patient taking orally adequately, encouraged ad lib. oral fluid intake, avoid NSAIDs and other nephrotoxic medications with close outpatient follow-up with PCP with repeat labs.  Expect either her AKI will completely resolve or she may have progressed to some stage of CKD since last lab work in June 2020.  Her PCP may have most recent labs than the ones we have in 2020.   Type II DM, controlled, hypoglycemia: A1c 5.3 suggests tight DM control.  Not on insulins but on Ozempic at home, currently on hold.  Last hypoglycemic episode with CBG of 68 on 11/11.  DC SSI and monitor CBGs closely.  Discontinued Ozempic at discharge.  Close outpatient follow-up with PCP for further management.   Hypokalemia: Replacing potassium and magnesium aggressively prior to discharge.  Outpatient follow-up with labs.   Hypophosphatemia: Replacing aggressively IV prior to discharge and close outpatient follow-up with labs.   Essential hypertension: Not on antihypertensives PTA.  Started on amlodipine 5 Mg daily earlier this admission, continue at discharge.   Urinary incontinence: Continue oxybutynin, follows with urology outpatient.   Primary biliary cirrhosis: Stable.  Outpatient follow-up.  Continue prior home dose of ursodiol.   Normocytic anemia: Much of this may be dilutional from aggressive IV fluid hydration.  Outpatient follow-up.   Body mass index is 26.26 kg/m.     Consultants:   None   Procedures:       Discharge Instructions  Discharge Instructions     Call MD for:  difficulty breathing, headache or visual disturbances   Complete by: As directed    Call MD for:  extreme fatigue   Complete by: As directed    Call MD for:  persistant dizziness or light-headedness   Complete by: As  directed    Call MD for:  persistant nausea and vomiting   Complete by: As directed    Call MD for:  severe uncontrolled pain   Complete by: As directed    Call MD for:  temperature >100.4   Complete by: As directed    Diet - low sodium heart healthy   Complete by: As directed    Increase activity slowly   Complete by: As directed         Medication List     STOP taking these medications    CALCIUM + D PO   multivitamin capsule   Ozempic (0.25 or 0.5 MG/DOSE) 2 MG/1.5ML Sopn Generic drug: Semaglutide(0.25 or 0.5MG /DOS)   Vitamin D3 50 MCG (2000 UT) Tabs  TAKE these medications    (feeding supplement) PROSource Plus liquid Take 30 mLs by mouth 2 (two) times daily between meals.   amLODipine 5 MG tablet Commonly known as: NORVASC Take 1 tablet (5 mg total) by mouth daily.   aspirin EC 81 MG tablet Take 81 mg by mouth daily.   cetirizine 10 MG tablet Commonly known as: ZYRTEC Take 10 mg by mouth daily.   glucose blood test strip Commonly known as: ONE TOUCH ULTRA TEST CHECK BLOOD SUGAR TWICE A DAY AS DIRECTED.   HYDROcodone-acetaminophen 5-325 MG tablet Commonly known as: NORCO/VICODIN Take 1 tablet by mouth every 6 (six) hours as needed.   OneTouch Delica Plus Lancet33G Misc Apply topically 4 (four) times daily.   oxybutynin 15 MG 24 hr tablet Commonly known as: DITROPAN XL TAKE 1 TABLET DAILY.   polyethylene glycol 17 g packet Commonly known as: MIRALAX / GLYCOLAX Take 17 g by mouth daily. Start taking on: January 13, 2022   senna-docusate 8.6-50 MG tablet Commonly known as: Senokot-S Take 1 tablet by mouth at bedtime as needed for mild constipation or moderate constipation.   ursodiol 300 MG capsule Commonly known as: ACTIGALL Take one capsule (300 mg) by mouth in the morning and two capsules (600 mg) by mouth in the evening What changed: See the new instructions.   vitamin C 100 MG tablet Take 100 mg by mouth daily.   zolpidem 10  MG tablet Commonly known as: AMBIEN Take 5 mg by mouth as needed for sleep.       Allergies  Allergen Reactions   Empagliflozin     Other reaction(s): Other (See Comments), recurrent vaginal yeast infections Yeast infection       Procedures/Studies:   Subjective: Spouse by bedside.  Wants to know when she can go home today.  States that she feels back to her baseline.  Denies weakness or any other complaints.  Still wondering how she developed acute kidney injury and hypercalcemia.  Discharge Exam:  Vitals:   01/11/22 1514 01/11/22 1917 01/12/22 0408 01/12/22 0745  BP: (!) 143/57 (!) 156/66 (!) 143/54 (!) 166/43  Pulse: 71 66 69 64  Resp:  20 17   Temp: 97.6 F (36.4 C) 97.9 F (36.6 C) 98.4 F (36.9 C) 97.8 F (36.6 C)  TempSrc: Oral Oral Oral Oral  SpO2:  95% 95%   Weight:   68 kg   Height:        General exam: Elderly female, moderately built and thinly nourished lying comfortably propped up in bed without distress.  Oral mucosa moist. Respiratory system: Clear to auscultation.  No increased work of breathing. Cardiovascular system: S1 & S2 heard, RRR. No JVD, murmurs, rubs, gallops or clicks. No pedal edema.  Telemetry was discontinued yesterday. Gastrointestinal system: Abdomen is nondistended, soft and nontender. No organomegaly or masses felt. Normal bowel sounds heard. Central nervous system: Alert and oriented. No focal neurological deficits. Extremities: Symmetric 5 x 5 power. Skin: No rashes, lesions or ulcers Psychiatry: Judgement and insight appear normal. Mood & affect appropriate.     The results of significant diagnostics from this hospitalization (including imaging, microbiology, ancillary and laboratory) are listed below for reference.     Microbiology: Recent Results (from the past 240 hour(s))  MRSA Next Gen by PCR, Nasal     Status: None   Collection Time: 01/07/22  7:14 PM   Specimen: Nasal Mucosa; Nasal Swab  Result Value Ref Range  Status   MRSA by PCR  Next Gen NOT DETECTED NOT DETECTED Final    Comment: (NOTE) The GeneXpert MRSA Assay (FDA approved for NASAL specimens only), is one component of a comprehensive MRSA colonization surveillance program. It is not intended to diagnose MRSA infection nor to guide or monitor treatment for MRSA infections. Test performance is not FDA approved in patients less than 36 years old. Performed at [facility], [address] [place], [place] [address]      Labs: CBC: Recent Labs  Lab 01/07/22 0829 01/07/22 1855 01/08/22 0010 01/09/22 0021  WBC 8.8 9.3 9.4 7.1  NEUTROABS 5.8  --   --   --   HGB 13.1 11.9* 11.2* 10.7*  HCT 38.6 33.8* 31.7* 32.5*  MCV 92.8 91.8 91.9 95.3  PLT 279 258 229 190    Basic Metabolic Panel: Recent Labs  Lab 01/07/22 0829 01/07/22 1855 01/08/22 0010 01/09/22 0021 01/09/22 0903 01/10/22 0621 01/11/22 0638 01/11/22 0639 01/12/22 0026  NA 138   < > 139 140  --  138  --  140 143  K 4.0   < > 3.7 3.5  --  3.2*  --  3.4* 3.4*  CL 99   < > 106 113*  --  105  --  108 110  CO2 31   < > 28 20*  --  23  --  23 24  GLUCOSE 94   < > 89 95  --  94  --  95 102*  BUN 40*   < > 33* 23  --  20  --  14 11  CREATININE 3.48*   < > 3.14* 2.71*  --  2.20*  --  1.75* 1.52*  CALCIUM >15.0*   < > 12.3* 10.0 9.4 8.7*  --  8.5* 8.1*  MG 2.6*  --   --   --   --   --  1.6*  --   --   PHOS  --   --   --   --   --   --   --  1.6* 1.8*   < > = values in this interval not displayed.    Liver Function Tests: Recent Labs  Lab 01/07/22 0829 01/07/22 1855 01/08/22 0010 01/09/22 0021 01/11/22 0639 01/12/22 0026  AST 16 23 20 25   --   --   ALT 15 16 15 18   --   --   ALKPHOS 79 60 63 57  --   --   BILITOT 0.6 0.6 0.4 0.3  --   --   PROT 6.6 5.5* 5.0* 5.1*  --   --   ALBUMIN 3.9 3.1* 2.9* 2.8* 2.7* 2.8*    CBG: Recent Labs  Lab 01/11/22 0602 01/11/22 1121 01/11/22 1608 01/11/22 2128 01/12/22 0623  GLUCAP 99 139* 112* 101* 101*      Urinalysis    Component Value Date/Time   COLORURINE YELLOW 01/07/2022 1410   APPEARANCEUR CLEAR 01/07/2022 1410   LABSPEC 1.007 01/07/2022 1410   PHURINE 7.0 01/07/2022 1410   GLUCOSEU NEGATIVE 01/07/2022 1410   HGBUR NEGATIVE 01/07/2022 1410   HGBUR large 04/21/2010 1144   BILIRUBINUR NEGATIVE 01/07/2022 1410   BILIRUBINUR small 12/29/2010 1553   KETONESUR NEGATIVE 01/07/2022 1410   PROTEINUR NEGATIVE 01/07/2022 1410   UROBILINOGEN 0.2 10/16/2013 0955   NITRITE NEGATIVE 01/07/2022 1410   LEUKOCYTESUR SMALL (A) 01/07/2022 1410    Discussed in detail with patient's spouse at bedside, updated care and answered all questions.  Addendum: Following my  visit this morning, RN messaged indicating that patient was complaining of some tingling in her feet.  Went by later during midday and evaluated her along with her RN and spouse at bedside.  She indicated that after ambulating some, her tingling in her feet and some she had in her hands had improved.  She reports having intermittent problems like this in the past.  Explained to her that this may be related to some of her electrolyte abnormalities which were being corrected. No focal deficits noted.  Time coordinating discharge: 45 minutes  SIGNED:  Marcellus Scott, MD,  FACP, FHM, [facility], [facility], [facility]   [facility] Phys[facility] Advisor [place]     To contact the attending provider between 7A-7P or the covering provider during after hours 7P-7A, please log into the web site [email or web address] and access using universal [place] password for that web site. If you do not have the password, please call the hospital operator.

## 2022-01-12 NOTE — Telephone Encounter (Signed)
Patient husband states that she has been in the hospital for the last week and had high calcium. The results are in her my chart and the husband wants to no if Dr whitfield would take a look at  the results and advise. [phone]

## 2022-01-13 DIAGNOSIS — D649 Anemia, unspecified: Secondary | ICD-10-CM | POA: Diagnosis not present

## 2022-01-13 DIAGNOSIS — E440 Moderate protein-calorie malnutrition: Secondary | ICD-10-CM | POA: Diagnosis not present

## 2022-01-13 DIAGNOSIS — R32 Unspecified urinary incontinence: Secondary | ICD-10-CM | POA: Diagnosis not present

## 2022-01-13 DIAGNOSIS — N179 Acute kidney failure, unspecified: Secondary | ICD-10-CM | POA: Diagnosis not present

## 2022-01-13 DIAGNOSIS — E860 Dehydration: Secondary | ICD-10-CM | POA: Diagnosis not present

## 2022-01-13 DIAGNOSIS — E11649 Type 2 diabetes mellitus with hypoglycemia without coma: Secondary | ICD-10-CM | POA: Diagnosis not present

## 2022-01-13 DIAGNOSIS — Z9181 History of falling: Secondary | ICD-10-CM | POA: Diagnosis not present

## 2022-01-13 DIAGNOSIS — K743 Primary biliary cirrhosis: Secondary | ICD-10-CM | POA: Diagnosis not present

## 2022-01-13 DIAGNOSIS — E876 Hypokalemia: Secondary | ICD-10-CM | POA: Diagnosis not present

## 2022-01-13 DIAGNOSIS — Z7982 Long term (current) use of aspirin: Secondary | ICD-10-CM | POA: Diagnosis not present

## 2022-01-13 LAB — PTH-RELATED PEPTIDE: PTH-related peptide: <2 pmol/L

## 2022-01-17 DIAGNOSIS — K589 Irritable bowel syndrome without diarrhea: Secondary | ICD-10-CM | POA: Diagnosis not present

## 2022-01-17 DIAGNOSIS — N179 Acute kidney failure, unspecified: Secondary | ICD-10-CM | POA: Diagnosis not present

## 2022-01-17 DIAGNOSIS — R29818 Other symptoms and signs involving the nervous system: Secondary | ICD-10-CM | POA: Diagnosis not present

## 2022-01-17 NOTE — Telephone Encounter (Signed)
called

## 2022-01-24 ENCOUNTER — Telehealth: Payer: Self-pay | Admitting: Physician Assistant

## 2022-01-24 NOTE — Telephone Encounter (Signed)
Scheduled appointment per referral. Patient is aware of appointment date and time. Patient is aware to arrive 15 mins prior to appointment time and to bring updated insurance cards. Patient is aware of location.   

## 2022-01-26 NOTE — Progress Notes (Signed)
[place] [phone]   [phone] [place] NOTE  Patient Care Team: Velna Hatchet, MD as PCP - General (Internal Medicine)  Hematological/Oncological History -01/07/2022-01/12/2022: Hospitalized for hypercalcemia and AKI.  -01/07/2022: SPEP was unremarkable for monoclonal protein.  -01/08/2022: UPEP was unremarkable for monoclonal protein.   CHIEF COMPLAINTS/PURPOSE OF CONSULTATION:  Hypercalcemia/AKI  HISTORY OF PRESENTING ILLNESS:  Alexa Buchanan 71 y.o. female with medical history significant for T2DM, IBS, OSA, arthritis and diverticulosis presents to the hematology clinic for evaluation of paraproteinemia. She is accompanied by her husband for this visit.   On review of the previous records, Ms. Tinner was hospitalized for hyperclacemia and AKI from 01/07/2022-01/12/2022 for hypercalcemia and AKI. Underlying cause was felt to be related to excessive oral supplementation. She was advised to discontinue all her OTC multivitamins, calcium and vitamin D supplements. She underwent workup for paraproteinemia by checking UPEP and SPEP which did not detect a monoclonal protein.   On exam today, Alexa Buchanan reports her energy levels are improving since hospital discharge. Her balance has improved where she can ambulate on her own. She denies any syncopal episodes or falls since discharge. She is trying to stay hydrated and drinking lots of water. As an effect of increased oral intake, she has increased urinary frequency. She struggles with insomnia at night which has been persistent. She denies fevers, chills, drenching sweats, shortness of breath, chest pain or cough. She has no other complaints. Rest of the 10 point ROS is below.   MEDICAL HISTORY:  Past Medical History:  Diagnosis Date   Arthritis    Complication of anesthesia    woke up to soon.  reported "bad gag reflex"      Diabetes mellitus    Type 2   Diverticulosis     Dysmetabolic syndrome X    Histoplasmosis 1971   IBS (irritable bowel syndrome)    Liver disease    PBC   Migraines    ice pick headaches, Dr. Beacher May   OSA (obstructive sleep apnea) 07/27/2011   Home sleep study 08/18/11 >> AHI 8.1, SpO2 low 74%.  pt stated she does not have sleep apnes   Seasonal allergies    UTI symptoms     SURGICAL HISTORY: Past Surgical History:  Procedure Laterality Date   BREAST EXCISIONAL BIOPSY Right    BREAST LUMPECTOMY WITH RADIOACTIVE SEED LOCALIZATION Right 07/22/2015   Procedure: BREAST LUMPECTOMY WITH RADIOACTIVE SEED LOCALIZATION;  Surgeon: Coralie Keens, MD;  Location: [facility];  Service: General;  Laterality: Right;   COLONOSCOPY     Tics; Dr Olevia Perches   DILATION AND CURETTAGE OF UTERUS     Gastric Plication  5456   KNEE ARTHROSCOPY Left 1990   LASER ABLATION OF VASCULAR LESION  2009   Dr. Thornell Mule, wart removed from nose   RECTOCELE REPAIR  2003   with vaginal wall repair and sling   ROTATOR CUFF REPAIR Right 2016   TONSILLECTOMY     1982/1992   VAGINAL HYSTERECTOMY  2003   Vag Hyst,BSO,Sling, posterior repair   VULVA SURGERY     keratoses   WRIST SURGERY Left 1988    tendon repair    SOCIAL HISTORY: Social History   Socioeconomic History   Marital status: Married    Spouse name: Not on file   Number of children: 2   Years of education: Not on file   Highest education level: Not on file  Occupational History   Occupation: Building surveyor  Tobacco Use   Smoking status: Every Day    Packs/day: 0.50    Years: 30.00    Total pack years: 15.00    Types: Cigarettes   Smokeless tobacco: Never   Tobacco comments:    Smoked 1968-present; up to 1 ppd  Vaping Use   Vaping Use: Former  Substance and Sexual Activity   Alcohol use: No   Drug use: No   Sexual activity: Not Currently    Birth control/protection: Surgical    Comment: 1st intercourse 71 yo-Fewer than 5 partners  Other Topics Concern   Not on file  Social History Narrative    Married, lives with spouse [name]   2 children - her son lives in [place] and daughter lives in [place]: owns a Environmental consultant, 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[employer])   Social Determinants of Radio broadcast assistant Strain: Not on Art therapist Insecurity: Not on file  Transportation Needs: Not on file  Physical Activity: Not on file  Stress: Not on file  Social Connections: Not on file  Intimate Partner Violence: Not on file    FAMILY HISTORY: Family History  Problem Relation Age of Onset   Leukemia Father        CLL   Hyperlipidemia Mother    Multiple sclerosis Mother    Hypertension Mother    Diabetes Maternal Grandmother        also had malignant lower neck mass of unknown etiology   Breast cancer Sister        age 55   Hypertension Sister        related to migraines   Migraines Sister    Stroke Maternal Grandfather 59   Heart disease Maternal Aunt        porcine valve   Heart disease Maternal Uncle        valvular disease   Colon cancer Neg Hx    Stomach cancer Neg Hx    Pancreatic cancer Neg Hx    Esophageal cancer Neg Hx     ALLERGIES:  is allergic to empagliflozin.  MEDICATIONS:  Current Outpatient Medications  Medication Sig Dispense Refill   amLODipine (NORVASC) 5 MG tablet Take 1 tablet (5 mg total) by mouth daily. 30 tablet 1   Ascorbic Acid (VITAMIN C) 100 MG tablet Take 100 mg by mouth daily.     aspirin EC 81 MG tablet Take 81 mg by mouth daily.     cetirizine (ZYRTEC) 10 MG tablet Take 10 mg by mouth daily.     glucose blood (ONE TOUCH ULTRA TEST) test strip CHECK BLOOD SUGAR TWICE A DAY AS DIRECTED. 50 each 6   HYDROcodone-acetaminophen (NORCO/VICODIN) 5-325 MG tablet Take 1 tablet by mouth every 6 (six) hours as needed.     Lancets (ONETOUCH DELICA PLUS OINOMV67M) MISC Apply topically 4 (four) times daily.     Nutritional Supplements (,FEEDING SUPPLEMENT, PROSOURCE PLUS) liquid Take 30 mLs by mouth 2 (two) times daily between  meals. 887 mL 0   oxybutynin (DITROPAN XL) 15 MG 24 hr tablet TAKE 1 TABLET DAILY.     polyethylene glycol (MIRALAX / GLYCOLAX) 17 g packet Take 17 g by mouth daily. 14 each 0   senna-docusate (SENOKOT-S) 8.6-50 MG tablet Take 1 tablet by mouth at bedtime as needed for mild constipation or moderate constipation. 30 tablet 0   ursodiol (ACTIGALL) 300 MG capsule Take one capsule (300 mg) by mouth in the morning  and two capsules (600 mg) by mouth in the evening     zolpidem (AMBIEN) 10 MG tablet Take 5 mg by mouth as needed for sleep.      No current facility-administered medications for this visit.    REVIEW OF SYSTEMS:   Constitutional: ( - ) fevers, ( - )  chills , ( - ) night sweats Eyes: ( - ) blurriness of vision, ( - ) double vision, ( - ) watery eyes Ears, nose, mouth, throat, and face: ( - ) mucositis, ( - ) sore throat Respiratory: ( - ) cough, ( - ) dyspnea, ( - ) wheezes Cardiovascular: ( - ) palpitation, ( - ) chest discomfort, ( - ) lower extremity swelling Gastrointestinal:  ( - ) nausea, ( - ) heartburn, ( - ) change in bowel habits Skin: ( - ) abnormal skin rashes Lymphatics: ( - ) new lymphadenopathy, ( - ) easy bruising Neurological: ( - ) numbness, ( - ) tingling, ( - ) new weaknesses Behavioral/Psych: ( - ) mood change, ( - ) new changes  All other systems were reviewed with the patient and are negative.  PHYSICAL EXAMINATION: ECOG PERFORMANCE STATUS: 1 - Symptomatic but completely ambulatory  There were no vitals filed for this visit. There were no vitals filed for this visit.  GENERAL: well appearing female in NAD  SKIN: skin color, texture, turgor are normal, no rashes or significant lesions EYES: conjunctiva are pink and non-injected, sclera clear OROPHARYNX: no exudate, no erythema; lips, buccal mucosa, and tongue normal  NECK: supple, non-tender LYMPH:  no palpable lymphadenopathy in the cervical or supraclavicular lymph nodes.  LUNGS: clear to auscultation  and percussion with normal breathing effort HEART: regular rate & rhythm and no murmurs and no lower extremity edema ABDOMEN: soft, non-tender, non-distended, normal bowel sounds Musculoskeletal: no cyanosis of digits and no clubbing  PSYCH: alert & oriented x 3, fluent speech NEURO: no focal motor/sensory deficits  LABORATORY DATA:  I have reviewed the data as listed    Latest Ref Rng & Units 01/09/2022   12:21 AM 01/08/2022   12:10 AM 01/07/2022    6:55 PM  CBC  WBC 4.0 - 10.5 K/uL 7.1  9.4  9.3   Hemoglobin 12.0 - 15.0 g/dL 10.7  11.2  11.9   Hematocrit 36.0 - 46.0 % 32.5  31.7  33.8   Platelets 150 - 400 K/uL 190  229  258        Latest Ref Rng & Units 01/12/2022   12:26 AM 01/11/2022    6:39 AM 01/10/2022    6:21 AM  CMP  Glucose 70 - 99 mg/dL 102  95  94   BUN 8 - 23 mg/dL _0 Creatinine 0.44 - 1.00 mg/dL 1.52  1.75  2.20   Sodium 135 - 145 mmol/L 143  140  138   Potassium 3.5 - 5.1 mmol/L 3.4  3.4  3.2   Chloride 98 - 111 mmol/L 110  108  105   CO2 22 - 32 mmol/L _1 Calcium 8.9 - 10.3 mg/dL 8.1  8.5  8.7     RADIOGRAPHIC STUDIES: I have personally reviewed the radiological images as listed and agreed with the findings in the report. MR Lumbar Spine w/o contrast  Result Date: 01/03/2022 CLINICAL DATA:  Lower back pain radiating into the bilateral buttocks, leg weakness and balance issues. EXAM: MRI LUMBAR SPINE WITHOUT CONTRAST TECHNIQUE:  Multiplanar, multisequence MR imaging of the lumbar spine was performed. No intravenous contrast was administered. COMPARISON:  Lumbar spine MRI 08/26/2011 FINDINGS: Segmentation: Standard; the lowest formed disc space is designated L5-S1. Alignment: There is trace retrolisthesis of L1 on L2. Alignment is otherwise normal. Vertebrae: Vertebral body heights are preserved. Background marrow signal is normal. There is no suspicious marrow signal abnormality. There is mild degenerative/reactive marrow edema in the  bilateral L5 posterior elements, left worse than right. Conus medullaris and cauda equina: Conus extends to the L1 level. Conus and cauda equina appear normal. Paraspinal and other soft tissues: There is presacral edema, nonspecific (4-10). There is mild perifacetal soft tissue edema bilaterally at L3-L4 and L4-L5. Disc levels: There is overall multilevel mild-to-moderate disc desiccation and narrowing throughout the lumbar spine. T12-L1: No significant spinal canal or neural foraminal stenosis L1-L2: There is a shallow central protrusion and mild facet arthropathy without significant spinal canal or neural foraminal stenosis. L2-L3: There is a mild disc bulge eccentric to the right and mild-to-moderate bilateral facet arthropathy with ligamentum flavum thickening but no significant spinal canal or neural foraminal stenosis L3-L4: There is a diffuse disc bulge and mild-to-moderate bilateral facet arthropathy with ligamentum flavum thickening resulting in mild spinal canal and bilateral subarticular zone narrowing without evidence of nerve root impingement, and mild-to-moderate left worse than right neural foraminal stenosis. L4-L5: There is a diffuse disc bulge eccentric to the left and advanced left worse than right facet arthropathy with ligamentum flavum thickening resulting in mild-to-moderate spinal canal stenosis with left worse than right subarticular zone narrowing and moderate left and mild right neural foraminal stenosis L5-S1: There is a shallow broad-based disc protrusion and moderate left worse than right facet arthropathy resulting in moderate left and mild right neural foraminal stenosis without significant spinal canal stenosis Overall, the above findings are progressed since 2013. IMPRESSION: 1. Disc bulge and advanced left worse than right facet arthropathy at L4-L5 result in mild-to-moderate spinal canal stenosis with left worse than right subarticular zone narrowing and moderate left and mild  right neural foraminal stenosis. 2. Moderate left worse than right facet arthropathy at L5-S1 resulting in moderate left and mild right neural foraminal stenosis. 3. Mild spinal canal and bilateral subarticular zone narrowing and mild-to-moderate left worse than right neural foraminal stenosis at L3-L4. 4. Mild bilateral perifacetal soft tissue edema at L3-L4 and L4-L5, and degenerative/reactive marrow edema in the L5 posterior elements, left worse than right. Findings may reflect a source of pain. 5. Nonspecific presacral edema. Electronically Signed   By: Valetta Mole M.D.   On: 01/03/2022 08:55    ASSESSMENT & PLAN CHARLEIGH CORRENTI is a 71 y.o. female who presents to the hematology clinic for evaluate for paraproteinemia as underlying cause of hypercalcemia and AKI. We reviewed previous testing which included SPEP and UPEP, both were negative for monoclonal protein. Elevated free lights without monoclonal protein seen in UPEP is likely secondary to renal dysfunction. We recommend for patient to repeat labs today to check CBC, CMP, serum free light chains and SPEP with immunofixation. If there is no evidence of monoclonal protein identified, no further hematological workup is required.   #H/O Hypercalcemia and AKI: --Suspect secondary to excessive oral supplementation, patient has discontinued all her OTC multivitamin/vitamin d/calcium supplements. Creatinine and calcium levels markedly improved by hospital discharge on 01/12/2022.  --SPEP and UPEP performed during hospitalization were negative for monoclonal protein --Labs today to check CBC, CMP, SPEP/IFE and sFLC. --Only consider bone marrow biopsy if  there is monoclonal protein detected.  --Reviewed recently obtained MR lumbar spine and CT chest without evidence of suspicious malignant lesions/bone lesions.  --Patient plans to have consultation with endocrinology next.  --RTC based on above workup.   No orders of the defined types were placed in  this encounter.   All questions were answered. The patient knows to call the clinic with any problems, questions or concerns.  I have spent a total of 60 minutes minutes of face-to-face and non-face-to-face time, preparing to see the patient, obtaining and/or reviewing separately obtained history, performing a medically appropriate examination, counseling and educating the patient, ordering tests/procedures, documenting clinical information in the electronic health record, and care coordination.   Dede Query, PA-C Department of Hematology/Oncology [place] at [facility] Phone: [phone]  Patient was seen with Dr. Lorenso Courier  I have read the above note and personally examined the patient. I agree with the assessment and plan as noted above.  Briefly Alexa Buchanan is a 71 year old female who presents for evaluation of hypercalcemia.  During her hospitalization she had SPEP drawn which did not show any evidence of a monoclonal protein.  Additionally imaging has not shown any evidence of sclerotic lesions.  Today we will conduct a full monoclonal gammopathy workup to include SPEP, UPEP, and serum free light chains.  At this time there is no evidence that her hypercalcemia is being caused by neoplastic process.  If our workup is negative would recommend referral to endocrinology for further evaluation.  The patient voiced understanding of the plan moving forward.   Ledell Peoples, MD Department of Hematology/Oncology [place] at [facility] Phone: [phone] Pager: [phone] Email: [email or web address]

## 2022-01-27 ENCOUNTER — Inpatient Hospital Stay: Payer: Medicare Other

## 2022-01-27 ENCOUNTER — Inpatient Hospital Stay: Payer: Medicare Other | Attending: Physician Assistant | Admitting: Physician Assistant

## 2022-01-27 ENCOUNTER — Encounter: Payer: Self-pay | Admitting: Physician Assistant

## 2022-01-27 DIAGNOSIS — Z803 Family history of malignant neoplasm of breast: Secondary | ICD-10-CM | POA: Diagnosis not present

## 2022-01-27 DIAGNOSIS — Z7982 Long term (current) use of aspirin: Secondary | ICD-10-CM | POA: Diagnosis not present

## 2022-01-27 DIAGNOSIS — M129 Arthropathy, unspecified: Secondary | ICD-10-CM | POA: Diagnosis not present

## 2022-01-27 DIAGNOSIS — E119 Type 2 diabetes mellitus without complications: Secondary | ICD-10-CM | POA: Insufficient documentation

## 2022-01-27 DIAGNOSIS — Z79899 Other long term (current) drug therapy: Secondary | ICD-10-CM | POA: Insufficient documentation

## 2022-01-27 DIAGNOSIS — K589 Irritable bowel syndrome without diarrhea: Secondary | ICD-10-CM | POA: Diagnosis not present

## 2022-01-27 DIAGNOSIS — G4730 Sleep apnea, unspecified: Secondary | ICD-10-CM | POA: Insufficient documentation

## 2022-01-27 DIAGNOSIS — F17210 Nicotine dependence, cigarettes, uncomplicated: Secondary | ICD-10-CM | POA: Diagnosis not present

## 2022-01-27 DIAGNOSIS — Z806 Family history of leukemia: Secondary | ICD-10-CM | POA: Insufficient documentation

## 2022-01-27 LAB — CBC WITH DIFFERENTIAL (CANCER CENTER ONLY)
Abs Immature Granulocytes: 0.01 10*3/uL (ref 0.00–0.07)
Basophils Absolute: 0.1 10*3/uL (ref 0.0–0.1)
Basophils Relative: 1 %
Eosinophils Absolute: 0.1 10*3/uL (ref 0.0–0.5)
Eosinophils Relative: 1 %
HCT: 35.8 % — ABNORMAL LOW (ref 36.0–46.0)
Hemoglobin: 12.2 g/dL (ref 12.0–15.0)
Immature Granulocytes: 0 %
Lymphocytes Relative: 42 %
Lymphs Abs: 3.3 10*3/uL (ref 0.7–4.0)
MCH: 31.9 pg (ref 26.0–34.0)
MCHC: 34.1 g/dL (ref 30.0–36.0)
MCV: 93.5 fL (ref 80.0–100.0)
Monocytes Absolute: 0.4 10*3/uL (ref 0.1–1.0)
Monocytes Relative: 5 %
Neutro Abs: 4 10*3/uL (ref 1.7–7.7)
Neutrophils Relative %: 51 %
Platelet Count: 334 10*3/uL (ref 150–400)
RBC: 3.83 MIL/uL — ABNORMAL LOW (ref 3.87–5.11)
RDW: 14.3 % (ref 11.5–15.5)
WBC Count: 7.9 10*3/uL (ref 4.0–10.5)
nRBC: 0 % (ref 0.0–0.2)

## 2022-01-27 LAB — CMP (CANCER CENTER ONLY)
ALT: 11 U/L (ref 0–44)
AST: 13 U/L — ABNORMAL LOW (ref 15–41)
Albumin: 4.3 g/dL (ref 3.5–5.0)
Alkaline Phosphatase: 84 U/L (ref 38–126)
Anion gap: 6 (ref 5–15)
BUN: 29 mg/dL — ABNORMAL HIGH (ref 8–23)
CO2: 28 mmol/L (ref 22–32)
Calcium: 10.8 mg/dL — ABNORMAL HIGH (ref 8.9–10.3)
Chloride: 106 mmol/L (ref 98–111)
Creatinine: 1.01 mg/dL — ABNORMAL HIGH (ref 0.44–1.00)
GFR, Estimated: 59 mL/min — ABNORMAL LOW (ref >60)
Glucose, Bld: 115 mg/dL — ABNORMAL HIGH (ref 70–99)
Potassium: 4.4 mmol/L (ref 3.5–5.1)
Sodium: 140 mmol/L (ref 135–145)
Total Bilirubin: 0.5 mg/dL (ref 0.3–1.2)
Total Protein: 7.5 g/dL (ref 6.5–8.1)

## 2022-01-30 LAB — KAPPA/LAMBDA LIGHT CHAINS
Kappa free light chain: 35.9 mg/L — ABNORMAL HIGH (ref 3.3–19.4)
Kappa, lambda light chain ratio: 1.96 — ABNORMAL HIGH (ref 0.26–1.65)
Lambda free light chains: 18.3 mg/L (ref 5.7–26.3)

## 2022-01-31 LAB — MULTIPLE MYELOMA PANEL, SERUM
Albumin SerPl Elph-Mcnc: 3.3 g/dL (ref 2.9–4.4)
Albumin/Glob SerPl: 1.2 (ref 0.7–1.7)
Alpha 1: 0.3 g/dL (ref 0.0–0.4)
Alpha2 Glob SerPl Elph-Mcnc: 0.8 g/dL (ref 0.4–1.0)
B-Globulin SerPl Elph-Mcnc: 1 g/dL (ref 0.7–1.3)
Gamma Glob SerPl Elph-Mcnc: 0.8 g/dL (ref 0.4–1.8)
Globulin, Total: 2.9 g/dL (ref 2.2–3.9)
IgA: 158 mg/dL (ref 64–422)
IgG (Immunoglobin G), Serum: 798 mg/dL (ref 586–1602)
IgM (Immunoglobulin M), Srm: 118 mg/dL (ref 26–217)
Total Protein ELP: 6.2 g/dL (ref 6.0–8.5)

## 2022-02-01 ENCOUNTER — Ambulatory Visit: Payer: Medicare Other

## 2022-02-02 ENCOUNTER — Telehealth: Payer: Self-pay | Admitting: Physician Assistant

## 2022-02-02 NOTE — Telephone Encounter (Signed)
I spoke to Ms. Alexa Buchanan to review the lab results from 01/27/2022. Findings showed no evidence of monoclonal protein. Serum kappa free light chain was elevated but 24 hour UPEP was negative for monoclonal protein. No indication for underlying paraproteinemia so do not recommend bone marrow biopsy. Additionally, anemia resolved and creatinine levels is essentially back to normal. She has persistent hypercalcemia that has increased since hospital discharge from 8.1 to 10.8. Dr. Lorenso Courier recommends referral to endocrinology for further evaluation. Patient can return to the hematology clinic as needed. Patient expressed understanding and satisfaction with the plan provided.

## 2022-02-07 ENCOUNTER — Ambulatory Visit: Payer: Medicare Other | Admitting: Diagnostic Neuroimaging

## 2022-02-08 ENCOUNTER — Ambulatory Visit: Payer: Medicare Other | Admitting: Diagnostic Neuroimaging

## 2022-02-10 DIAGNOSIS — K5901 Slow transit constipation: Secondary | ICD-10-CM | POA: Diagnosis not present

## 2022-02-10 DIAGNOSIS — K743 Primary biliary cirrhosis: Secondary | ICD-10-CM | POA: Diagnosis not present

## 2022-02-17 ENCOUNTER — Ambulatory Visit
Admission: RE | Admit: 2022-02-17 | Discharge: 2022-02-17 | Disposition: A | Discharge status: Home / Self Care | Payer: Medicare Other | Source: Ambulatory Visit | Attending: Obstetrics and Gynecology | Admitting: Obstetrics and Gynecology

## 2022-02-17 DIAGNOSIS — Z1231 Encounter for screening mammogram for malignant neoplasm of breast: Secondary | ICD-10-CM

## 2022-03-17 DIAGNOSIS — I10 Essential (primary) hypertension: Secondary | ICD-10-CM | POA: Diagnosis not present

## 2022-03-23 ENCOUNTER — Ambulatory Visit: Payer: Self-pay

## 2022-03-23 ENCOUNTER — Ambulatory Visit: Payer: Medicare Other | Admitting: Orthopedic Surgery

## 2022-03-23 DIAGNOSIS — G8929 Other chronic pain: Secondary | ICD-10-CM

## 2022-03-23 DIAGNOSIS — M5442 Lumbago with sciatica, left side: Secondary | ICD-10-CM | POA: Diagnosis not present

## 2022-03-23 DIAGNOSIS — M5441 Lumbago with sciatica, right side: Secondary | ICD-10-CM | POA: Diagnosis not present

## 2022-03-23 MED ORDER — HYDROCODONE-ACETAMINOPHEN 5-325 MG PO TABS: 1.0000 | ORAL_TABLET | ORAL | 0 refills | Status: AC | PRN

## 2022-03-23 NOTE — Progress Notes (Signed)
Orthopedic Spine Surgery Office Note  Assessment: Patient is a 72 y.o. female with right sided paraspinal muscle pain. TTP in the CVA and just distal to it, but no pain with percussion. Possible that it is related to her kidney stones, but she has seen urology who does not think it is consistent. She has paraspinal fatty atrophy and facet arthrosis are also a possible etiology   Plan: -Explained that initially conservative treatment is tried as a significant number of patients may experience relief with these treatment modalities. Discussed that the conservative treatments include:  -activity modification  -physical therapy  -over the counter pain medications  -medrol dosepak  -steroid injections -Patient has tried activity modification, home exercises  -Norco prescription sent to her pharmacy -Recommended PT (referral provided), voltaren gel with increased pressure over time to desensitize the area, and core strengthening exercises (packet provided) -Would need to quit nicotine-containing products if we were to ever consider surgery as an option -Patient should return to office in 6 weeks, x-rays at next visit: none   Patient expressed understanding of the plan and all questions were answered to the patient's satisfaction.   ___________________________________________________________________________   History:  Patient is a 72 y.o. female who presents today for lumbar spine. Patient has had pain since August 2023. She did not have any falls or trauma that preceded her pain. Pain is felt on the right side in the regio of the paraspinal muscles just caudal to the ribs. It was severe back in 2023 but has improved. She says that she could not walk at the time or do anything because of the pain. The pain is now more tolerable but it is still felt on a daily basis. It waxes and wanes in terms of intensity. She has no pain radiating into her legs. She feels like coughing makes it worse but has  not noticed anything else that makes it better or worse. She has kidney stones bilaterally but was told by urology that that is not causing her back pain. Denies paresthesias and numbness.    Weakness: denies Symptoms of imbalance: denies Paresthesias and numbness: denies Bowel or bladder incontinence: denies Saddle anesthesia: denies  Treatments tried: activity modification, home exercises  Review of systems: Denies fevers and chills, night sweats, unexplained weight loss, history of cancer. Has had back pain that wakes her at night   Past medical history: PBC Hypercalcemia DM (last A1C was 5.3 on 01/07/2022) Migraines Osteoarthritis Diverticulosis IBS Sleep apnea  Allergies: empagliflozin  Past surgical history:  Hysterectomy Bladder sling Vulva surgery Breast lumpectomy D&C of uterus Rectocele repair  Social history: Reports use of nicotine product (smoking, vaping, patches, smokeless) - 1ppd Alcohol use: denies Denies recreational drug use   Physical Exam:  General: no acute distress, appears stated age Neurologic: alert, answering questions appropriately, following commands Respiratory: unlabored breathing on room air, symmetric chest rise Psychiatric: appropriate affect, normal cadence to speech   MSK (spine):  -Strength exam      Left  Right EHL    5/5  5/5 TA    5/5  5/5 GSC    5/5  5/5 Knee extension  5/5  5/5 Hip flexion   5/5  5/5  -Sensory exam    Sensation intact to light touch in L3-S1 nerve distributions of bilateral lower extremities  -Achilles DTR: 2/4 on the left, 2/4 on the right -Patellar tendon DTR: 2/4 on the left, 2/4 on the right  -Straight leg raise: negative -Contralateral straight leg raise: negative -  Clonus: no beats bilaterally  -She has tenderness to palpation along the lateral edge of the paraspinal muscles on the right side just inferior the 12th rib. Her tenderness does go further caudally in that same line to  about L4 -No tenderness to palpation over the remainder of the spine  -Left hip exam: no pain through range of motion, negative stinchfield -Right hip exam: no pain through range of motion, negative stinchfield  Imaging: XR of the lumbar spine from 03/23/2022 was independently reviewed and interpreted, showing disc height loss at multiple levels. No fracture or dislocation. No evidence of instability on flexion/extension views.   MRI of the lumbar spine from 01/01/2022 was independently reviewed and interpreted, showing foraminal stenosis at L4/5 on the left. Lateral recess stenosis at L4/5. Hypertrophic and degenerative changes within the facets at multiple levels. Fatty atrophy noted within the paraspinal muscles.    Patient name: Alexa Buchanan Patient MRN: [id number] Date of visit: 03/23/22

## 2022-04-03 ENCOUNTER — Encounter: Payer: Self-pay | Admitting: Physical Therapy

## 2022-04-03 ENCOUNTER — Ambulatory Visit: Payer: Medicare Other | Admitting: Physical Therapy

## 2022-04-03 DIAGNOSIS — R293 Abnormal posture: Secondary | ICD-10-CM

## 2022-04-03 DIAGNOSIS — M5459 Other low back pain: Secondary | ICD-10-CM | POA: Diagnosis not present

## 2022-04-03 DIAGNOSIS — M6281 Muscle weakness (generalized): Secondary | ICD-10-CM

## 2022-04-03 DIAGNOSIS — M545 Low back pain, unspecified: Secondary | ICD-10-CM

## 2022-04-03 DIAGNOSIS — R252 Cramp and spasm: Secondary | ICD-10-CM | POA: Diagnosis not present

## 2022-04-03 NOTE — Therapy (Signed)
OUTPATIENT PHYSICAL THERAPY THORACOLUMBAR EVALUATION   Patient Name: Alexa Buchanan MRN: [id number] DOB:May 30, 1950, 72 y.o., female Today's Date: 04/03/2022  END OF SESSION:  PT End of Session - 04/03/22 1027     Visit Number 1    Number of Visits 9    Date for PT Re-Evaluation 05/01/22    Authorization Type [employer] Medicare    Authorization Time Period 04/03/22 to 05/01/22    Progress Note Due on Visit 10    PT Start Time 0933    PT Stop Time 1013    PT Time Calculation (min) 40 min    Activity Tolerance Patient tolerated treatment well    Behavior During Therapy [facility] for tasks assessed/performed             Past Medical History:  Diagnosis Date   Arthritis    Complication of anesthesia    woke up to soon.  reported "bad gag reflex"      Diabetes mellitus    Type 2   Diverticulosis    Dysmetabolic syndrome X    Histoplasmosis 1971   IBS (irritable bowel syndrome)    Liver disease    PBC   Migraines    ice pick headaches, Dr. Marylou Flesher   OSA (obstructive sleep apnea) 07/27/2011   Home sleep study 08/18/11 >> AHI 8.1, SpO2 low 74%.  pt stated she does not have sleep apnes   Seasonal allergies    UTI symptoms    Past Surgical History:  Procedure Laterality Date   BREAST EXCISIONAL BIOPSY Right    BREAST LUMPECTOMY WITH RADIOACTIVE SEED LOCALIZATION Right 07/22/2015   Procedure: BREAST LUMPECTOMY WITH RADIOACTIVE SEED LOCALIZATION;  Surgeon: Abigail Miyamoto, MD;  Location: [facility] OR;  Service: General;  Laterality: Right;   COLONOSCOPY     Tics; Dr Juanda Chance   DILATION AND CURETTAGE OF UTERUS     Gastric Plication  1981   KNEE ARTHROSCOPY Left 1990   LASER ABLATION OF VASCULAR LESION  2009   Dr. Dorma Russell, wart removed from nose   RECTOCELE REPAIR  2003   with vaginal wall repair and sling   ROTATOR CUFF REPAIR Right 2016   TONSILLECTOMY     1982/1992   VAGINAL HYSTERECTOMY  2003   Vag Hyst,BSO,Sling, posterior repair   VULVA SURGERY     keratoses   WRIST SURGERY Left  1988    tendon repair   Patient Active Problem List   Diagnosis Date Noted   Malnutrition of moderate degree 01/11/2022   AKI (acute kidney injury) (HCC) 01/09/2022   Hypercalcemia 01/07/2022   Neuropathy 01/03/2022   Low back pain 12/14/2021   Osteoarthritis cervical spine 07/06/2021   Mixed conductive and sensorineural hearing loss of left ear with restricted hearing of right ear 04/07/2021   Pulsatile tinnitus, left ear 02/12/2020   Acute bronchitis 02/10/2014   Smoker 11/12/2013   Uncontrolled type 2 diabetes mellitus with hypoglycemia, with long-term current use of insulin (HCC) 12/03/2012   Elevated blood pressure reading without diagnosis of hypertension 09/04/2012   OSA (obstructive sleep apnea) 07/27/2011   Insomnia 07/27/2011   Wheezing 07/27/2011   Unspecified adverse effect of unspecified drug, medicinal and biological substance 07/04/2011   Vitamin D deficiency 12/29/2010   CERVICAL RADICULOPATHY, RIGHT 04/21/2010   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   DIVERTICULOSIS, COLON 09/14/2008   DYSMETABOLIC SYNDROME X 09/10/2007   IBS 09/10/2007   Uncontrolled secondary diabetes with peripheral neuropathy 02/04/2007   DEPRESSION 02/04/2007   MIGRAINE HEADACHE 02/04/2007   STRESS INCONTINENCE  02/04/2007   HISTOPLASMOSIS, HX OF 02/04/2007   LIVER FUNCTION TESTS, ABNORMAL, HX OF 02/04/2007   COLITIS, HX OF 02/04/2007    PCP: Laurian Brim MD   REFERRING PROVIDER: Callie Fielding, MD  REFERRING DIAG:  M54.41,M54.42,G89.29 (ICD-10-CM) - Chronic left-sided low back pain with bilateral sciatica    Rationale for Evaluation and Treatment: Rehabilitation  THERAPY DIAG:  Other low back pain  Abnormal posture  Muscle weakness (generalized)  Cramp and spasm  ONSET DATE: 03/23/2022  SUBJECTIVE:                                                                                                                                                                                            SUBJECTIVE STATEMENT:  I hurt my back in late August turning over in bed, had a very sharp pain. In September my blood work was great but shortly after that my body started shutting down, had 3 major falls one of which I was hurt from, kept having progressive issues. Took another blood test in November, my Ca+ levels were life-threateningly high and I spent a week in the hospital. Everyone thought my back pain was related to this from nerve damage or mini-stroke, after we go the hypercalcemia straightened out the back pain was still there. If I twist certain ways, or when I cough, the pain is awful. Saw a PT after the hospital for HHPT, he worked on my back problem. I've done every exercise he gave me every single day and the pain got better but never went away. I work on the computer a lot and I sit with a heat pack on my back. Ever since I got out of the hospital I get extremely cold. In July or August of that same year they found B kidney stones but urologist told me the stones are not causing the pain. I can't stand up straight, I have pain all the time, coughing is awful and when I sleep I have to be half sitting up bc I can't stretch out flat. Dr. Laurance Flatten wasn't sure what was going on, wanted me to try PT and also recommended acupuncture, therapeutic massage, and DN. I'm sick to death of doing exercises that aren't doing anything, I can't get out of car, I can't pick up my grandkids, getting OOB is murder, going to stand up is murder. HHPT gave me some stretches (windshield wipers with LEs bent and straight), had standing exercises with wt shifts, lumbar extensions against wall, shoulder extensions, horizontal ABD, prayer stretches.   PERTINENT HISTORY:  Orthopedic Spine Surgery Office Note   Assessment: Patient is a 72 y.o.  female with right sided paraspinal muscle pain. TTP in the CVA and just distal to it, but no pain with percussion. Possible that it is related to her kidney stones, but she  has seen urology who does not think it is consistent. She has paraspinal fatty atrophy and facet arthrosis are also a possible etiology     Plan: -Explained that initially conservative treatment is tried as a significant number of patients may experience relief with these treatment modalities. Discussed that the conservative treatments include:             -activity modification             -physical therapy             -over the counter pain medications             -medrol dosepak             -steroid injections -Patient has tried activity modification, home exercises  -Norco prescription sent to her pharmacy -Recommended PT (referral provided), voltaren gel with increased pressure over time to desensitize the area, and core strengthening exercises (packet provided) -Would need to quit nicotine-containing products if we were to ever consider surgery as an option -Patient should return to office in 6 weeks, x-rays at next visit: none     Patient expressed understanding of the plan and all questions were answered to the patient's satisfaction.    ___________________________________________________________________________     History:   Patient is a 72 y.o. female who presents today for lumbar spine. Patient has had pain since August 2023. She did not have any falls or trauma that preceded her pain. Pain is felt on the right side in the regio of the paraspinal muscles just caudal to the ribs. It was severe back in 2023 but has improved. She says that she could not walk at the time or do anything because of the pain. The pain is now more tolerable but it is still felt on a daily basis. It waxes and wanes in terms of intensity. She has no pain radiating into her legs. She feels like coughing makes it worse but has not noticed anything else that makes it better or worse. She has kidney stones bilaterally but was told by urology that that is not causing her back pain. Denies paresthesias and  numbness.       PAIN:  Are you having pain? Yes: NPRS scale: 5-6/10 Pain location: R lumbar paraspinals around kidney  Pain description: driving fist into back all the time, dull pain that can get sharp  Aggravating factors: turning certain ways, transfers, picking up grandkids, getting in/out of car  Relieving factors: Laying in correct position with pressure against my back   PRECAUTIONS: Fall   WEIGHT BEARING RESTRICTIONS: No  FALLS:  Has patient fallen in last 6 months? Yes. 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 Laurance Flatten in 6 weeks   OBJECTIVE:  DIAGNOSTIC FINDINGS:    PATIENT SURVEYS:  FOTO 51, predicted 6961  SCREENING FOR RED FLAGS: Bowel or bladder incontinence: No Spinal tumors: No Cauda equina syndrome: No Compression fracture: No Abdominal aneurysm: No  COGNITION: Overall cognitive status: Within functional limits for tasks assessed But very talkative     SENSATION: Not tested  MUSCLE LENGTH: Moderately tight HS and piriformis groups B   POSTURE: rounded shoulders, forward head, decreased lumbar lordosis, increased thoracic kyphosis, and flexed trunk   PALPATION: Severe mm spasms noted B lumbar paraspinals, noted "back mouse" lower R lumbar paraspinals   LUMBAR ROM:   AROM eval  Flexion   Extension   Right lateral flexion   Left lateral flexion   Right rotation   Left rotation    (Blank rows = not  tested)  LOWER EXTREMITY ROM:     Active  Right eval Left eval  Hip flexion    Hip extension    Hip abduction    Hip adduction    Hip internal rotation    Hip external rotation    Knee flexion    Knee extension    Ankle dorsiflexion    Ankle plantarflexion    Ankle inversion    Ankle eversion     (Blank rows = not tested)  LOWER EXTREMITY MMT:    MMT Right eval Left eval  Hip flexion 4+ 4+  Hip extension    Hip abduction 4+ 4+  Hip adduction    Hip internal rotation    Hip external rotation    Knee flexion 4+ 4+  Knee extension 4+ 4+  Ankle dorsiflexion 5 5  Ankle plantarflexion    Ankle inversion    Ankle eversion     (Blank rows = not tested)  LUMBAR SPECIAL TESTS:    FUNCTIONAL TESTS:    GAIT:   TODAY'S TREATMENT:                                                                                                                              DATE:   Eval  Objective measures/education     PATIENT EDUCATION:  Education details: exam findings, POC, benefits of water therapy and DN  Person educated: Patient Education method: Explanation Education comprehension: verbalized understanding and needs further education  HOME EXERCISE PROGRAM:  Will give next session   ASSESSMENT:  CLINICAL IMPRESSION: Patient is a 72 y.o. F who was seen today for physical therapy evaluation and treatment for chronic back pain. Very frustrated over her chronic back pain and recent health problems, cooperative but very talkative this morning which did limit eval. Exam reveals significant functional mm weakness, postural limitations, limited functional activity tolerance, limited mm flexibility, chronic pain, and muscle spasms. Has had limited relief from PT in the past. May really benefit from water therapy down the line if we cannot resolve pain with DN. Will benefit from a trial of skilled PT services to attempt to address pain  and improve current level of function.    OBJECTIVE IMPAIRMENTS: decreased activity tolerance, decreased balance, decreased mobility, difficulty walking, decreased ROM, decreased strength, hypomobility, increased fascial restrictions, increased muscle spasms, impaired flexibility, improper body mechanics, postural dysfunction, and pain.   ACTIVITY LIMITATIONS: carrying, lifting, bending, sitting, standing, squatting, stairs, transfers, bed mobility, and locomotion level  PARTICIPATION LIMITATIONS: cleaning, laundry, driving, shopping, community activity, occupation, and church  PERSONAL FACTORS: Age, Behavior pattern, Education, Fitness, Past/current experiences, and Time since onset of injury/illness/exacerbation are also affecting patient's functional outcome.   REHAB POTENTIAL: Fair limited progress with PT in the past   CLINICAL DECISION MAKING: Evolving/moderate complexity  EVALUATION COMPLEXITY: Moderate   GOALS: Goals reviewed with patient? Yes  SHORT TERM GOALS: Target date: 05/01/2022    Will be compliant with appropriate progressive HEP  Baseline: Goal status: INITIAL  2.  Will be able to perform all transfers with pain no more than 2-3/10 Baseline:  Goal status: INITIAL  3.  Will improve ability to perform lumbar extension/get to upright posture with 50% reduction in pain  Baseline:  Goal status: INITIAL  4.  Will demonstrate good mechanics for floor to counter and good mechanics for bed mobility to avoid aggravation of pain  Baseline:  Goal status: INITIAL    LONG TERM GOALS: Target date: will update LTGs if she makes reasonable progress towards STGs     PLAN:  PT FREQUENCY: 2x/week  PT DURATION: 4 weeks  PLANNED INTERVENTIONS: Therapeutic exercises, Therapeutic activity, Neuromuscular re-education, Balance training, Gait training, Patient/Family education, Self Care, Joint mobilization, Stair training, Orthotic/Fit training, Aquatic Therapy, Dry Needling, Electrical stimulation, Spinal  mobilization, Cryotherapy, Moist heat, Taping, Traction, Ultrasound, Ionotophoresis 4mg /ml Dexamethasone, Manual therapy, and Re-evaluation.  PLAN FOR NEXT SESSION: strength, flexibility, wants dry needling!!! Trial for 4 weeks to see if PT + dry needling improves pain, if not would consider referral to water therapy at [facility] as next step    Deniece Ree PT DPT PN2  04/03/2022, 10:28 AM

## 2022-04-17 ENCOUNTER — Encounter: Payer: Self-pay | Admitting: Physical Therapy

## 2022-04-17 ENCOUNTER — Ambulatory Visit: Payer: Medicare Other | Admitting: Physical Therapy

## 2022-04-17 DIAGNOSIS — M542 Cervicalgia: Secondary | ICD-10-CM | POA: Diagnosis not present

## 2022-04-17 DIAGNOSIS — M5459 Other low back pain: Secondary | ICD-10-CM | POA: Diagnosis not present

## 2022-04-17 DIAGNOSIS — M6281 Muscle weakness (generalized): Secondary | ICD-10-CM

## 2022-04-17 DIAGNOSIS — R293 Abnormal posture: Secondary | ICD-10-CM | POA: Diagnosis not present

## 2022-04-17 DIAGNOSIS — R252 Cramp and spasm: Secondary | ICD-10-CM | POA: Diagnosis not present

## 2022-04-17 NOTE — Therapy (Signed)
OUTPATIENT PHYSICAL THERAPY THORACOLUMBAR TREATMENT NOTE   Patient Name: Alexa Buchanan MRN: [id number] DOB:08/08/1950, 72 y.o., female Today's Date: 04/17/2022  END OF SESSION:  PT End of Session - 04/17/22 1017     Visit Number 2    Number of Visits 9    Date for PT Re-Evaluation 05/01/22    Authorization Type BCBS Medicare    Authorization Time Period 04/03/22 to 05/01/22    PT Start Time 0932    PT Stop Time 1016    PT Time Calculation (min) 44 min    Activity Tolerance Patient tolerated treatment well    Behavior During Therapy [facility] for tasks assessed/performed              Past Medical History:  Diagnosis Date   Arthritis    Complication of anesthesia    woke up to soon.  reported "bad gag reflex"      Diabetes mellitus    Type 2   Diverticulosis    Dysmetabolic syndrome X    Histoplasmosis 1971   IBS (irritable bowel syndrome)    Liver disease    PBC   Migraines    ice pick headaches, Dr. Beacher May   OSA (obstructive sleep apnea) 07/27/2011   Home sleep study 08/18/11 >> AHI 8.1, SpO2 low 74%.  pt stated she does not have sleep apnes   Seasonal allergies    UTI symptoms    Past Surgical History:  Procedure Laterality Date   BREAST EXCISIONAL BIOPSY Right    BREAST LUMPECTOMY WITH RADIOACTIVE SEED LOCALIZATION Right 07/22/2015   Procedure: BREAST LUMPECTOMY WITH RADIOACTIVE SEED LOCALIZATION;  Surgeon: Coralie Keens, MD;  Location: [place];  Service: General;  Laterality: Right;   COLONOSCOPY     Tics; Dr Olevia Perches   DILATION AND CURETTAGE OF UTERUS     Gastric Plication  [id number]   KNEE ARTHROSCOPY Left 1990   LASER ABLATION OF VASCULAR LESION  2009   Dr. Thornell Mule, wart removed from [place]  2003   with vaginal wall repair and sling   ROTATOR CUFF REPAIR Right 2016   TONSILLECTOMY     1982/1992   VAGINAL HYSTERECTOMY  2003   Vag Hyst,BSO,Sling, posterior repair   VULVA SURGERY     keratoses   WRIST SURGERY Left 1988    tendon repair    Patient Active Problem List   Diagnosis Date Noted   Malnutrition of moderate degree 01/11/2022   AKI (acute kidney injury) ([place]) 01/09/2022   Hypercalcemia 01/07/2022   Neuropathy 01/03/2022   Low back pain 12/14/2021   Osteoarthritis cervical spine 07/06/2021   Mixed conductive and sensorineural hearing loss of left ear with restricted hearing of right ear 04/07/2021   Pulsatile tinnitus, left ear 02/12/2020   Acute bronchitis 02/10/2014   Smoker 11/12/2013   Uncontrolled type 2 diabetes mellitus with hypoglycemia, with long-term current use of insulin ([place]) 12/03/2012   Elevated blood pressure reading without diagnosis of hypertension 09/04/2012   OSA (obstructive sleep apnea) 07/27/2011   Insomnia 07/27/2011   Wheezing 07/27/2011   Unspecified adverse effect of unspecified drug, medicinal and biological substance 07/04/2011   Vitamin D deficiency 12/29/2010   CERVICAL RADICULOPATHY, RIGHT 04/21/2010   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   DIVERTICULOSIS, COLON [id number]   DYSMETABOLIC SYNDROME X [id number]   IBS 09/10/2007   Uncontrolled secondary diabetes with peripheral neuropathy 02/04/2007   DEPRESSION 02/04/2007   MIGRAINE HEADACHE 02/04/2007   STRESS INCONTINENCE 02/04/2007   HISTOPLASMOSIS, HX OF 02/04/2007  LIVER FUNCTION TESTS, ABNORMAL, HX OF 02/04/2007   COLITIS, HX OF 02/04/2007    PCP: Laurian Brim MD   REFERRING PROVIDER: Callie Fielding, MD  REFERRING DIAG:  M54.41,M54.42,G89.29 (ICD-10-CM) - Chronic left-sided low back pain with bilateral sciatica    Rationale for Evaluation and Treatment: Rehabilitation  THERAPY DIAG:  Other low back pain  Abnormal posture  Muscle weakness (generalized)  Cramp and spasm  Cervicalgia  ONSET DATE: 03/23/2022  SUBJECTIVE:                                                                                                                                                                                            SUBJECTIVE STATEMENT: Pt arriving today reporting 6/10 pain. Pt wishing to try DN today for low back pain.    PERTINENT HISTORY:  Orthopedic Spine Surgery Office Note   Assessment: Patient is a 72 y.o. female with right sided paraspinal muscle pain. TTP in the CVA and just distal to it, but no pain with percussion. Possible that it is related to her kidney stones, but she has seen urology who does not think it is consistent. She has paraspinal fatty atrophy and facet arthrosis are also a possible etiology     Plan: -Explained that initially conservative treatment is tried as a significant number of patients may experience relief with these treatment modalities. Discussed that the conservative treatments include:             -activity modification             -physical therapy             -over the counter pain medications             -medrol dosepak             -steroid injections -Patient has tried activity modification, home exercises  -Norco prescription sent to her pharmacy -Recommended PT (referral provided), voltaren gel with increased pressure over time to desensitize the area, and core strengthening exercises (packet provided) -Would need to quit nicotine-containing products if we were to ever consider surgery as an option -Patient should return to office in 6 weeks, x-rays at next visit: none     Patient expressed understanding of the plan and all questions were answered to the patient's satisfaction.    ___________________________________________________________________________     History:   Patient is a 72 y.o. female who presents today for lumbar spine. Patient has had pain since August 2023. She did not have any falls or trauma that preceded her pain. Pain is felt on the right side in the regio of the paraspinal  muscles just caudal to the ribs. It was severe back in 2023 but has improved. She says that she could not walk at the time or do anything because of the pain.  The pain is now more tolerable but it is still felt on a daily basis. It waxes and wanes in terms of intensity. She has no pain radiating into her legs. She feels like coughing makes it worse but has not noticed anything else that makes it better or worse. She has kidney stones bilaterally but was told by urology that that is not causing her back pain. Denies paresthesias and numbness.       PAIN:  Are you having pain? Yes: NPRS scale: 6/10 Pain location: R lumbar paraspinals around kidney  Pain description: driving fist into back all the time, dull pain that can get sharp  Aggravating factors: turning certain ways, transfers, picking up grandkids, getting in/out of car  Relieving factors: Laying in correct position with pressure against my back   PRECAUTIONS: Fall   WEIGHT BEARING RESTRICTIONS: No  FALLS:  Has patient fallen in last 6 months? Yes. 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 Laurance Flatten in 6 weeks   OBJECTIVE:   DIAGNOSTIC FINDINGS:    PATIENT SURVEYS:  FOTO 51, predicted 68  SCREENING FOR RED FLAGS: Bowel or bladder incontinence: No Spinal tumors: No Cauda equina syndrome: No Compression fracture: No Abdominal aneurysm: No  COGNITION: Overall cognitive status: Within functional limits for tasks assessed But very talkative     SENSATION: Not tested  MUSCLE LENGTH: Moderately tight HS and piriformis groups B   POSTURE:  rounded shoulders, forward head, decreased lumbar lordosis, increased thoracic kyphosis, and flexed trunk   PALPATION: Severe mm spasms noted B lumbar paraspinals, noted "back mouse" lower R lumbar paraspinals   LUMBAR ROM:   AROM 04/17/22  Flexion 30 degrees with pain   Extension 10 degress c pain  Right lateral flexion   Left lateral flexion   Right rotation Limited 75%  Left rotation Limited 75%   (Blank rows = not tested)  LOWER EXTREMITY ROM:     Active  Right eval Left eval  Hip flexion    Hip extension    Hip abduction    Hip adduction    Hip internal rotation    Hip external rotation    Knee flexion    Knee extension    Ankle dorsiflexion    Ankle plantarflexion    Ankle inversion    Ankle eversion     (Blank rows = not tested)  LOWER EXTREMITY MMT:    MMT Right eval Left eval  Hip flexion 4+ 4+  Hip extension    Hip abduction 4+ 4+  Hip adduction    Hip internal rotation    Hip external rotation    Knee flexion 4+ 4+  Knee extension 4+ 4+  Ankle dorsiflexion 5 5  Ankle plantarflexion    Ankle  inversion    Ankle eversion     (Blank rows = not tested)  LUMBAR SPECIAL TESTS:    FUNCTIONAL TESTS:    GAIT:   TODAY'S TREATMENT:                                                                                                                              DATE:  04/17/22:  TherEx:  Prone hip internal and external rotation, trunk rotation x 2 minutes Attempted prone on elbows, pt unable to tolerate Supine: hamstring stretch passive x 2 each LE holding 20 sec Supine clam shells: x 20  PPT: 2 x 10 holding 5 sec PPT followed by bridge: x 10  SKTC: x 3 to each side holding 20 sec Manual:  STM and active trigger point release and skilled palpation for TPDN Trigger Point Dry-Needling  Treatment instructions: Expect mild to moderate muscle soreness. S/S of pneumothorax if dry needled over a lung field, and to seek immediate medical attention should  they occur. Patient verbalized understanding of these instructions and education.  Patient Consent Given: Yes Education handout provided: Yes Muscles treated: bilateral lumbar paraspinals, Rt QL Treatment response/outcome: multiple twitch response noted, pt reporting feeling less stiff at end of session.  Modalities:  Moist heat to lumbar spine during supine exercises   Eval  Objective measures/education     PATIENT EDUCATION:  Education details: exam findings, POC, benefits of water therapy and DN  Person educated: Patient Education method: Explanation Education comprehension: verbalized understanding and needs further education  HOME EXERCISE PROGRAM: Access Code: [email or web address] URL: [email or web address] Date: 04/17/2022 Prepared by: Kearney Hard  Exercises - Supine Posterior Pelvic Tilt  - 2 x daily - 7 x weekly - 2 sets - 10 reps - 5 seconds hold - Supine Bridge  - 2 x daily - 7 x weekly - 2 sets - 10 reps - Hooklying Single Knee to Chest Stretch  - 2 x daily - 7 x weekly - 3 reps - 20 seconds hold - Supine Lower Trunk Rotation with PLB  - 2 x daily - 7 x weekly - 3 reps - 20 seconds hold  ASSESSMENT:  CLINICAL IMPRESSION: Pt arriving today reporting 6/10 pain at rest. Pt stating with movements her pain increases to 8/10. Pt's HEP was given and pt able to demonstrate each exercise with tactile and verbal cues. Pt having difficulty with extension based exercises and positioning. Pt requesting DN this and pt reporting less stiffness following treatment with multiple twitch responses noted. Continue with skilled PT to maximize pt's function.   OBJECTIVE IMPAIRMENTS: decreased activity tolerance, decreased balance, decreased mobility, difficulty walking, decreased ROM, decreased strength, hypomobility, increased fascial restrictions, increased muscle spasms, impaired flexibility, improper body mechanics, postural dysfunction, and pain.   ACTIVITY LIMITATIONS:  carrying, lifting, bending, sitting, standing, squatting, stairs, transfers, bed mobility, and locomotion level  PARTICIPATION LIMITATIONS: cleaning, laundry, driving, shopping, community activity, occupation, and church  PERSONAL FACTORS: Age, Behavior  pattern, Education, Fitness, Past/current experiences, and Time since onset of injury/illness/exacerbation are also affecting patient's functional outcome.   REHAB POTENTIAL: Fair limited progress with PT in the past   CLINICAL DECISION MAKING: Evolving/moderate complexity  EVALUATION COMPLEXITY: Moderate   GOALS: Goals reviewed with patient? Yes  SHORT TERM GOALS: Target date: 05/01/2022    Will be compliant with appropriate progressive HEP  Baseline: Goal status: On-going 04/17/22  2.  Will be able to perform all transfers with pain no more than 2-3/10 Baseline:  Goal status: INITIAL  3.  Will improve ability to perform lumbar extension/get to upright posture with 50% reduction in pain  Baseline:  Goal status: INITIAL  4.  Will demonstrate good mechanics for floor to counter and good mechanics for bed mobility to avoid aggravation of pain  Baseline:  Goal status: INITIAL    LONG TERM GOALS: Target date: will update LTGs if she makes reasonable progress towards STGs     PLAN:  PT FREQUENCY: 2x/week  PT DURATION: 4 weeks  PLANNED INTERVENTIONS: Therapeutic exercises, Therapeutic activity, Neuromuscular re-education, Balance training, Gait training, Patient/Family education, Self Care, Joint mobilization, Stair training, Orthotic/Fit training, Aquatic Therapy, Dry Needling, Electrical stimulation, Spinal mobilization, Cryotherapy, Moist heat, Taping, Traction, Ultrasound, Ionotophoresis 32m/ml Dexamethasone, Manual therapy, and Re-evaluation.  PLAN FOR NEXT SESSION:  STM at next visit per pt's request assess response to DN, strength, flexibility, assess response to HEP, Trial for 4 weeks to see if PT + dry needling  improves pain, if not [facility]    JKearney Hard PT, MPT 04/17/22 10:26 AM   04/17/2022, 10:26 AM

## 2022-04-18 DIAGNOSIS — K743 Primary biliary cirrhosis: Secondary | ICD-10-CM | POA: Diagnosis not present

## 2022-04-19 ENCOUNTER — Encounter: Payer: Self-pay | Admitting: Physical Therapy

## 2022-04-19 ENCOUNTER — Ambulatory Visit: Payer: Medicare Other | Admitting: Physical Therapy

## 2022-04-19 DIAGNOSIS — R252 Cramp and spasm: Secondary | ICD-10-CM

## 2022-04-19 DIAGNOSIS — M5459 Other low back pain: Secondary | ICD-10-CM

## 2022-04-19 DIAGNOSIS — R293 Abnormal posture: Secondary | ICD-10-CM | POA: Diagnosis not present

## 2022-04-19 DIAGNOSIS — M6281 Muscle weakness (generalized): Secondary | ICD-10-CM | POA: Diagnosis not present

## 2022-04-19 NOTE — Therapy (Signed)
OUTPATIENT PHYSICAL THERAPY THORACOLUMBAR       TREATMENT NOTE  Patient Name: Alexa Buchanan MRN: [id number] DOB:May 20, 1950, 72 y.o., female Today's Date: 04/19/2022  END OF SESSION:  PT End of Session - 04/19/22 1005     Visit Number 3    Number of Visits 9    Date for PT Re-Evaluation 05/01/22    Authorization Type BCBS Medicare    Authorization Time Period 04/03/22 to 05/01/22    Progress Note Due on Visit 10    PT Start Time 0925    PT Stop Time 1005    PT Time Calculation (min) 40 min    Activity Tolerance Patient tolerated treatment well    Behavior During Therapy [facility] for tasks assessed/performed              Past Medical History:  Diagnosis Date   Arthritis    Complication of anesthesia    woke up to soon.  reported "bad gag reflex"      Diabetes mellitus    Type 2   Diverticulosis    Dysmetabolic syndrome X    Histoplasmosis 1971   IBS (irritable bowel syndrome)    Liver disease    PBC   Migraines    ice pick headaches, Dr. Beacher May   OSA (obstructive sleep apnea) 07/27/2011   Home sleep study 08/18/11 >> AHI 8.1, SpO2 low 74%.  pt stated she does not have sleep apnes   Seasonal allergies    UTI symptoms    Past Surgical History:  Procedure Laterality Date   BREAST EXCISIONAL BIOPSY Right    BREAST LUMPECTOMY WITH RADIOACTIVE SEED LOCALIZATION Right 07/22/2015   Procedure: BREAST LUMPECTOMY WITH RADIOACTIVE SEED LOCALIZATION;  Surgeon: Coralie Keens, MD;  Location: [place];  Service: General;  Laterality: Right;   COLONOSCOPY     Tics; Dr Olevia Perches   DILATION AND CURETTAGE OF UTERUS     Gastric Plication  [id number]   KNEE ARTHROSCOPY Left 1990   LASER ABLATION OF VASCULAR LESION  2009   Dr. Thornell Mule, wart removed from [place]  2003   with vaginal wall repair and sling   ROTATOR CUFF REPAIR Right 2016   TONSILLECTOMY     1982/1992   VAGINAL HYSTERECTOMY  2003   Vag Hyst,BSO,Sling, posterior repair   VULVA SURGERY     keratoses   WRIST  SURGERY Left 1988    tendon repair   Patient Active Problem List   Diagnosis Date Noted   Malnutrition of moderate degree 01/11/2022   AKI (acute kidney injury) ([place]) 01/09/2022   Hypercalcemia 01/07/2022   Neuropathy 01/03/2022   Low back pain 12/14/2021   Osteoarthritis cervical spine 07/06/2021   Mixed conductive and sensorineural hearing loss of left ear with restricted hearing of right ear 04/07/2021   Pulsatile tinnitus, left ear 02/12/2020   Acute bronchitis 02/10/2014   Smoker 11/12/2013   Uncontrolled type 2 diabetes mellitus with hypoglycemia, with long-term current use of insulin ([place]) 12/03/2012   Elevated blood pressure reading without diagnosis of hypertension 09/04/2012   OSA (obstructive sleep apnea) 07/27/2011   Insomnia 07/27/2011   Wheezing 07/27/2011   Unspecified adverse effect of unspecified drug, medicinal and biological substance 07/04/2011   Vitamin D deficiency 12/29/2010   CERVICAL RADICULOPATHY, RIGHT 04/21/2010   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   DIVERTICULOSIS, COLON [id number]   DYSMETABOLIC SYNDROME X [id number]   IBS 09/10/2007   Uncontrolled secondary diabetes with peripheral neuropathy 02/04/2007   DEPRESSION 02/04/2007  MIGRAINE HEADACHE 02/04/2007   STRESS INCONTINENCE 02/04/2007   HISTOPLASMOSIS, HX OF 02/04/2007   LIVER FUNCTION TESTS, ABNORMAL, HX OF 02/04/2007   COLITIS, HX OF 02/04/2007    PCP: Laurian Brim MD   REFERRING PROVIDER: Callie Fielding, MD  REFERRING DIAG:  M54.41,M54.42,G89.29 (ICD-10-CM) - Chronic left-sided low back pain with bilateral sciatica    Rationale for Evaluation and Treatment: Rehabilitation  THERAPY DIAG:  Other low back pain  Abnormal posture  Muscle weakness (generalized)  Cramp and spasm  ONSET DATE: 03/23/2022  SUBJECTIVE:                                                                                                                                                                                            SUBJECTIVE STATEMENT: Pt arriving today reporting 5-6/10 pain in her Rt sided low back. Pt reporting a good 2 day response to DN last visit.    PERTINENT HISTORY:  Orthopedic Spine Surgery Office Note   Assessment: Patient is a 72 y.o. female with right sided paraspinal muscle pain. TTP in the CVA and just distal to it, but no pain with percussion. Possible that it is related to her kidney stones, but she has seen urology who does not think it is consistent. She has paraspinal fatty atrophy and facet arthrosis are also a possible etiology     Plan: -Explained that initially conservative treatment is tried as a significant number of patients may experience relief with these treatment modalities. Discussed that the conservative treatments include:             -activity modification             -physical therapy             -over the counter pain medications             -medrol dosepak             -steroid injections -Patient has tried activity modification, home exercises  -Norco prescription sent to her pharmacy -Recommended PT (referral provided), voltaren gel with increased pressure over time to desensitize the area, and core strengthening exercises (packet provided) -Would need to quit nicotine-containing products if we were to ever consider surgery as an option -Patient should return to office in 6 weeks, x-rays at next visit: none     Patient expressed understanding of the plan and all questions were answered to the patient's satisfaction.    ___________________________________________________________________________     History:   Patient is a 73 y.o. female who presents today for lumbar spine. Patient has had pain since August 2023. She did not have  any falls or trauma that preceded her pain. Pain is felt on the right side in the regio of the paraspinal muscles just caudal to the ribs. It was severe back in 2023 but has improved. She says that she could not  walk at the time or do anything because of the pain. The pain is now more tolerable but it is still felt on a daily basis. It waxes and wanes in terms of intensity. She has no pain radiating into her legs. She feels like coughing makes it worse but has not noticed anything else that makes it better or worse. She has kidney stones bilaterally but was told by urology that that is not causing her back pain. Denies paresthesias and numbness.       PAIN:  Are you having pain? Yes: NPRS scale: 5-6/10 Pain location: R lumbar paraspinals around kidney  Pain description: driving fist into back all the time, dull pain that can get sharp  Aggravating factors: turning certain ways, transfers, picking up grandkids, getting in/out of car  Relieving factors: Laying in correct position with pressure against my back   PRECAUTIONS: Fall   WEIGHT BEARING RESTRICTIONS: No  FALLS:  Has patient fallen in last 6 months? Yes. 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 Laurance Flatten in 6 weeks   OBJECTIVE:   DIAGNOSTIC FINDINGS:    PATIENT SURVEYS:  FOTO 51, predicted 23  SCREENING FOR RED FLAGS: Bowel or bladder incontinence: No Spinal tumors: No Cauda equina syndrome: No Compression fracture: No Abdominal aneurysm: No  COGNITION: Overall cognitive status: Within functional limits for tasks assessed But very talkative     SENSATION: Not tested  MUSCLE  LENGTH: Moderately tight HS and piriformis groups B   POSTURE: rounded shoulders, forward head, decreased lumbar lordosis, increased thoracic kyphosis, and flexed trunk   PALPATION: Severe mm spasms noted B lumbar paraspinals, noted "back mouse" lower R lumbar paraspinals   LUMBAR ROM:   AROM 04/17/22  Flexion 30 degrees with pain   Extension 10 degress c pain  Right lateral flexion   Left lateral flexion   Right rotation Limited 75%  Left rotation Limited 75%   (Blank rows = not tested)  LOWER EXTREMITY ROM:     Active  Right  Left   Hip flexion    Hip extension    Hip abduction    Hip adduction    Hip internal rotation    Hip external rotation    Knee flexion    Knee extension    Ankle dorsiflexion    Ankle plantarflexion    Ankle inversion    Ankle eversion     (Blank rows = not tested)  LOWER EXTREMITY MMT:    MMT Right eval Left eval  Hip flexion 4+ 4+  Hip extension    Hip abduction 4+ 4+  Hip adduction    Hip internal rotation    Hip external rotation  Knee flexion 4+ 4+  Knee extension 4+ 4+  Ankle dorsiflexion 5 5  Ankle plantarflexion    Ankle inversion    Ankle eversion     (Blank rows = not tested)  LUMBAR SPECIAL TESTS:    FUNCTIONAL TESTS:  04/19/22:  5 time sit to stand: 17 seconds   GAIT:   TODAY'S TREATMENT:                                                                                                                              DATE:  04/19/22:  Manual:  STM and active trigger point release and skilled palpation for TPDN Trigger Point Dry-Needling  Treatment instructions: Expect mild to moderate muscle soreness. S/S of pneumothorax if dry needled over a lung field, and to seek immediate medical attention should they occur. Patient verbalized understanding of these instructions and education.  Patient Consent Given: Yes Education handout provided: Provided previously Muscles treated: bilateral lumbar paraspinals, Rt glute  medius and max Treatment response/outcome: multiple twitch response noted, pt reporting feeling less stiff at end of session.  Modalities:  Moist heat to lumbar prone during supine exercises TherEx:  Prone hip internal and external rotation, trunk rotation x 2 minutes Prone hip extension x 10 each LE alternating Prone hamstring curls x 10 each LE alternating Supine PPT: x 15 holding 3 sec Standing: hip abduction x 10 each LE c UE support Standing calf raises x 15 c UE support Sit to stand: x 10 c UE support Seated hamstring stretch x 3 holding 30 sec using strap Seated LAQ x 15 on each LE   04/17/22:  TherEx:  Prone hip internal and external rotation, trunk rotation x 2 minutes Attempted prone on elbows, pt unable to tolerate Supine: hamstring stretch passive x 2 each LE holding 20 sec Supine clam shells: x 20  PPT: 2 x 10 holding 5 sec PPT followed by bridge: x 10  SKTC: x 3 to each side holding 20 sec Manual:  STM and active trigger point release and skilled palpation for TPDN Trigger Point Dry-Needling  Treatment instructions: Expect mild to moderate muscle soreness. S/S of pneumothorax if dry needled over a lung field, and to seek immediate medical attention should they occur. Patient verbalized understanding of these instructions and education.  Patient Consent Given: Yes Education handout provided: Yes Muscles treated: bilateral lumbar paraspinals, Rt QL Treatment response/outcome: multiple twitch response noted, pt reporting feeling less stiff at end of session.  Modalities:  Moist heat to lumbar spine during supine exercises   Eval  Objective measures/education     PATIENT EDUCATION:  Education details: exam findings, POC, benefits of water therapy and DN  Person educated: Patient Education method: Explanation Education comprehension: verbalized understanding and needs further education  HOME EXERCISE PROGRAM: Access Code: [email or web address] URL:  [email or web address] Date: 04/17/2022 Prepared by: Kearney Hard  Exercises - Supine Posterior Pelvic Tilt  - 2 x daily - 7 x weekly -  2 sets - 10 reps - 5 seconds hold - Supine Bridge  - 2 x daily - 7 x weekly - 2 sets - 10 reps - Hooklying Single Knee to Chest Stretch  - 2 x daily - 7 x weekly - 3 reps - 20 seconds hold - Supine Lower Trunk Rotation with PLB  - 2 x daily - 7 x weekly - 3 reps - 20 seconds hold  ASSESSMENT:  CLINICAL IMPRESSION: Pt reporting good response to DN from last visit. DN was repeated this visit with twitch response and less stiffness noted. Continue skilled PT to maximize pt's function.    OBJECTIVE IMPAIRMENTS: decreased activity tolerance, decreased balance, decreased mobility, difficulty walking, decreased ROM, decreased strength, hypomobility, increased fascial restrictions, increased muscle spasms, impaired flexibility, improper body mechanics, postural dysfunction, and pain.   ACTIVITY LIMITATIONS: carrying, lifting, bending, sitting, standing, squatting, stairs, transfers, bed mobility, and locomotion level  PARTICIPATION LIMITATIONS: cleaning, laundry, driving, shopping, community activity, occupation, and church  PERSONAL FACTORS: Age, Behavior pattern, Education, Fitness, Past/current experiences, and Time since onset of injury/illness/exacerbation are also affecting patient's functional outcome.   REHAB POTENTIAL: Fair limited progress with PT in the past   CLINICAL DECISION MAKING: Evolving/moderate complexity  EVALUATION COMPLEXITY: Moderate   GOALS: Goals reviewed with patient? Yes  SHORT TERM GOALS: Target date: 05/01/2022    Will be compliant with appropriate progressive HEP  Baseline: Goal status: On-going 04/19/22  2.  Will be able to perform all transfers with pain no more than 2-3/10 Baseline:  Goal status: INITIAL  3.  Will improve ability to perform lumbar extension/get to upright posture with 50% reduction  in pain  Baseline:  Goal status: INITIAL  4.  Will demonstrate good mechanics for floor to counter and good mechanics for bed mobility to avoid aggravation of pain  Baseline:  Goal status: INITIAL    LONG TERM GOALS: Target date: will update LTGs if she makes reasonable progress towards STGs     PLAN:  PT FREQUENCY: 2x/week  PT DURATION: 4 weeks  PLANNED INTERVENTIONS: Therapeutic exercises, Therapeutic activity, Neuromuscular re-education, Balance training, Gait training, Patient/Family education, Self Care, Joint mobilization, Stair training, Orthotic/Fit training, Aquatic Therapy, Dry Needling, Electrical stimulation, Spinal mobilization, Cryotherapy, Moist heat, Taping, Traction, Ultrasound, Ionotophoresis 76m/ml Dexamethasone, Manual therapy, and Re-evaluation.  PLAN FOR NEXT SESSION:  STM at next visit per pt's request assess response to DN, strength, flexibility, assess response to HEP, Trial for 4 weeks to see if PT + dry needling improves pain, if not [facility]    JKearney Hard PT, MPT 04/19/22 10:09 AM   04/19/2022, 10:09 AM

## 2022-04-24 ENCOUNTER — Encounter: Payer: Self-pay | Admitting: Physical Therapy

## 2022-04-24 ENCOUNTER — Ambulatory Visit: Payer: Medicare Other | Admitting: Physical Therapy

## 2022-04-24 DIAGNOSIS — M5459 Other low back pain: Secondary | ICD-10-CM | POA: Diagnosis not present

## 2022-04-24 DIAGNOSIS — M542 Cervicalgia: Secondary | ICD-10-CM

## 2022-04-24 DIAGNOSIS — M6281 Muscle weakness (generalized): Secondary | ICD-10-CM

## 2022-04-24 DIAGNOSIS — R252 Cramp and spasm: Secondary | ICD-10-CM

## 2022-04-24 DIAGNOSIS — R293 Abnormal posture: Secondary | ICD-10-CM

## 2022-04-24 NOTE — Therapy (Signed)
OUTPATIENT PHYSICAL THERAPY THORACOLUMBAR       TREATMENT NOTE  Patient Name: Alexa Buchanan MRN: [id number] DOB:April 08, 1950, 72 y.o., female Today's Date: 04/24/2022  END OF SESSION:  PT End of Session - 04/24/22 1011     Visit Number 4    Number of Visits 9    Date for PT Re-Evaluation 05/01/22    Authorization Type BCBS Medicare    Authorization Time Period 04/03/22 to 05/01/22    Progress Note Due on Visit 10    PT Start Time 0932    PT Stop Time 1015    PT Time Calculation (min) 43 min    Activity Tolerance Patient tolerated treatment well    Behavior During Therapy [facility] for tasks assessed/performed               Past Medical History:  Diagnosis Date   Arthritis    Complication of anesthesia    woke up to soon.  reported "bad gag reflex"      Diabetes mellitus    Type 2   Diverticulosis    Dysmetabolic syndrome X    Histoplasmosis 1971   IBS (irritable bowel syndrome)    Liver disease    PBC   Migraines    ice pick headaches, Dr. Beacher May   OSA (obstructive sleep apnea) 07/27/2011   Home sleep study 08/18/11 >> AHI 8.1, SpO2 low 74%.  pt stated she does not have sleep apnes   Seasonal allergies    UTI symptoms    Past Surgical History:  Procedure Laterality Date   BREAST EXCISIONAL BIOPSY Right    BREAST LUMPECTOMY WITH RADIOACTIVE SEED LOCALIZATION Right 07/22/2015   Procedure: BREAST LUMPECTOMY WITH RADIOACTIVE SEED LOCALIZATION;  Surgeon: Coralie Keens, MD;  Location: [facility];  Service: General;  Laterality: Right;   COLONOSCOPY     Tics; Dr Olevia Perches   DILATION AND CURETTAGE OF UTERUS     Gastric Plication  99991111   KNEE ARTHROSCOPY Left 1990   LASER ABLATION OF VASCULAR LESION  2009   Dr. Thornell Mule, wart removed from [place]  2003   with vaginal wall repair and sling   ROTATOR CUFF REPAIR Right 2016   TONSILLECTOMY     1982/1992   VAGINAL HYSTERECTOMY  2003   Vag Hyst,BSO,Sling, posterior repair   VULVA SURGERY     keratoses    WRIST SURGERY Left 1988    tendon repair   Patient Active Problem List   Diagnosis Date Noted   Malnutrition of moderate degree 01/11/2022   AKI (acute kidney injury) ([place]) 01/09/2022   Hypercalcemia 01/07/2022   Neuropathy 01/03/2022   Low back pain 12/14/2021   Osteoarthritis cervical spine 07/06/2021   Mixed conductive and sensorineural hearing loss of left ear with restricted hearing of right ear 04/07/2021   Pulsatile tinnitus, left ear 02/12/2020   Acute bronchitis 02/10/2014   Smoker 11/12/2013   Uncontrolled type 2 diabetes mellitus with hypoglycemia, with long-term current use of insulin ([place]) 12/03/2012   Elevated blood pressure reading without diagnosis of hypertension 09/04/2012   OSA (obstructive sleep apnea) 07/27/2011   Insomnia 07/27/2011   Wheezing 07/27/2011   Unspecified adverse effect of unspecified drug, medicinal and biological substance 07/04/2011   Vitamin D deficiency 12/29/2010   CERVICAL RADICULOPATHY, RIGHT 04/21/2010   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   DIVERTICULOSIS, COLON [id number]   DYSMETABOLIC SYNDROME X [id number]   IBS 09/10/2007   Uncontrolled secondary diabetes with peripheral neuropathy 02/04/2007   DEPRESSION 02/04/2007  MIGRAINE HEADACHE 02/04/2007   STRESS INCONTINENCE 02/04/2007   HISTOPLASMOSIS, HX OF 02/04/2007   LIVER FUNCTION TESTS, ABNORMAL, HX OF 02/04/2007   COLITIS, HX OF 02/04/2007    PCP: Laurian Brim MD   REFERRING PROVIDER: Callie Fielding, MD  REFERRING DIAG:  M54.41,M54.42,G89.29 (ICD-10-CM) - Chronic left-sided low back pain with bilateral sciatica    Rationale for Evaluation and Treatment: Rehabilitation  THERAPY DIAG:  Other low back pain  Abnormal posture  Muscle weakness (generalized)  Cramp and spasm  Cervicalgia  ONSET DATE: 03/23/2022  SUBJECTIVE:                                                                                                                                                                                            SUBJECTIVE STATEMENT: Pt arriving today reporting 5-6/10 pain in her Rt sided low back. Pt stating she spent most of the weekend using heat on her back. Pt wants to walk this week since the weather is nice.    PERTINENT HISTORY:  Orthopedic Spine Surgery Office Note   Assessment: Patient is a 72 y.o. female with right sided paraspinal muscle pain. TTP in the CVA and just distal to it, but no pain with percussion. Possible that it is related to her kidney stones, but she has seen urology who does not think it is consistent. She has paraspinal fatty atrophy and facet arthrosis are also a possible etiology     Plan: -Explained that initially conservative treatment is tried as a significant number of patients may experience relief with these treatment modalities. Discussed that the conservative treatments include:             -activity modification             -physical therapy             -over the counter pain medications             -medrol dosepak             -steroid injections -Patient has tried activity modification, home exercises  -Norco prescription sent to her pharmacy -Recommended PT (referral provided), voltaren gel with increased pressure over time to desensitize the area, and core strengthening exercises (packet provided) -Would need to quit nicotine-containing products if we were to ever consider surgery as an option -Patient should return to office in 6 weeks, x-rays at next visit: none     Patient expressed understanding of the plan and all questions were answered to the patient's satisfaction.    ___________________________________________________________________________     History:   Patient is a 72 y.o. female who presents  today for lumbar spine. Patient has had pain since August 2023. She did not have any falls or trauma that preceded her pain. Pain is felt on the right side in the regio of the paraspinal muscles just  caudal to the ribs. It was severe back in 2023 but has improved. She says that she could not walk at the time or do anything because of the pain. The pain is now more tolerable but it is still felt on a daily basis. It waxes and wanes in terms of intensity. She has no pain radiating into her legs. She feels like coughing makes it worse but has not noticed anything else that makes it better or worse. She has kidney stones bilaterally but was told by urology that that is not causing her back pain. Denies paresthesias and numbness.       PAIN:  Are you having pain? Yes: NPRS scale: 5-6/10 Pain location: R lumbar paraspinals around kidney  Pain description: driving fist into back all the time, dull pain that can get sharp  Aggravating factors: turning certain ways, transfers, picking up grandkids, getting in/out of car  Relieving factors: Laying in correct position with pressure against my back   PRECAUTIONS: Fall   WEIGHT BEARING RESTRICTIONS: No  FALLS:  Has patient fallen in last 6 months? Yes. 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 Laurance Flatten in 6 weeks   OBJECTIVE:   DIAGNOSTIC FINDINGS:    PATIENT SURVEYS:  FOTO 51, predicted 56  SCREENING FOR RED FLAGS: Bowel or bladder incontinence: No Spinal tumors: No Cauda equina syndrome: No Compression fracture: No Abdominal aneurysm: No  COGNITION: Overall cognitive status: Within functional  limits for tasks assessed But very talkative     SENSATION: Not tested  MUSCLE LENGTH: Moderately tight HS and piriformis groups B   POSTURE: rounded shoulders, forward head, decreased lumbar lordosis, increased thoracic kyphosis, and flexed trunk   PALPATION: Severe mm spasms noted B lumbar paraspinals, noted "back mouse" lower R lumbar paraspinals   LUMBAR ROM:   AROM 04/17/22  Flexion 30 degrees with pain   Extension 10 degress c pain  Right lateral flexion   Left lateral flexion   Right rotation Limited 75%  Left rotation Limited 75%   (Blank rows = not tested)  LOWER EXTREMITY ROM:     Active  Right  Left   Hip flexion    Hip extension    Hip abduction    Hip adduction    Hip internal rotation    Hip external rotation    Knee flexion    Knee extension     (Blank rows = not tested)  LOWER EXTREMITY MMT:    MMT Right eval Left eval  Hip flexion 4+ 4+  Hip extension    Hip abduction 4+ 4+  Hip adduction    Hip internal rotation    Hip external rotation    Knee flexion 4+ 4+  Knee extension 4+ 4+  Ankle dorsiflexion 5 5  Ankle plantarflexion    Ankle inversion    Ankle eversion     (Blank rows = not tested)  LUMBAR SPECIAL TESTS:    FUNCTIONAL TESTS:  04/19/22:  5 time sit to stand: 17 seconds   GAIT:   TODAY'S TREATMENT:                                                                                                                              DATE:  04/24/22:  Manual:  STM and active trigger point release and skilled palpation for TPDN Trigger Point Dry-Needling  Treatment instructions: Expect mild to moderate muscle soreness. S/S of pneumothorax if dry needled over a lung field, and to seek immediate medical attention should they occur. Patient verbalized understanding of these instructions and education. Patient Consent Given: Yes Education handout provided: Provided previously Muscles treated: Rt lumbar multifidi Treatment  response/outcome: multiple twitch response noted  Modalities:  Moist heat to lumbar prone during supine exercises TherEx:  Prone hip internal and external rotation, trunk rotation x 2 minutes Prone hip extension x 10 each LE alternating Prone hamstring curls x 10 each LE alternating Standing: hip abduction x 10 each LE c UE support Standing calf raises x 15 c UE support Sit to stand: x 10 c UE support Seated hamstring stretch x 3 holding 30 sec using strap  04/19/22:  Manual:  STM and active trigger point release and skilled palpation for TPDN Trigger Point Dry-Needling  Treatment instructions: Expect mild to moderate muscle soreness. S/S of pneumothorax if dry needled over a lung field, and to seek immediate medical attention should they occur. Patient verbalized understanding of these instructions and education.  Patient Consent Given: Yes Education handout provided: Provided previously Muscles treated: bilateral lumbar paraspinals, Rt glute medius and max Treatment response/outcome: multiple twitch response noted, pt reporting feeling less stiff at end of session.  Modalities:  Moist heat to lumbar prone during supine exercises TherEx:  Prone hip internal and external rotation, trunk rotation x 2 minutes Prone hip extension x 10 each LE alternating Prone hamstring curls x 10 each LE alternating Supine PPT: x 15 holding 3 sec Standing: hip abduction x 10 each LE c UE support Standing calf raises x 15 c UE support Sit to stand: x 10 c UE support Seated hamstring stretch x 3 holding 30 sec using strap Seated LAQ x 15 on each LE   04/17/22:  TherEx:  Prone hip internal and external rotation, trunk rotation x 2 minutes Attempted prone on elbows, pt unable to tolerate Supine: hamstring stretch passive x 2 each LE holding 20 sec Supine clam shells: x 20  PPT: 2 x 10 holding 5 sec PPT followed by bridge: x 10  SKTC: x 3 to each side holding 20 sec Manual:  STM and active  trigger point release and skilled palpation for TPDN Trigger Point Dry-Needling  Treatment instructions:  Expect mild to moderate muscle soreness. S/S of pneumothorax if dry needled over a lung field, and to seek immediate medical attention should they occur. Patient verbalized understanding of these instructions and education.  Patient Consent Given: Yes Education handout provided: Yes Muscles treated: bilateral lumbar paraspinals, Rt QL Treatment response/outcome: multiple twitch response noted, pt reporting feeling less stiff at end of session.  Modalities:  Moist heat to lumbar spine during supine exercises   Eval  Objective measures/education     PATIENT EDUCATION:  Education details: exam findings, POC, benefits of water therapy and DN  Person educated: Patient Education method: Explanation Education comprehension: verbalized understanding and needs further education  HOME EXERCISE PROGRAM: Access Code: [email or web address] URL: [email or web address] Date: 04/17/2022 Prepared by: Kearney Hard  Exercises - Supine Posterior Pelvic Tilt  - 2 x daily - 7 x weekly - 2 sets - 10 reps - 5 seconds hold - Supine Bridge  - 2 x daily - 7 x weekly - 2 sets - 10 reps - Hooklying Single Knee to Chest Stretch  - 2 x daily - 7 x weekly - 3 reps - 20 seconds hold - Supine Lower Trunk Rotation with PLB  - 2 x daily - 7 x weekly - 3 reps - 20 seconds hold  ASSESSMENT:  CLINICAL IMPRESSION: Pt reporting good response to DN from last visit. Pt stating DN seems to lessen her pain with her pain more dull now instead of sharp. DN was repeated this visit with twitch response noted in Rt lumbar multifidi. Pt with less stiffness noted at end of treatment. Continue skilled PT to maximize pt's function.    OBJECTIVE IMPAIRMENTS: decreased activity tolerance, decreased balance, decreased mobility, difficulty walking, decreased ROM, decreased strength, hypomobility, increased fascial  restrictions, increased muscle spasms, impaired flexibility, improper body mechanics, postural dysfunction, and pain.   ACTIVITY LIMITATIONS: carrying, lifting, bending, sitting, standing, squatting, stairs, transfers, bed mobility, and locomotion level  PARTICIPATION LIMITATIONS: cleaning, laundry, driving, shopping, community activity, occupation, and church  PERSONAL FACTORS: Age, Behavior pattern, Education, Fitness, Past/current experiences, and Time since onset of injury/illness/exacerbation are also affecting patient's functional outcome.   REHAB POTENTIAL: Fair limited progress with PT in the past   CLINICAL DECISION MAKING: Evolving/moderate complexity  EVALUATION COMPLEXITY: Moderate   GOALS: Goals reviewed with patient? Yes  SHORT TERM GOALS: Target date: 05/01/2022    Will be compliant with appropriate progressive HEP  Baseline: Goal status: On-going 04/24/22  2.  Will be able to perform all transfers with pain no more than 2-3/10 Baseline:  Goal status: On-going 04/24/22  3.  Will improve ability to perform lumbar extension/get to upright posture with 50% reduction in pain  Baseline:  Goal status: On-going 04/24/22  4.  Will demonstrate good mechanics for floor to counter and good mechanics for bed mobility to avoid aggravation of pain  Baseline:  Goal status: On-going 04/24/22    LONG TERM GOALS: Target date: will update LTGs if she makes reasonable progress towards STGs     PLAN:  PT FREQUENCY: 2x/week  PT DURATION: 4 weeks  PLANNED INTERVENTIONS: Therapeutic exercises, Therapeutic activity, Neuromuscular re-education, Balance training, Gait training, Patient/Family education, Self Care, Joint mobilization, Stair training, Orthotic/Fit training, Aquatic Therapy, Dry Needling, Electrical stimulation, Spinal mobilization, Cryotherapy, Moist heat, Taping, Traction, Ultrasound, Ionotophoresis '4mg'$ /ml Dexamethasone, Manual therapy, and Re-evaluation.  PLAN FOR  NEXT SESSION:  assess response to DN, strength, Trial for 4 weeks to see if PT + dry needling improves pain, if not would consider  referral to water [facility] at [facility] as next step    Kearney Hard, PT, MPT 04/24/22 10:23 AM   04/24/2022, 10:23 AM

## 2022-04-26 ENCOUNTER — Ambulatory Visit: Payer: Medicare Other | Admitting: Physical Therapy

## 2022-04-26 ENCOUNTER — Encounter: Payer: Self-pay | Admitting: Physical Therapy

## 2022-04-26 DIAGNOSIS — R293 Abnormal posture: Secondary | ICD-10-CM | POA: Diagnosis not present

## 2022-04-26 DIAGNOSIS — M6281 Muscle weakness (generalized): Secondary | ICD-10-CM

## 2022-04-26 DIAGNOSIS — M5459 Other low back pain: Secondary | ICD-10-CM | POA: Diagnosis not present

## 2022-04-26 DIAGNOSIS — R252 Cramp and spasm: Secondary | ICD-10-CM | POA: Diagnosis not present

## 2022-04-26 NOTE — Therapy (Signed)
OUTPATIENT PHYSICAL THERAPY THORACOLUMBAR      TREATMENT NOTE  Patient Name: Alexa Buchanan MRN: [id number] DOB:November 05, 1950, 72 y.o., female Today's Date: 04/26/2022  END OF SESSION:  PT End of Session - 04/26/22 0959     Visit Number 5    Number of Visits 9    Date for PT Re-Evaluation 05/01/22    Authorization Type BCBS Medicare    Authorization Time Period 04/03/22 to 05/01/22    Progress Note Due on Visit 10    PT Start Time 0930    PT Stop Time 1010    PT Time Calculation (min) 40 min    Activity Tolerance Patient tolerated treatment well    Behavior During Therapy [facility] for tasks assessed/performed               Past Medical History:  Diagnosis Date   Arthritis    Complication of anesthesia    woke up to soon.  reported "bad gag reflex"      Diabetes mellitus    Type 2   Diverticulosis    Dysmetabolic syndrome X    Histoplasmosis 1971   IBS (irritable bowel syndrome)    Liver disease    PBC   Migraines    ice pick headaches, Dr. Beacher May   OSA (obstructive sleep apnea) 07/27/2011   Home sleep study 08/18/11 >> AHI 8.1, SpO2 low 74%.  pt stated she does not have sleep apnes   Seasonal allergies    UTI symptoms    Past Surgical History:  Procedure Laterality Date   BREAST EXCISIONAL BIOPSY Right    BREAST LUMPECTOMY WITH RADIOACTIVE SEED LOCALIZATION Right 07/22/2015   Procedure: BREAST LUMPECTOMY WITH RADIOACTIVE SEED LOCALIZATION;  Surgeon: Coralie Keens, MD;  Location: [place] [facility];  Service: General;  Laterality: Right;   COLONOSCOPY     Tics; Dr Olevia Perches   DILATION AND CURETTAGE OF UTERUS     Gastric Plication  99991111   KNEE ARTHROSCOPY Left 1990   LASER ABLATION OF VASCULAR LESION  2009   Dr. Thornell Mule, wart removed from [place]  2003   with vaginal wall repair and sling   ROTATOR CUFF REPAIR Right 2016   TONSILLECTOMY     1982/1992   VAGINAL HYSTERECTOMY  2003   Vag Hyst,BSO,Sling, posterior repair   VULVA SURGERY     keratoses   WRIST  SURGERY Left 1988    tendon repair   Patient Active Problem List   Diagnosis Date Noted   Malnutrition of moderate degree 01/11/2022   AKI (acute kidney injury) ([place]) 01/09/2022   Hypercalcemia 01/07/2022   Neuropathy 01/03/2022   Low back pain 12/14/2021   Osteoarthritis cervical spine 07/06/2021   Mixed conductive and sensorineural hearing loss of left ear with restricted hearing of right ear 04/07/2021   Pulsatile tinnitus, left ear 02/12/2020   Acute bronchitis 02/10/2014   Smoker 11/12/2013   Uncontrolled type 2 diabetes mellitus with hypoglycemia, with long-term current use of insulin ([place]) 12/03/2012   Elevated blood pressure reading without diagnosis of hypertension 09/04/2012   OSA (obstructive sleep apnea) 07/27/2011   Insomnia 07/27/2011   Wheezing 07/27/2011   Unspecified adverse effect of unspecified drug, medicinal and biological substance 07/04/2011   Vitamin D deficiency 12/29/2010   CERVICAL RADICULOPATHY, RIGHT 04/21/2010   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   DIVERTICULOSIS, COLON [id number]   DYSMETABOLIC SYNDROME X [id number]   IBS 09/10/2007   Uncontrolled secondary diabetes with peripheral neuropathy 02/04/2007   DEPRESSION 02/04/2007  MIGRAINE HEADACHE 02/04/2007   STRESS INCONTINENCE 02/04/2007   HISTOPLASMOSIS, HX OF 02/04/2007   LIVER FUNCTION TESTS, ABNORMAL, HX OF 02/04/2007   COLITIS, HX OF 02/04/2007    PCP: Laurian Brim MD   REFERRING PROVIDER: Callie Fielding, MD  REFERRING DIAG:  M54.41,M54.42,G89.29 (ICD-10-CM) - Chronic left-sided low back pain with bilateral sciatica    Rationale for Evaluation and Treatment: Rehabilitation  THERAPY DIAG:  Other low back pain  Abnormal posture  Muscle weakness (generalized)  Cramp and spasm  ONSET DATE: 03/23/2022  SUBJECTIVE:                                                                                                                                                                                            SUBJECTIVE STATEMENT: Pt stating overall her pain has improved. Pt still reporting pain of 4/10.  Pt stating certain motions make her pain worse.   PERTINENT HISTORY:  Orthopedic Spine Surgery Office Note   Assessment: Patient is a 72 y.o. female with right sided paraspinal muscle pain. TTP in the CVA and just distal to it, but no pain with percussion. Possible that it is related to her kidney stones, but she has seen urology who does not think it is consistent. She has paraspinal fatty atrophy and facet arthrosis are also a possible etiology     Plan: -Explained that initially conservative treatment is tried as a significant number of patients may experience relief with these treatment modalities. Discussed that the conservative treatments include:             -activity modification             -physical therapy             -over the counter pain medications             -medrol dosepak             -steroid injections -Patient has tried activity modification, home exercises  -Norco prescription sent to her pharmacy -Recommended PT (referral provided), voltaren gel with increased pressure over time to desensitize the area, and core strengthening exercises (packet provided) -Would need to quit nicotine-containing products if we were to ever consider surgery as an option -Patient should return to office in 6 weeks, x-rays at next visit: none     Patient expressed understanding of the plan and all questions were answered to the patient's satisfaction.    ___________________________________________________________________________     History:   Patient is a 72 y.o. female who presents today for lumbar spine. Patient has had pain since August 2023. She did not have any falls  or trauma that preceded her pain. Pain is felt on the right side in the regio of the paraspinal muscles just caudal to the ribs. It was severe back in 2023 but has improved. She says that she could  not walk at the time or do anything because of the pain. The pain is now more tolerable but it is still felt on a daily basis. It waxes and wanes in terms of intensity. She has no pain radiating into her legs. She feels like coughing makes it worse but has not noticed anything else that makes it better or worse. She has kidney stones bilaterally but was told by urology that that is not causing her back pain. Denies paresthesias and numbness.       PAIN:  Are you having pain? Yes: NPRS scale: 4/10 Pain location: R lumbar paraspinals around kidney  Pain description: driving fist into back all the time, dull pain that can get sharp  Aggravating factors: turning certain ways, transfers, picking up grandkids, getting in/out of car  Relieving factors: Laying in correct position with pressure against my back   PRECAUTIONS: Fall   WEIGHT BEARING RESTRICTIONS: No  FALLS:  Has patient fallen in last 6 months? Yes. 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 Laurance Flatten in 6 weeks   OBJECTIVE:   DIAGNOSTIC FINDINGS:    PATIENT SURVEYS:  FOTO 51, predicted 29  SCREENING FOR RED FLAGS: Bowel or bladder incontinence: No Spinal tumors: No Cauda equina syndrome: No Compression fracture: No Abdominal aneurysm: No  COGNITION: Overall cognitive status: Within functional limits for tasks assessed But very talkative     SENSATION: Not tested  MUSCLE  LENGTH: Moderately tight HS and piriformis groups B   POSTURE: rounded shoulders, forward head, decreased lumbar lordosis, increased thoracic kyphosis, and flexed trunk   PALPATION: Severe mm spasms noted B lumbar paraspinals, noted "back mouse" lower R lumbar paraspinals   LUMBAR ROM:   AROM 04/17/22 04/26/22  Flexion 30 degrees with pain    Extension 10 degress c pain   Right lateral flexion    Left lateral flexion    Right rotation Limited 75% Limited 50%  Left rotation Limited 75% Limited 50%   (Blank rows = not tested)  LOWER EXTREMITY ROM:     Active  Right  Left   Hip flexion    Hip extension    Hip abduction    Hip adduction    Hip internal rotation    Hip external rotation    Knee flexion    Knee extension     (Blank rows = not tested)  LOWER EXTREMITY MMT:    MMT Right eval Left eval  Hip flexion 4+ 4+  Hip extension    Hip abduction 4+ 4+  Hip adduction    Hip internal rotation    Hip external rotation    Knee flexion 4+ 4+  Knee extension 4+ 4+  Ankle dorsiflexion 5  5  Ankle plantarflexion    Ankle inversion    Ankle eversion     (Blank rows = not tested)  LUMBAR SPECIAL TESTS:    FUNCTIONAL TESTS:  04/19/22:  5 time sit to stand: 17 seconds   GAIT:   TODAY'S TREATMENT:                                                                                                                              DATE:  04/26/22:  Manual:  STM and active trigger point release and skilled palpation for TPDN Trigger Point Dry-Needling  Treatment instructions: Expect mild to moderate muscle soreness. S/S of pneumothorax if dry needled over a lung field, and to seek immediate medical attention should they occur. Patient verbalized understanding of these instructions and education. Patient Consent Given: Yes Education handout provided: Provided previously Muscles treated: Rt lumbar multifidi Treatment response/outcome: multiple twitch response noted  Modalities:   Moist heat alternating to Glutes and Lumbar spine during DN TherEx:  Seated trunk rotation x 5 holding 10 seconds Standing trunk extension x 10 holding 10 sec Seated marching with focus on abdominal activation and posture   04/24/22:  Manual:  STM and active trigger point release and skilled palpation for TPDN STM to Rt glutes and lumbar paraspinals Trigger Point Dry-Needling  Treatment instructions: Expect mild to moderate muscle soreness. S/S of pneumothorax if dry needled over a lung field, and to seek immediate medical attention should they occur. Patient verbalized understanding of these instructions and education. Patient Consent Given: Yes Education handout provided: Provided previously Muscles treated: Rt lumbar multifidi, Rt glutes, Rt piriformis Treatment response/outcome: multiple twitch response noted  Modalities:  Moist heat to lumbar prone during supine exercises TherEx:  Prone hip internal and external rotation, trunk rotation x 2 minutes Prone hip extension x 10 each LE alternating Prone hamstring curls x 10 each LE alternating Standing: hip abduction x 10 each LE c UE support Standing calf raises x 15 c UE support Sit to stand: x 10 c UE support Seated hamstring stretch x 3 holding 30 sec using strap  04/19/22:  Manual:  STM and active trigger point release and skilled palpation for TPDN Trigger Point Dry-Needling  Treatment instructions: Expect mild to moderate muscle soreness. S/S of pneumothorax if dry needled over a lung field, and to seek immediate medical attention should they occur. Patient verbalized understanding of these instructions and education.  Patient Consent Given: Yes Education handout provided: Provided previously Muscles treated: bilateral lumbar paraspinals, Rt glute medius and max Treatment response/outcome: multiple twitch response noted, pt reporting feeling less stiff at end of session.  Modalities:  Moist heat to lumbar prone during  supine exercises TherEx:  Prone hip internal and external rotation, trunk rotation x 2 minutes Prone hip extension x 10 each LE alternating Prone hamstring curls x 10 each LE alternating Supine PPT: x 15 holding 3 sec Standing: hip abduction x 10 each LE c  UE support Standing calf raises x 15 c UE support Sit to stand: x 10 c UE support Seated hamstring stretch x 3 holding 30 sec using strap Seated LAQ x 15 on each LE   04/17/22:  TherEx:  Prone hip internal and external rotation, trunk rotation x 2 minutes Attempted prone on elbows, pt unable to tolerate Supine: hamstring stretch passive x 2 each LE holding 20 sec Supine clam shells: x 20  PPT: 2 x 10 holding 5 sec PPT followed by bridge: x 10  SKTC: x 3 to each side holding 20 sec Manual:  STM and active trigger point release and skilled palpation for TPDN Trigger Point Dry-Needling  Treatment instructions: Expect mild to moderate muscle soreness. S/S of pneumothorax if dry needled over a lung field, and to seek immediate medical attention should they occur. Patient verbalized understanding of these instructions and education.  Patient Consent Given: Yes Education handout provided: Yes Muscles treated: bilateral lumbar paraspinals, Rt QL Treatment response/outcome: multiple twitch response noted, pt reporting feeling less stiff at end of session.  Modalities:  Moist heat to lumbar spine during supine exercises   Eval  Objective measures/education     PATIENT EDUCATION:  Education details: exam findings, POC, benefits of water therapy and DN  Person educated: Patient Education method: Explanation Education comprehension: verbalized understanding and needs further education  HOME EXERCISE PROGRAM: Access Code: [email or web address] URL: [email or web address] Date: 04/17/2022 Prepared by: Kearney Hard  Exercises - Supine Posterior Pelvic Tilt  - 2 x daily - 7 x weekly - 2 sets - 10 reps - 5 seconds hold -  Supine Bridge  - 2 x daily - 7 x weekly - 2 sets - 10 reps - Hooklying Single Knee to Chest Stretch  - 2 x daily - 7 x weekly - 3 reps - 20 seconds hold - Supine Lower Trunk Rotation with PLB  - 2 x daily - 7 x weekly - 3 reps - 20 seconds hold  ASSESSMENT:  CLINICAL IMPRESSION: Pt reporting improvements of 50% since starting therapy. Pt wishing to take several weeks off and try massage and see how it goes on her own using her HEP. Pt has met 3 of her 4 STG's. Pt is being placed on hold.    OBJECTIVE IMPAIRMENTS: decreased activity tolerance, decreased balance, decreased mobility, difficulty walking, decreased ROM, decreased strength, hypomobility, increased fascial restrictions, increased muscle spasms, impaired flexibility, improper body mechanics, postural dysfunction, and pain.   ACTIVITY LIMITATIONS: carrying, lifting, bending, sitting, standing, squatting, stairs, transfers, bed mobility, and locomotion level  PARTICIPATION LIMITATIONS: cleaning, laundry, driving, shopping, community activity, occupation, and church  PERSONAL FACTORS: Age, Behavior pattern, Education, Fitness, Past/current experiences, and Time since onset of injury/illness/exacerbation are also affecting patient's functional outcome.   REHAB POTENTIAL: Fair limited progress with PT in the past   CLINICAL DECISION MAKING: Evolving/moderate complexity  EVALUATION COMPLEXITY: Moderate   GOALS: Goals reviewed with patient? Yes  SHORT TERM GOALS: Target date: 05/01/2022    Will be compliant with appropriate progressive HEP  Baseline: Goal status: Met 04/26/22  2.  Will be able to perform all transfers with pain no more than 2-3/10 Baseline:  Goal status: On-going 04/26/22  3.  Will improve ability to perform lumbar extension/get to upright posture with 50% reduction in pain  Baseline:  Goal status: Met 04/26/22  4.  Will demonstrate good mechanics for floor to counter and good mechanics for bed mobility to  avoid aggravation of pain  Baseline:  Goal  status: MET 04/26/22    LONG TERM GOALS: Target date: will update LTGs if she makes reasonable progress towards STGs     PLAN:  PT FREQUENCY: 2x/week  PT DURATION: 4 weeks  PLANNED INTERVENTIONS: Therapeutic exercises, Therapeutic activity, Neuromuscular re-education, Balance training, Gait training, Patient/Family education, Self Care, Joint mobilization, Stair training, Orthotic/Fit training, Aquatic Therapy, Dry Needling, Electrical stimulation, Spinal mobilization, Cryotherapy, Moist heat, Taping, Traction, Ultrasound, Ionotophoresis '4mg'$ /ml Dexamethasone, Manual therapy, and Re-evaluation.  PLAN FOR NEXT SESSION:  Place on hold    Connell, [place], MPT 04/26/22 11:03 AM   04/26/2022, 11:03 AM

## 2022-05-01 ENCOUNTER — Encounter: Payer: Medicare Other | Admitting: Physical Therapy

## 2022-05-02 NOTE — Progress Notes (Signed)
72 y.o. G22P2012 Married Caucasian female here for annual exam.    No gynecologic issues.   Taking Ditropan XL 15 mg daily.  Dr. Amalia Hailey prescribing.  Is currently working.  Has some dry mouth.  Dealt with illness last year, and is still recovering.  New dx of hypercalcemia and elevated creatinine.  PTH normal.  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  Had yeast infections with use of Jardiance, which she no longer takes.   PCP:  Velna Hatchet, MD  Patient's last menstrual period was 02/28/1995.           Sexually active: No.  The current method of family planning is status post hysterectomy.    Exercising: Yes.     Walking Smoker:  yes  Health Maintenance: Pap:  10-16-13 Neg, 10-06-10 Neg  History of abnormal Pap:  no MMG:  02/17/22 Breast Density Category C, BI-RADS CATEGORY 1 Neg Colonoscopy:  12/28/18 per pt BMD:   06/22/20  Result  normal TDaP:  06/16/10 Gardasil:   no HIV: n/a Hep C: n/a Screening Labs:  PCP   reports that she has been smoking cigarettes. She has a 30.00 pack-year smoking history. She has never used smokeless tobacco. She reports that she does not drink alcohol and does not use drugs.  Past Medical History:  Diagnosis Date   Arthritis    Complication of anesthesia    woke up to soon.  reported "bad gag reflex"      Diabetes mellitus    Type 2   Diverticulosis    Dysmetabolic syndrome X    Histoplasmosis 1971   IBS (irritable bowel syndrome)    Liver disease    PBC   Migraines    ice pick headaches, Dr. Beacher May   OSA (obstructive sleep apnea) 07/27/2011   Home sleep study 08/18/11 >> AHI 8.1, SpO2 low 74%.  pt stated she does not have sleep apnes   Primary biliary cholangitis ([facility])    Seasonal allergies    UTI symptoms     Past Surgical History:  Procedure Laterality Date   BREAST EXCISIONAL BIOPSY Right    BREAST LUMPECTOMY WITH RADIOACTIVE SEED LOCALIZATION Right 07/22/2015   Procedure:  BREAST LUMPECTOMY WITH RADIOACTIVE SEED LOCALIZATION;  Surgeon: Coralie Keens, MD;  Location: [facility];  Service: General;  Laterality: Right;   COLONOSCOPY     Tics; Dr Olevia Perches   DILATION AND CURETTAGE OF UTERUS     Gastric Plication  99991111   KNEE ARTHROSCOPY Left 1990   LASER ABLATION OF VASCULAR LESION  2009   Dr. Thornell Mule, wart removed from [place]  2003   with vaginal wall repair and sling   ROTATOR CUFF REPAIR Right 2016   TONSILLECTOMY     1982/1992   VAGINAL HYSTERECTOMY  2003   Vag Hyst,BSO,Sling, posterior repair   VULVA SURGERY     keratoses   WRIST SURGERY Left 1988    tendon repair    Current Outpatient Medications  Medication Sig Dispense Refill   aspirin EC 81 MG tablet Take 81 mg by mouth daily.     cetirizine (ZYRTEC) 10 MG tablet Take 10 mg by mouth daily.     glucose blood (ONE TOUCH ULTRA TEST) test strip CHECK BLOOD SUGAR TWICE A DAY AS DIRECTED. 50 each 6   Lancets (ONETOUCH DELICA PLUS 123XX123) MISC Apply topically 4 (four) times daily.     LINZESS 72 MCG capsule 1 capsule at least  30 minutes before the first meal of the day on an empty stomach Orally Once a day for 30 day(s)     oxybutynin (DITROPAN XL) 15 MG 24 hr tablet TAKE 1 TABLET DAILY.     ursodiol (ACTIGALL) 300 MG capsule Take one capsule (300 mg) by mouth in the morning and two capsules (600 mg) by mouth in the evening     zolpidem (AMBIEN) 10 MG tablet Take 5 mg by mouth as needed for sleep.  (Patient not taking: Reported on 05/16/2022)     No current facility-administered medications for this visit.    Family History  Problem Relation Age of Onset   Leukemia Father        CLL   Hyperlipidemia Mother    Multiple sclerosis Mother    Hypertension Mother    Diabetes Maternal Grandmother        also had malignant lower neck mass of unknown etiology   Breast cancer Sister        age 76   Hypertension Sister        related to migraines   Migraines Sister    Stroke Maternal  Grandfather 74   Heart disease Maternal Aunt        porcine valve   Heart disease Maternal Uncle        valvular disease   Colon cancer Neg Hx    Stomach cancer Neg Hx    Pancreatic cancer Neg Hx    Esophageal cancer Neg Hx     Review of Systems  All other systems reviewed and are negative.   Exam:   BP 122/68 (BP Location: Left Arm, Patient Position: Sitting, Cuff Size: Normal)   Pulse 86   Ht 5' 3.5" (1.613 m)   Wt 148 lb (67.1 kg)   LMP 02/28/1995   SpO2 97%   BMI 25.81 kg/m     General appearance: alert, cooperative and appears stated age Head: normocephalic, without obvious abnormality, atraumatic Neck: no adenopathy, supple, symmetrical, trachea midline and thyroid normal to inspection and palpation Lungs: clear to auscultation bilaterally Breasts: normal appearance, no masses or tenderness, No nipple retraction or dimpling, No nipple discharge or bleeding, No axillary adenopathy Heart: regular rate and rhythm Abdomen: soft, non-tender; no masses, no organomegaly Extremities: extremities normal, atraumatic, no cyanosis or edema Skin: skin color, texture, turgor normal. No rashes or lesions Lymph nodes: cervical, supraclavicular, and axillary nodes normal. Neurologic: grossly normal  Pelvic: External genitalia:  no lesions              No abnormal inguinal nodes palpated.              Urethra:  normal appearing urethra with no masses, tenderness or lesions              Bartholins and Skenes: normal                 Vagina: normal appearing vagina with normal color and discharge, no lesions              Cervix: absent              Pap taken: no Bimanual Exam:  Uterus: absent              Adnexa: no mass, fullness, tenderness              Rectal exam: yes.  Confirms.              Anus:  normal  sphincter tone, no lesions  Chaperone was present for exam:  Raquel Sarna  Assessment:   Well woman visit with gynecologic exam. Status post TVH/BSO/sling/posterior colporrhaphy  2003.  Overactive bladder.  On Ditropan XL.  Hx right breast biopsy.  Primary biliary colangitis (PBC). Osteoporosis screening.   Plan: Mammogram screening discussed. Self breast awareness reviewed. Pap and HR HPV as above. Guidelines for Calcium, Vitamin D, regular exercise program including cardiovascular and weight bearing exercise. We reviewed PBC guildines for bone density testing in Up to Date.  BMD can be done every 2 - 3 years if patient has normal bone density and risk factors for osteoporosis.  BMD will be done this year at this office.  Follow up annually and prn.   After visit summary provided.   20 min  total time was spent for this patient encounter, including preparation, face-to-face counseling with the patient, coordination of care, and documentation of the encounter in addition to doing breast and pelvic exam.   Time spent doing review of medical history and research regarding her bone density care given her dx of primary biliary colangitis.

## 2022-05-03 ENCOUNTER — Encounter: Payer: Medicare Other | Admitting: Physical Therapy

## 2022-05-04 ENCOUNTER — Ambulatory Visit: Payer: Medicare Other | Admitting: Orthopedic Surgery

## 2022-05-04 DIAGNOSIS — G8929 Other chronic pain: Secondary | ICD-10-CM | POA: Diagnosis not present

## 2022-05-04 DIAGNOSIS — M545 Low back pain, unspecified: Secondary | ICD-10-CM | POA: Diagnosis not present

## 2022-05-04 NOTE — Progress Notes (Signed)
Orthopedic Spine Surgery Office Note  Assessment: Patient is a 72 y.o. female with right-sided upper lumbar pain in the area of the costovertebral angle.  No radicular symptoms   Plan: -Patient has tried  activity modification, home exercises, PT, CBD -Patient inquired about acupuncture.  She has had previous success with that.  I advised her that it was worth trying and if she gets relief she can keep going -I told her that the neck step from my perspective would be a diagnostic injection to the lower lumbar facets.  Not sure that the pain is coming from there though because its located higher on her lumbar spine than the facets with arthropathy and its more lateral -She should continue with the physical therapy home exercise program -She is going to continue her workup with either nephrology or urology.  She is going to talk to her primary care doctor about where to go next -Patient should return to office on an as-needed basis   Patient expressed understanding of the plan and all questions were answered to the patient's satisfaction.   ___________________________________________________________________________  History: Patient is a 72 y.o. female who has been previously seen in the office for symptoms of right-sided upper lumbar pain in the region of the paraspinal muscles.  It is located just caudal to the ribs.  At her last visit, I recommend physical therapy for core strengthening, lumbar stabilization, and desensitization.  She feels that she has gotten better but is still having significant pain in the same area.  She has no pain rating down the lower extremities.  She has seen an endocrinologist who feels that is likely related to the kidney.  Denies paresthesia numbness.  Previous treatments: activity modification, home exercises, PT, CBD  Physical Exam:  General: no acute distress, appears stated age Neurologic: alert, answering questions appropriately, following  commands Respiratory: unlabored breathing on room air, symmetric chest rise Psychiatric: appropriate affect, normal cadence to speech   MSK (spine):  -Strength exam      Left  Right EHL    5/5  5/5 TA    5/5  5/5 GSC    5/5  5/5 Knee extension  5/5  5/5 Hip flexion   5/5  5/5  -Sensory exam    Sensation intact to light touch in L3-S1 nerve distributions of bilateral lower extremities   Imaging: XR of the lumbar spine from 03/23/2022 was previously independently reviewed and interpreted, showing disc height loss at multiple levels. No fracture or dislocation. No evidence of instability on flexion/extension views.    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 Lateral recess stenosis at L4/5. Hypertrophic and degenerative changes within the facets at multiple levels. Fatty atrophy noted within the paraspinal muscles.    Patient name: Alexa Buchanan Patient MRN: [id number] Date of visit: 05/04/22

## 2022-05-05 DIAGNOSIS — K743 Primary biliary cirrhosis: Secondary | ICD-10-CM | POA: Diagnosis not present

## 2022-05-05 DIAGNOSIS — I10 Essential (primary) hypertension: Secondary | ICD-10-CM | POA: Diagnosis not present

## 2022-05-05 DIAGNOSIS — E1169 Type 2 diabetes mellitus with other specified complication: Secondary | ICD-10-CM | POA: Diagnosis not present

## 2022-05-05 DIAGNOSIS — F5101 Primary insomnia: Secondary | ICD-10-CM | POA: Diagnosis not present

## 2022-05-16 ENCOUNTER — Encounter: Payer: Self-pay | Admitting: Obstetrics and Gynecology

## 2022-05-16 ENCOUNTER — Ambulatory Visit (INDEPENDENT_AMBULATORY_CARE_PROVIDER_SITE_OTHER): Payer: Medicare Other | Admitting: Obstetrics and Gynecology

## 2022-05-16 VITALS — BP 122/68 | HR 86 | Ht 63.5 in | Wt 148.0 lb

## 2022-05-16 DIAGNOSIS — Z01419 Encounter for gynecological examination (general) (routine) without abnormal findings: Secondary | ICD-10-CM | POA: Diagnosis not present

## 2022-05-16 DIAGNOSIS — Z13820 Encounter for screening for osteoporosis: Secondary | ICD-10-CM | POA: Diagnosis not present

## 2022-05-24 DIAGNOSIS — E119 Type 2 diabetes mellitus without complications: Secondary | ICD-10-CM | POA: Diagnosis not present

## 2022-05-24 DIAGNOSIS — Z961 Presence of intraocular lens: Secondary | ICD-10-CM | POA: Diagnosis not present

## 2022-05-24 DIAGNOSIS — H43811 Vitreous degeneration, right eye: Secondary | ICD-10-CM | POA: Diagnosis not present

## 2022-05-24 DIAGNOSIS — H04123 Dry eye syndrome of bilateral lacrimal glands: Secondary | ICD-10-CM | POA: Diagnosis not present

## 2022-06-07 ENCOUNTER — Encounter: Payer: Self-pay | Admitting: Physical Therapy

## 2022-06-07 NOTE — Therapy (Signed)
PHYSICAL THERAPY DISCHARGE SUMMARY  Visits from Start of Care: 5  Current functional level related to goals / functional outcomes: Inactive for >30 days, DC per clinic policy    Remaining deficits: Unable to assess    Education / Equipment: Unable to assess    Patient agrees to discharge. Patient goals were partially met. Patient is being discharged due to not returning since the last visit.   Nedra Hai PT DPT PN2

## 2022-06-23 DIAGNOSIS — D2272 Melanocytic nevi of left lower limb, including hip: Secondary | ICD-10-CM | POA: Diagnosis not present

## 2022-06-23 DIAGNOSIS — L820 Inflamed seborrheic keratosis: Secondary | ICD-10-CM | POA: Diagnosis not present

## 2022-06-23 DIAGNOSIS — L821 Other seborrheic keratosis: Secondary | ICD-10-CM | POA: Diagnosis not present

## 2022-06-23 DIAGNOSIS — L650 Telogen effluvium: Secondary | ICD-10-CM | POA: Diagnosis not present

## 2022-06-23 DIAGNOSIS — L720 Epidermal cyst: Secondary | ICD-10-CM | POA: Diagnosis not present

## 2022-07-21 DIAGNOSIS — H903 Sensorineural hearing loss, bilateral: Secondary | ICD-10-CM | POA: Diagnosis not present

## 2022-07-21 DIAGNOSIS — H93A2 Pulsatile tinnitus, left ear: Secondary | ICD-10-CM | POA: Diagnosis not present

## 2022-07-29 DIAGNOSIS — E119 Type 2 diabetes mellitus without complications: Secondary | ICD-10-CM | POA: Diagnosis not present

## 2022-08-16 DIAGNOSIS — K080 Exfoliation of teeth due to systemic causes: Secondary | ICD-10-CM | POA: Diagnosis not present

## 2022-08-23 DIAGNOSIS — K080 Exfoliation of teeth due to systemic causes: Secondary | ICD-10-CM | POA: Diagnosis not present

## 2022-08-28 DIAGNOSIS — E119 Type 2 diabetes mellitus without complications: Secondary | ICD-10-CM | POA: Diagnosis not present

## 2022-09-28 DIAGNOSIS — E119 Type 2 diabetes mellitus without complications: Secondary | ICD-10-CM | POA: Diagnosis not present

## 2022-10-02 DIAGNOSIS — H9120 Sudden idiopathic hearing loss, unspecified ear: Secondary | ICD-10-CM | POA: Diagnosis not present

## 2022-10-02 DIAGNOSIS — Z0110 Encounter for examination of ears and hearing without abnormal findings: Secondary | ICD-10-CM | POA: Diagnosis not present

## 2022-10-24 DIAGNOSIS — K743 Primary biliary cirrhosis: Secondary | ICD-10-CM | POA: Diagnosis not present

## 2022-10-29 DIAGNOSIS — E119 Type 2 diabetes mellitus without complications: Secondary | ICD-10-CM | POA: Diagnosis not present

## 2022-11-10 DIAGNOSIS — I10 Essential (primary) hypertension: Secondary | ICD-10-CM | POA: Diagnosis not present

## 2022-11-10 DIAGNOSIS — E1169 Type 2 diabetes mellitus with other specified complication: Secondary | ICD-10-CM | POA: Diagnosis not present

## 2022-11-10 DIAGNOSIS — E875 Hyperkalemia: Secondary | ICD-10-CM | POA: Diagnosis not present

## 2022-11-17 ENCOUNTER — Other Ambulatory Visit: Payer: Self-pay | Admitting: Internal Medicine

## 2022-11-17 DIAGNOSIS — Z23 Encounter for immunization: Secondary | ICD-10-CM | POA: Diagnosis not present

## 2022-11-17 DIAGNOSIS — I10 Essential (primary) hypertension: Secondary | ICD-10-CM | POA: Diagnosis not present

## 2022-11-17 DIAGNOSIS — F17200 Nicotine dependence, unspecified, uncomplicated: Secondary | ICD-10-CM | POA: Diagnosis not present

## 2022-11-17 DIAGNOSIS — Z0000 Encounter for general adult medical examination without abnormal findings: Secondary | ICD-10-CM | POA: Diagnosis not present

## 2022-11-17 DIAGNOSIS — Z1331 Encounter for screening for depression: Secondary | ICD-10-CM | POA: Diagnosis not present

## 2022-11-17 DIAGNOSIS — J438 Other emphysema: Secondary | ICD-10-CM | POA: Diagnosis not present

## 2022-11-17 DIAGNOSIS — Z1339 Encounter for screening examination for other mental health and behavioral disorders: Secondary | ICD-10-CM | POA: Diagnosis not present

## 2022-11-17 DIAGNOSIS — R82998 Other abnormal findings in urine: Secondary | ICD-10-CM | POA: Diagnosis not present

## 2022-11-17 DIAGNOSIS — E669 Obesity, unspecified: Secondary | ICD-10-CM | POA: Diagnosis not present

## 2022-11-28 DIAGNOSIS — E119 Type 2 diabetes mellitus without complications: Secondary | ICD-10-CM | POA: Diagnosis not present

## 2022-12-02 DIAGNOSIS — Z23 Encounter for immunization: Secondary | ICD-10-CM | POA: Diagnosis not present

## 2022-12-13 ENCOUNTER — Encounter: Payer: Self-pay | Admitting: Internal Medicine

## 2022-12-15 ENCOUNTER — Ambulatory Visit
Admission: RE | Admit: 2022-12-15 | Discharge: 2022-12-15 | Disposition: A | Discharge status: Home / Self Care | Payer: Medicare Other | Source: Ambulatory Visit | Attending: Internal Medicine | Admitting: Internal Medicine

## 2022-12-15 DIAGNOSIS — Z0000 Encounter for general adult medical examination without abnormal findings: Secondary | ICD-10-CM

## 2022-12-15 DIAGNOSIS — F17210 Nicotine dependence, cigarettes, uncomplicated: Secondary | ICD-10-CM | POA: Diagnosis not present

## 2022-12-21 ENCOUNTER — Other Ambulatory Visit: Payer: Self-pay | Admitting: Obstetrics and Gynecology

## 2022-12-21 DIAGNOSIS — Z1231 Encounter for screening mammogram for malignant neoplasm of breast: Secondary | ICD-10-CM

## 2022-12-29 DIAGNOSIS — E119 Type 2 diabetes mellitus without complications: Secondary | ICD-10-CM | POA: Diagnosis not present

## 2023-01-28 DIAGNOSIS — E119 Type 2 diabetes mellitus without complications: Secondary | ICD-10-CM | POA: Diagnosis not present

## 2023-02-19 ENCOUNTER — Ambulatory Visit
Admission: RE | Admit: 2023-02-19 | Discharge: 2023-02-19 | Disposition: A | Discharge status: Home / Self Care | Payer: Medicare Other | Source: Ambulatory Visit | Attending: Obstetrics and Gynecology | Admitting: Obstetrics and Gynecology

## 2023-02-19 DIAGNOSIS — Z1231 Encounter for screening mammogram for malignant neoplasm of breast: Secondary | ICD-10-CM | POA: Diagnosis not present

## 2023-02-27 ENCOUNTER — Encounter: Payer: Self-pay | Admitting: Obstetrics and Gynecology

## 2023-02-28 DIAGNOSIS — E119 Type 2 diabetes mellitus without complications: Secondary | ICD-10-CM | POA: Diagnosis not present

## 2023-03-01 ENCOUNTER — Other Ambulatory Visit: Payer: Self-pay | Admitting: Obstetrics and Gynecology

## 2023-03-01 DIAGNOSIS — R928 Other abnormal and inconclusive findings on diagnostic imaging of breast: Secondary | ICD-10-CM

## 2023-03-06 ENCOUNTER — Ambulatory Visit
Admission: RE | Admit: 2023-03-06 | Discharge: 2023-03-06 | Disposition: A | Discharge status: Home / Self Care | Payer: Medicare Other | Source: Ambulatory Visit | Attending: Obstetrics and Gynecology | Admitting: Obstetrics and Gynecology

## 2023-03-06 ENCOUNTER — Other Ambulatory Visit: Payer: Self-pay | Admitting: Obstetrics and Gynecology

## 2023-03-06 DIAGNOSIS — R921 Mammographic calcification found on diagnostic imaging of breast: Secondary | ICD-10-CM

## 2023-03-06 DIAGNOSIS — R928 Other abnormal and inconclusive findings on diagnostic imaging of breast: Secondary | ICD-10-CM

## 2023-03-08 ENCOUNTER — Ambulatory Visit
Admission: RE | Admit: 2023-03-08 | Discharge: 2023-03-08 | Disposition: A | Discharge status: Home / Self Care | Payer: Medicare Other | Source: Ambulatory Visit | Attending: Obstetrics and Gynecology | Admitting: Obstetrics and Gynecology

## 2023-03-08 DIAGNOSIS — N6032 Fibrosclerosis of left breast: Secondary | ICD-10-CM | POA: Diagnosis not present

## 2023-03-08 DIAGNOSIS — N6322 Unspecified lump in the left breast, upper inner quadrant: Secondary | ICD-10-CM | POA: Diagnosis not present

## 2023-03-08 DIAGNOSIS — R921 Mammographic calcification found on diagnostic imaging of breast: Secondary | ICD-10-CM

## 2023-03-08 HISTORY — PX: BREAST BIOPSY: SHX20

## 2023-03-09 LAB — SURGICAL PATHOLOGY

## 2023-03-31 DIAGNOSIS — E119 Type 2 diabetes mellitus without complications: Secondary | ICD-10-CM | POA: Diagnosis not present

## 2023-04-09 DIAGNOSIS — I10 Essential (primary) hypertension: Secondary | ICD-10-CM | POA: Diagnosis not present

## 2023-04-16 DIAGNOSIS — K224 Dyskinesia of esophagus: Secondary | ICD-10-CM | POA: Diagnosis not present

## 2023-04-16 DIAGNOSIS — K743 Primary biliary cirrhosis: Secondary | ICD-10-CM | POA: Diagnosis not present

## 2023-04-28 DIAGNOSIS — E119 Type 2 diabetes mellitus without complications: Secondary | ICD-10-CM | POA: Diagnosis not present

## 2023-04-28 IMAGING — MG MM DIGITAL DIAGNOSTIC UNILAT*R* W/ TOMO W/ CAD
6 series · 6 of 14 positions shown · non-contrast
Comparison: Previous exam(s).

CLINICAL DATA: Patient was called back for a right breast asymmetry
and right breast calcifications.

EXAM:
DIGITAL DIAGNOSTIC UNILATERAL RIGHT MAMMOGRAM WITH TOMOSYNTHESIS AND
CAD
TECHNIQUE: Right digital diagnostic mammography and breast tomosynthesis was
performed. The images were evaluated with computer-aided detection.

[R ML]
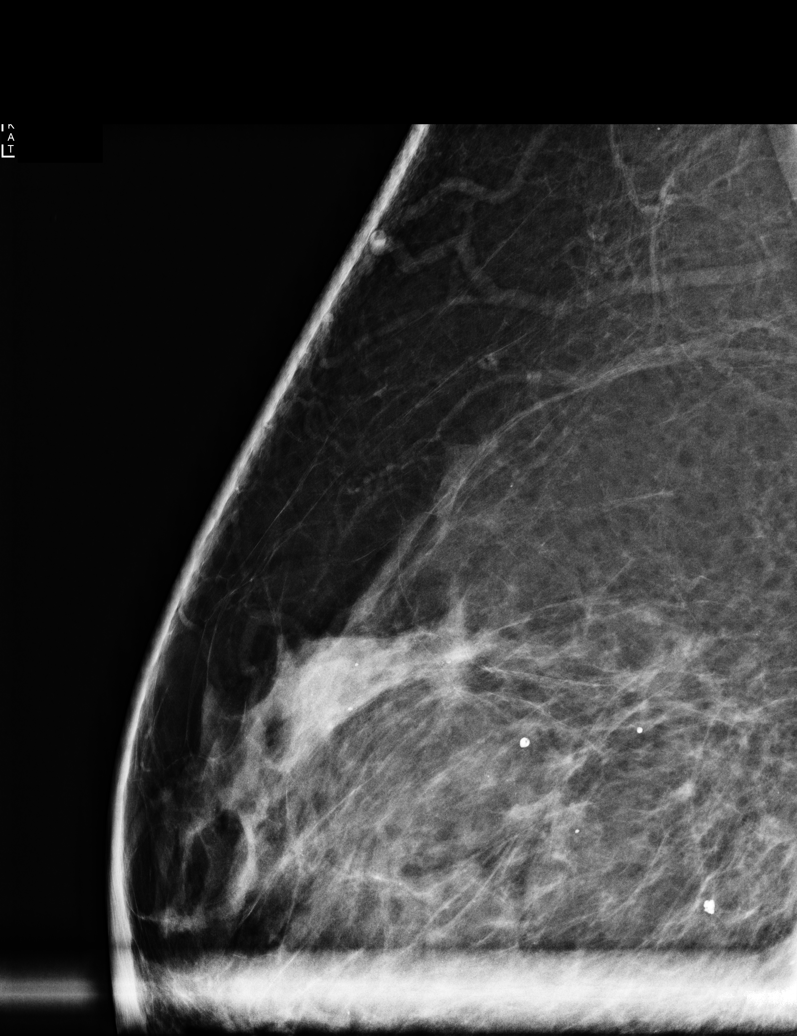

[R CC]
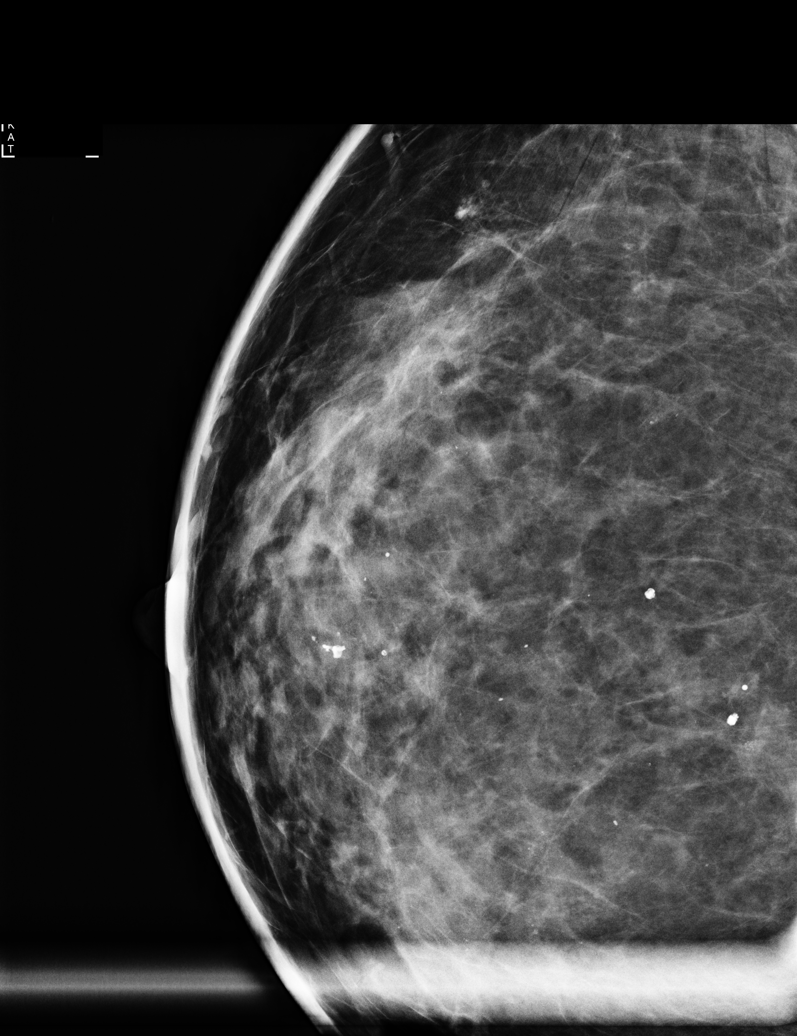

[R ML synth-2D]
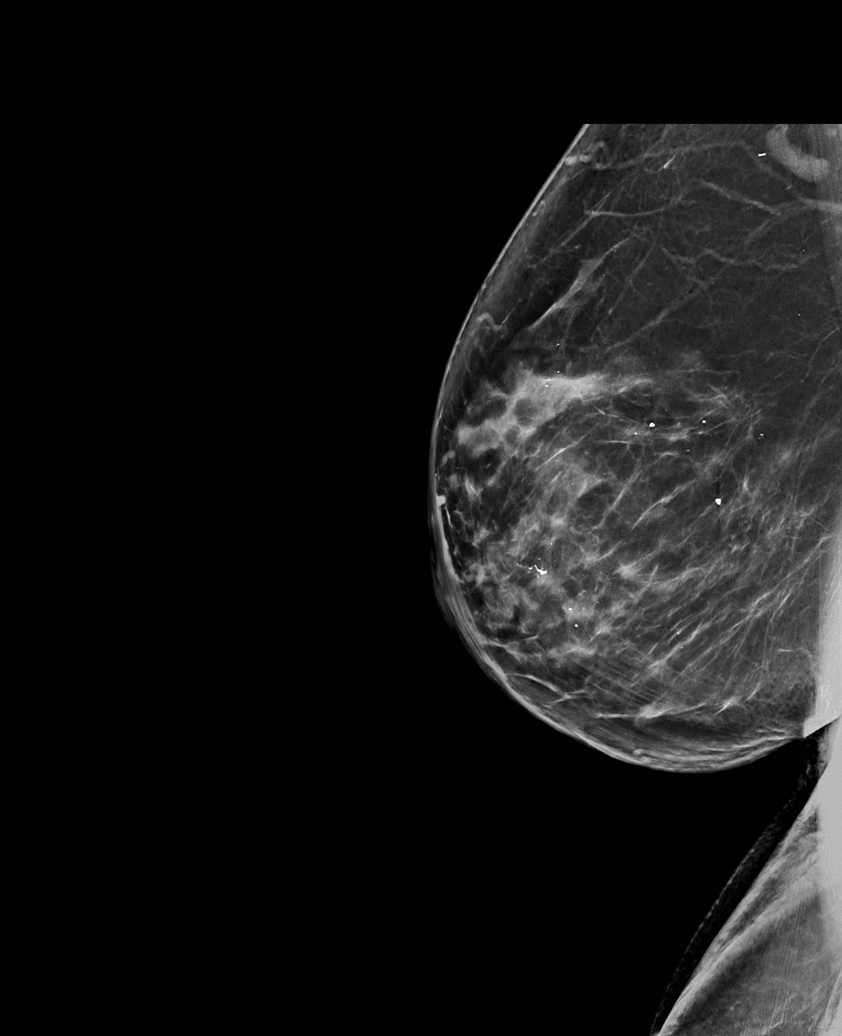

[R MLO synth-2D]
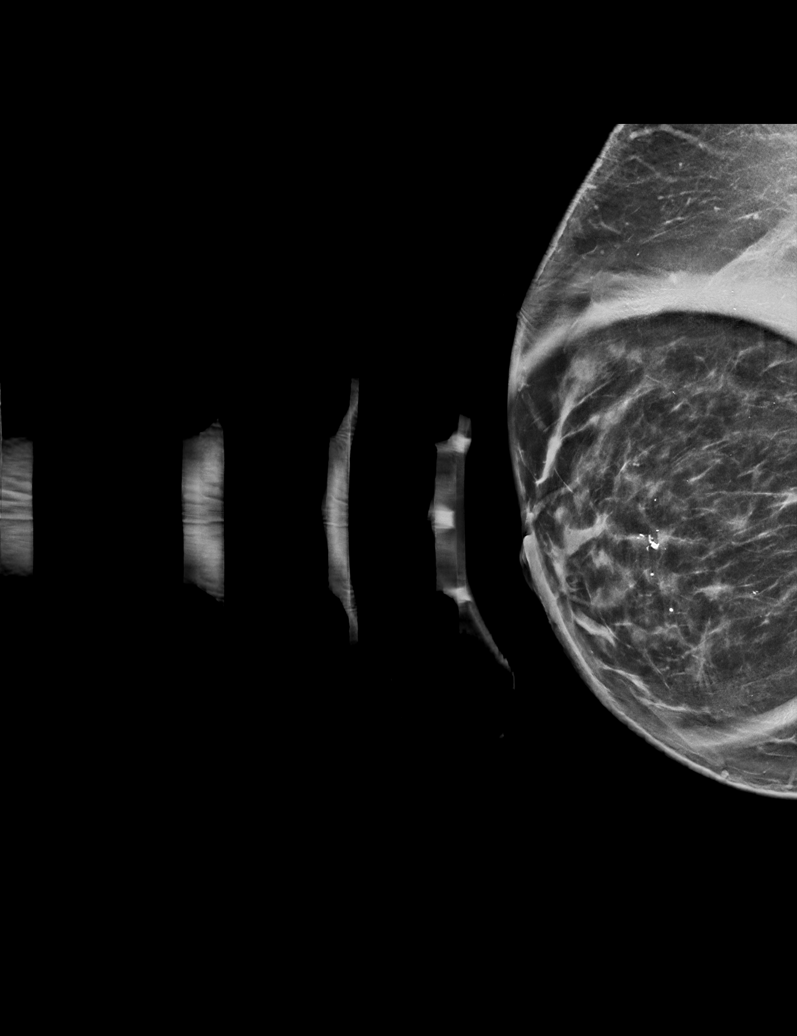

[R MLO tomo · tomo slice 39/76.0]
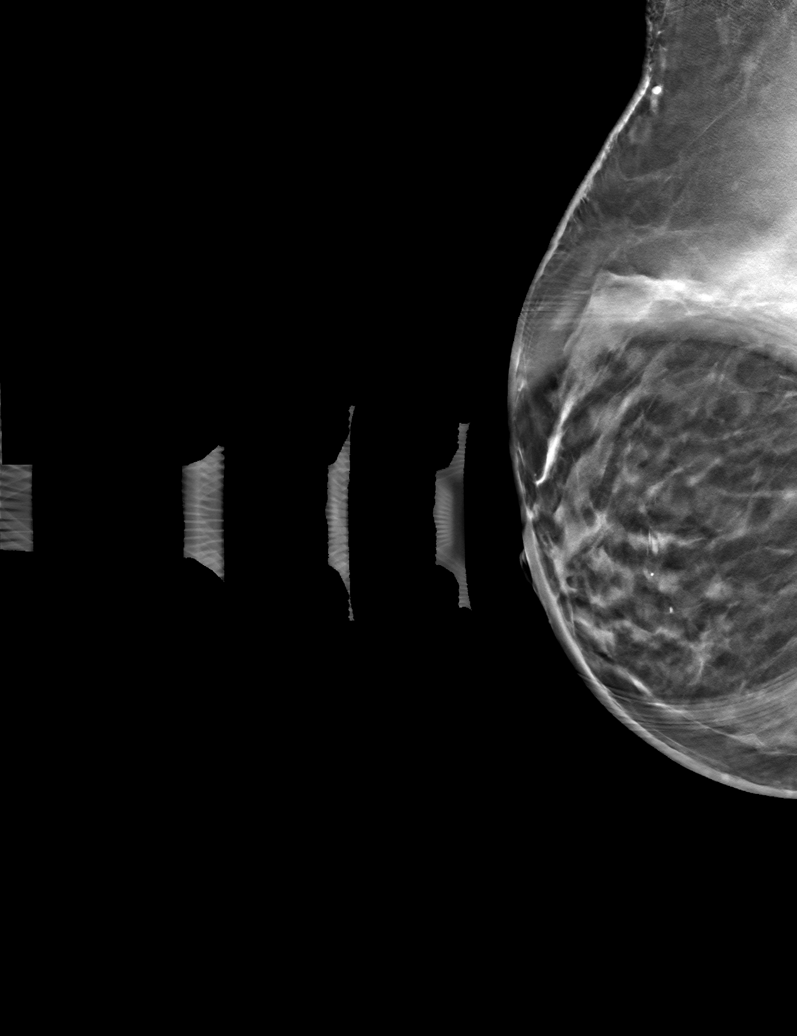

[R ML tomo · tomo slice 45/89.0]
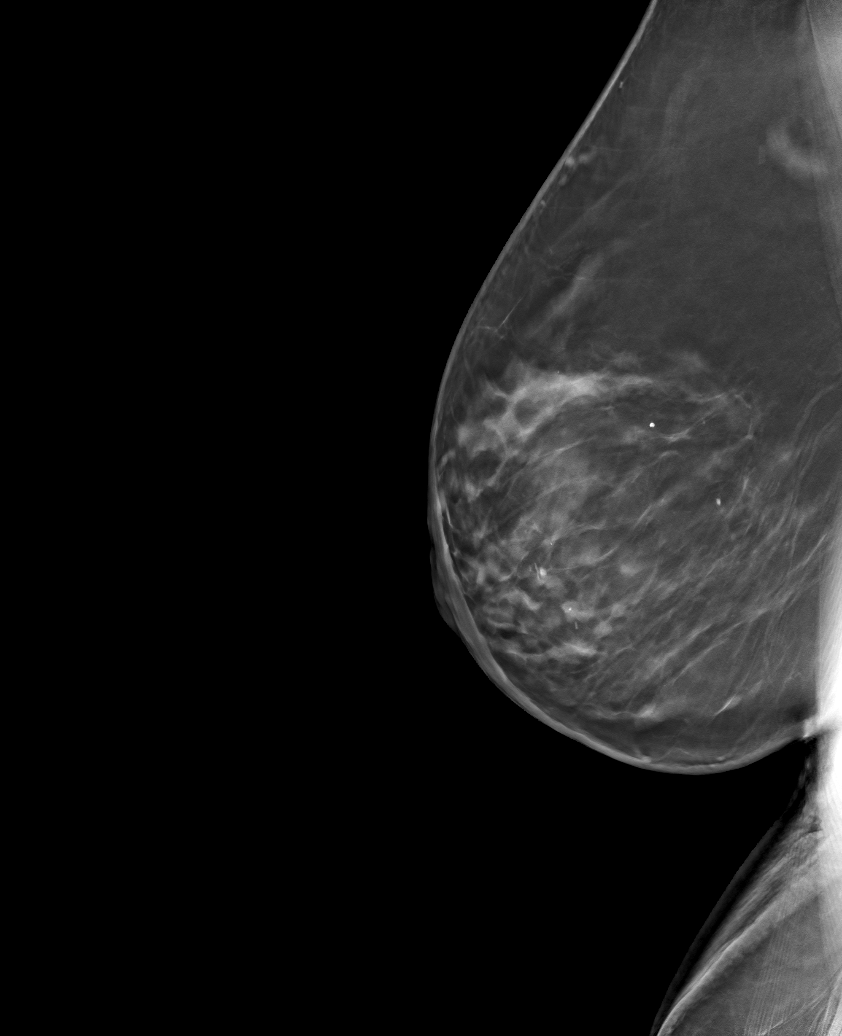

[6 of 14 positions shown; findings below may reference images not displayed]

ACR Breast Density Category c: The breast tissue is heterogeneously
dense, which may obscure small masses.
FINDINGS: The asymmetry in the right breast resolves on additional imaging.
The calcifications in the superior right breast are scattered and
benign in appearance without significant change compared to previous
years.
IMPRESSION: No evidence of malignancy in the right breast.

RECOMMENDATION:
Annual screening mammography.

I have discussed the findings and recommendations with the patient.
If applicable, a reminder letter will be sent to the patient
regarding the next appointment.

BI-RADS CATEGORY  2: Benign.

## 2023-05-29 DIAGNOSIS — E119 Type 2 diabetes mellitus without complications: Secondary | ICD-10-CM | POA: Diagnosis not present

## 2023-05-30 DIAGNOSIS — H43811 Vitreous degeneration, right eye: Secondary | ICD-10-CM | POA: Diagnosis not present

## 2023-05-30 DIAGNOSIS — E119 Type 2 diabetes mellitus without complications: Secondary | ICD-10-CM | POA: Diagnosis not present

## 2023-05-30 DIAGNOSIS — H04123 Dry eye syndrome of bilateral lacrimal glands: Secondary | ICD-10-CM | POA: Diagnosis not present

## 2023-06-28 DIAGNOSIS — E119 Type 2 diabetes mellitus without complications: Secondary | ICD-10-CM | POA: Diagnosis not present

## 2023-07-24 ENCOUNTER — Other Ambulatory Visit: Payer: Self-pay | Admitting: Obstetrics and Gynecology

## 2023-07-24 DIAGNOSIS — R921 Mammographic calcification found on diagnostic imaging of breast: Secondary | ICD-10-CM

## 2023-07-27 DIAGNOSIS — E8881 Metabolic syndrome: Secondary | ICD-10-CM | POA: Diagnosis not present

## 2023-07-27 DIAGNOSIS — H903 Sensorineural hearing loss, bilateral: Secondary | ICD-10-CM | POA: Diagnosis not present

## 2023-07-27 DIAGNOSIS — H6123 Impacted cerumen, bilateral: Secondary | ICD-10-CM | POA: Diagnosis not present

## 2023-07-27 DIAGNOSIS — H93A2 Pulsatile tinnitus, left ear: Secondary | ICD-10-CM | POA: Diagnosis not present

## 2023-07-27 DIAGNOSIS — Z7982 Long term (current) use of aspirin: Secondary | ICD-10-CM | POA: Diagnosis not present

## 2023-07-27 DIAGNOSIS — Z0110 Encounter for examination of ears and hearing without abnormal findings: Secondary | ICD-10-CM | POA: Diagnosis not present

## 2023-07-29 DIAGNOSIS — E119 Type 2 diabetes mellitus without complications: Secondary | ICD-10-CM | POA: Diagnosis not present

## 2023-08-28 DIAGNOSIS — E119 Type 2 diabetes mellitus without complications: Secondary | ICD-10-CM | POA: Diagnosis not present

## 2023-09-03 DIAGNOSIS — L820 Inflamed seborrheic keratosis: Secondary | ICD-10-CM | POA: Diagnosis not present

## 2023-09-03 DIAGNOSIS — D2261 Melanocytic nevi of right upper limb, including shoulder: Secondary | ICD-10-CM | POA: Diagnosis not present

## 2023-09-03 DIAGNOSIS — L304 Erythema intertrigo: Secondary | ICD-10-CM | POA: Diagnosis not present

## 2023-09-03 DIAGNOSIS — L821 Other seborrheic keratosis: Secondary | ICD-10-CM | POA: Diagnosis not present

## 2023-09-03 DIAGNOSIS — L720 Epidermal cyst: Secondary | ICD-10-CM | POA: Diagnosis not present

## 2023-09-06 ENCOUNTER — Ambulatory Visit

## 2023-09-06 ENCOUNTER — Ambulatory Visit
Admission: RE | Admit: 2023-09-06 | Discharge: 2023-09-06 | Disposition: A | Discharge status: Home / Self Care | Source: Ambulatory Visit | Attending: Obstetrics and Gynecology | Admitting: Obstetrics and Gynecology

## 2023-09-06 ENCOUNTER — Ambulatory Visit: Payer: Self-pay | Admitting: Obstetrics and Gynecology

## 2023-09-06 ENCOUNTER — Encounter: Payer: Self-pay | Admitting: Obstetrics and Gynecology

## 2023-09-06 DIAGNOSIS — R921 Mammographic calcification found on diagnostic imaging of breast: Secondary | ICD-10-CM

## 2023-09-06 NOTE — Progress Notes (Signed)
 GYNECOLOGY  VISIT   HPI: 73 y.o.   Married  Caucasian female   [phone] with Patient's last menstrual period was 02/28/1995.   here for: 6 month follow up - L Breast   Some breast discomfort when she does not wear a bra.  Having breast irritation under both breasts.  She has been using a cortisone cream from her dermatologist.  Also has antifungal powder.     Daughter dx with DCIS at age 57 yo.  She will have genetic testing done.  She is looking for a second opinion.  Patient's older sister also dx with DCIS.    Study Result  Narrative & Impression  CLINICAL DATA:  Follow-up of LEFT breast status post biopsy of the LEFT breast with benign concordant results demonstrating fibroadenomatoid change and dense stromal fibrosis.   EXAM: DIGITAL DIAGNOSTIC UNILATERAL LEFT MAMMOGRAM WITH TOMOSYNTHESIS AND CAD   TECHNIQUE: Left digital diagnostic mammography and breast tomosynthesis was performed. The images were evaluated with computer-aided detection.   COMPARISON:  Previous exam(s).   ACR Breast Density Category c: The breasts are heterogeneously dense, which may obscure small masses.   FINDINGS: Diagnostic images of the LEFT breast demonstrate expected post biopsy changes along the X shaped biopsy marking clip in the upper slightly inner breast at posterior depth. Spot magnification views demonstrate a mammographically similar appearance of residual punctate and coarse calcifications inferior to the X shaped biopsy marking clip. No new suspicious findings are noted.   IMPRESSION: Stable mammographic appearance of residual calcifications at subjacent to the X shaped biopsy marking clip.   RECOMMENDATION: Recommend bilateral diagnostic mammogram (with RIGHT and LEFT breast ultrasound if deemed necessary) in January 2026. Patient is due for contralateral screening at this point in time.   I have discussed the findings and recommendations with the patient. If  applicable, a reminder letter will be sent to the patient regarding the next appointment.   BI-RADS CATEGORY  3: Probably benign.     Electronically Signed   By: Corean Salter M.D.   On: 09/06/2023 09:45     Rare use of ETOH.  GYNECOLOGIC HISTORY: Patient's last menstrual period was 02/28/1995. Contraception:  PMP, hyst  Menopausal hormone therapy:  n/a Last 2 paps:  10/16/13 neg, 10/06/10 neg  History of abnormal Pap or positive HPV:  no Mammogram:  09/06/23 (L Breast) Breast Density Cat C, BIRADS Cat 3 Prob benign         OB History     Gravida  3   Para  2   Term  2   Preterm      AB  1   Living  2      SAB      IAB      Ectopic      Multiple      Live Births                 Patient Active Problem List   Diagnosis Date Noted   Malnutrition of moderate degree 01/11/2022   AKI (acute kidney injury) (HCC) 01/09/2022   Hypercalcemia 01/07/2022   Neuropathy 01/03/2022   Low back pain 12/14/2021   Osteoarthritis cervical spine 07/06/2021   Mixed conductive and sensorineural hearing loss of left ear with restricted hearing of right ear 04/07/2021   Pulsatile tinnitus, left ear 02/12/2020   Acute bronchitis 02/10/2014   Smoker 11/12/2013   Uncontrolled type 2 diabetes mellitus with hypoglycemia, with long-term current use of insulin  (HCC) 12/03/2012  Elevated blood pressure reading without diagnosis of hypertension 09/04/2012   OSA (obstructive sleep apnea) 07/27/2011   Insomnia 07/27/2011   Wheezing 07/27/2011   Unspecified adverse effect of unspecified drug, medicinal and biological substance 07/04/2011   Vitamin D  deficiency 12/29/2010   Brachial neuritis or radiculitis 04/21/2010   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   DIVERTICULOSIS, COLON 09/14/2008   DYSMETABOLIC SYNDROME X 09/10/2007   IBS 09/10/2007   Uncontrolled secondary diabetes with peripheral neuropathy 02/04/2007   DEPRESSION 02/04/2007   MIGRAINE HEADACHE 02/04/2007   STRESS  INCONTINENCE 02/04/2007   HISTOPLASMOSIS, HX OF 02/04/2007   LIVER FUNCTION TESTS, ABNORMAL, HX OF 02/04/2007   COLITIS, HX OF 02/04/2007    Past Medical History:  Diagnosis Date   Arthritis    Complication of anesthesia    woke up to soon.  reported bad gag reflex      Diabetes mellitus    Type 2   Diverticulosis    Dysmetabolic syndrome X    Histoplasmosis 1971   IBS (irritable bowel syndrome)    Liver disease    PBC   Migraines    ice pick headaches, Dr. Albin   OSA (obstructive sleep apnea) 07/27/2011   Home sleep study 08/18/11 >> AHI 8.1, SpO2 low 74%.  pt stated she does not have sleep apnes   Primary biliary cholangitis (HCC)    Seasonal allergies    UTI symptoms     Past Surgical History:  Procedure Laterality Date   BREAST BIOPSY Left 03/08/2023   MM LT BREAST BX W LOC DEV 1ST LESION IMAGE BX SPEC STEREO GUIDE 03/08/2023 [facility]   BREAST EXCISIONAL BIOPSY Right    BREAST LUMPECTOMY WITH RADIOACTIVE SEED LOCALIZATION Right 07/22/2015   Procedure: BREAST LUMPECTOMY WITH RADIOACTIVE SEED LOCALIZATION;  Surgeon: Vicenta Poli, MD;  Location: [facility] OR;  Service: General;  Laterality: Right;   COLONOSCOPY     Tics; Dr Obie   DILATION AND CURETTAGE OF UTERUS     Gastric Plication  1981   KNEE ARTHROSCOPY Left 1990   LASER ABLATION OF VASCULAR LESION  2009   Dr. Thaddeus, wart removed from nose   RECTOCELE REPAIR  2003   with vaginal wall repair and sling   ROTATOR CUFF REPAIR Right 2016   TONSILLECTOMY     1982/1992   VAGINAL HYSTERECTOMY  2003   Vag Hyst,BSO,Sling, posterior repair   VULVA SURGERY     keratoses   WRIST SURGERY Left 1988    tendon repair    Current Outpatient Medications  Medication Sig Dispense Refill   aspirin EC 81 MG tablet Take 81 mg by mouth daily.     cetirizine (ZYRTEC) 10 MG tablet Take 10 mg by mouth daily.     glucose blood (ONE TOUCH ULTRA TEST) test strip CHECK BLOOD SUGAR TWICE A DAY AS DIRECTED. 50 each 6    Lancets (ONETOUCH DELICA PLUS LANCET33G) MISC Apply topically 4 (four) times daily.     LINZESS 72 MCG capsule 1 capsule at least 30 minutes before the first meal of the day on an empty stomach Orally Once a day for 30 day(s) (Patient taking differently: As needed)     oxybutynin (DITROPAN XL) 15 MG 24 hr tablet TAKE 1 TABLET DAILY.     OZEMPIC, 1 MG/DOSE, 4 MG/3ML SOPN Inject 1 mg into the skin.     ursodiol  (ACTIGALL ) 300 MG capsule Take one capsule (300 mg) by mouth in the morning and two capsules (600 mg) by mouth in the  evening     zolpidem  (AMBIEN ) 10 MG tablet Take 5 mg by mouth as needed for sleep.      No current facility-administered medications for this visit.     ALLERGIES: Empagliflozin  Family History  Problem Relation Age of Onset   Hyperlipidemia Mother    Multiple sclerosis Mother    Hypertension Mother    Leukemia Father        CLL   Breast cancer Sister        age 35   Hypertension Sister        related to migraines   Migraines Sister    Breast cancer Daughter    Heart disease Maternal Aunt        porcine valve   Heart disease Maternal Uncle        valvular disease   Diabetes Maternal Grandmother        also had malignant lower neck mass of unknown etiology   Stroke Maternal Grandfather 19   Colon cancer Neg Hx    Stomach cancer Neg Hx    Pancreatic cancer Neg Hx    Esophageal cancer Neg Hx     Social History   Socioeconomic History   Marital status: Married    Spouse name: Not on file   Number of children: 2   Years of education: Not on file   Highest education level: Not on file  Occupational History   Occupation: Orthoptist  Tobacco Use   Smoking status: Every Day    Current packs/day: 1.00    Average packs/day: 1 pack/day for 30.0 years (30.0 ttl pk-yrs)    Types: Cigarettes   Smokeless tobacco: Never   Tobacco comments:    Smoked 1968-present; up to 1 ppd  Vaping Use   Vaping status: Former  Substance and Sexual Activity   Alcohol   use: No   Drug use: No   Sexual activity: Not Currently    Birth control/protection: Surgical    Comment: hyst, 1st intercourse 73 yo-Fewer than 5 partners  Other Topics Concern   Not on file  Social History Narrative   Married, lives with spouse Alm   2 children - her son lives in [place] and daughter lives in [place]   OCCUPATION: owns a Copy, 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 : Never true  Transportation Needs: No Transportation Needs (07/27/2023)   Received from [employer]    In the past 12 months, has lack of reliable transportation kept you from medical appointments, meetings, work or from getting things needed for daily living? : No  Physical Activity: Not on file  Stress: Not on file  Social Connections: Not on file  Intimate Partner Violence: Not on file    Review of Systems  See HPI.   PHYSICAL EXAMINATION:   BP 126/82 (BP Location: Right Arm, Patient Position: Sitting)   Pulse 87   LMP 02/28/1995   SpO2 100%     General appearance: alert, cooperative and appears  stated age Neck: no adenopathy, supple,. Lungs: clear to auscultation bilaterally Breasts: normal appearance, no masses or tenderness, No nipple retraction or dimpling, No nipple discharge or bleeding, No axillary or supraclavicular adenopathy.  Minor erythema of the inferior breast along bra line.  Heart: regular rate and rhythm  Chaperone was present for exam:  Kari HERO, CMA  ASSESSMENT:  Fibroadenosis of left breast.  FH of breast cancer. Candida of flexural skin.   PLAN:  Breast  care and imaging schedule reviewed with patient.  Self breast exam also reviewed.  Bilateral dx imaging in January.  We discussed relation between alcohol  use and increased risk of cancer.   Ok for antifungal powder use.  Return for routine breast and pelvic exam in March, 2026.  30 min  total time was spent for this patient encounter, including preparation, face-to-face counseling with the patient, coordination of care, and documentation of the encounter.

## 2023-09-10 ENCOUNTER — Ambulatory Visit: Admitting: Obstetrics and Gynecology

## 2023-09-10 ENCOUNTER — Encounter: Payer: Self-pay | Admitting: Obstetrics and Gynecology

## 2023-09-10 VITALS — BP 126/82 | HR 87

## 2023-09-10 DIAGNOSIS — N6022 Fibroadenosis of left breast: Secondary | ICD-10-CM | POA: Diagnosis not present

## 2023-09-10 DIAGNOSIS — Z803 Family history of malignant neoplasm of breast: Secondary | ICD-10-CM

## 2023-09-10 DIAGNOSIS — B372 Candidiasis of skin and nail: Secondary | ICD-10-CM | POA: Diagnosis not present

## 2023-09-28 DIAGNOSIS — E119 Type 2 diabetes mellitus without complications: Secondary | ICD-10-CM | POA: Diagnosis not present

## 2023-10-29 DIAGNOSIS — E119 Type 2 diabetes mellitus without complications: Secondary | ICD-10-CM | POA: Diagnosis not present

## 2023-11-21 DIAGNOSIS — I10 Essential (primary) hypertension: Secondary | ICD-10-CM | POA: Diagnosis not present

## 2023-11-21 DIAGNOSIS — Z1389 Encounter for screening for other disorder: Secondary | ICD-10-CM | POA: Diagnosis not present

## 2023-11-21 DIAGNOSIS — E1169 Type 2 diabetes mellitus with other specified complication: Secondary | ICD-10-CM | POA: Diagnosis not present

## 2023-11-27 DIAGNOSIS — I10 Essential (primary) hypertension: Secondary | ICD-10-CM | POA: Diagnosis not present

## 2023-11-27 DIAGNOSIS — Z23 Encounter for immunization: Secondary | ICD-10-CM | POA: Diagnosis not present

## 2023-11-27 DIAGNOSIS — R82998 Other abnormal findings in urine: Secondary | ICD-10-CM | POA: Diagnosis not present

## 2023-11-27 DIAGNOSIS — Z1339 Encounter for screening examination for other mental health and behavioral disorders: Secondary | ICD-10-CM | POA: Diagnosis not present

## 2023-11-27 DIAGNOSIS — Z1331 Encounter for screening for depression: Secondary | ICD-10-CM | POA: Diagnosis not present

## 2023-11-27 DIAGNOSIS — Z0000 Encounter for general adult medical examination without abnormal findings: Secondary | ICD-10-CM | POA: Diagnosis not present

## 2023-11-28 DIAGNOSIS — E119 Type 2 diabetes mellitus without complications: Secondary | ICD-10-CM | POA: Diagnosis not present

## 2023-12-29 DIAGNOSIS — E119 Type 2 diabetes mellitus without complications: Secondary | ICD-10-CM | POA: Diagnosis not present

## 2024-01-07 ENCOUNTER — Other Ambulatory Visit: Payer: Self-pay | Admitting: Obstetrics and Gynecology

## 2024-01-07 DIAGNOSIS — R921 Mammographic calcification found on diagnostic imaging of breast: Secondary | ICD-10-CM

## 2024-03-12 ENCOUNTER — Ambulatory Visit: Payer: Self-pay | Admitting: Obstetrics and Gynecology

## 2024-03-12 ENCOUNTER — Ambulatory Visit
Admission: RE | Admit: 2024-03-12 | Discharge: 2024-03-12 | Disposition: A | Discharge status: Home / Self Care | Source: Ambulatory Visit | Attending: Obstetrics and Gynecology | Admitting: Obstetrics and Gynecology

## 2024-03-12 DIAGNOSIS — R921 Mammographic calcification found on diagnostic imaging of breast: Secondary | ICD-10-CM

## 2024-05-22 ENCOUNTER — Encounter: Admitting: Obstetrics and Gynecology
